# Patient Record
Sex: Male | Born: 1955 | Race: White | Hispanic: No | Marital: Married
Health system: Southern US, Community
[De-identification: ages and names within clinical notes are randomized; demographics above are authoritative.]

## PROBLEM LIST (undated history)

## (undated) DIAGNOSIS — E119 Type 2 diabetes mellitus without complications: Secondary | ICD-10-CM

## (undated) DIAGNOSIS — I10 Essential (primary) hypertension: Secondary | ICD-10-CM

## (undated) DIAGNOSIS — I4891 Unspecified atrial fibrillation: Secondary | ICD-10-CM

## (undated) HISTORY — PX: GASTRIC BYPASS: SHX52

---

## 2019-08-04 ENCOUNTER — Ambulatory Visit: Payer: Self-pay

## 2020-11-08 ENCOUNTER — Inpatient Hospital Stay (HOSPITAL_COMMUNITY): Payer: Medicare (Managed Care)

## 2020-11-08 ENCOUNTER — Encounter (HOSPITAL_COMMUNITY): Payer: Self-pay | Admitting: Pulmonary Disease

## 2020-11-08 ENCOUNTER — Inpatient Hospital Stay (HOSPITAL_COMMUNITY)
Admission: AD | Admit: 2020-11-08 | Discharge: 2020-12-02 | DRG: 853 | Disposition: A | Discharge status: Other Acute Inpatient Hospital | Payer: Medicare (Managed Care) | Source: Other Acute Inpatient Hospital | Attending: Family Medicine | Admitting: Family Medicine

## 2020-11-08 DIAGNOSIS — Z6837 Body mass index (BMI) 37.0-37.9, adult: Secondary | ICD-10-CM

## 2020-11-08 DIAGNOSIS — L8990 Pressure ulcer of unspecified site, unspecified stage: Secondary | ICD-10-CM | POA: Insufficient documentation

## 2020-11-08 DIAGNOSIS — R1011 Right upper quadrant pain: Secondary | ICD-10-CM | POA: Diagnosis not present

## 2020-11-08 DIAGNOSIS — L03115 Cellulitis of right lower limb: Secondary | ICD-10-CM | POA: Diagnosis not present

## 2020-11-08 DIAGNOSIS — L03116 Cellulitis of left lower limb: Secondary | ICD-10-CM | POA: Diagnosis not present

## 2020-11-08 DIAGNOSIS — I120 Hypertensive chronic kidney disease with stage 5 chronic kidney disease or end stage renal disease: Secondary | ICD-10-CM | POA: Diagnosis present

## 2020-11-08 DIAGNOSIS — R7989 Other specified abnormal findings of blood chemistry: Secondary | ICD-10-CM

## 2020-11-08 DIAGNOSIS — K810 Acute cholecystitis: Secondary | ICD-10-CM | POA: Diagnosis not present

## 2020-11-08 DIAGNOSIS — Z9884 Bariatric surgery status: Secondary | ICD-10-CM

## 2020-11-08 DIAGNOSIS — E872 Acidosis: Secondary | ICD-10-CM | POA: Diagnosis present

## 2020-11-08 DIAGNOSIS — R1013 Epigastric pain: Secondary | ICD-10-CM

## 2020-11-08 DIAGNOSIS — G928 Other toxic encephalopathy: Secondary | ICD-10-CM | POA: Diagnosis not present

## 2020-11-08 DIAGNOSIS — Z992 Dependence on renal dialysis: Secondary | ICD-10-CM | POA: Diagnosis not present

## 2020-11-08 DIAGNOSIS — E669 Obesity, unspecified: Secondary | ICD-10-CM | POA: Diagnosis present

## 2020-11-08 DIAGNOSIS — M25461 Effusion, right knee: Secondary | ICD-10-CM | POA: Diagnosis not present

## 2020-11-08 DIAGNOSIS — J449 Chronic obstructive pulmonary disease, unspecified: Secondary | ICD-10-CM | POA: Diagnosis present

## 2020-11-08 DIAGNOSIS — A4151 Sepsis due to Escherichia coli [E. coli]: Principal | ICD-10-CM | POA: Diagnosis present

## 2020-11-08 DIAGNOSIS — R52 Pain, unspecified: Secondary | ICD-10-CM

## 2020-11-08 DIAGNOSIS — F05 Delirium due to known physiological condition: Secondary | ICD-10-CM | POA: Diagnosis not present

## 2020-11-08 DIAGNOSIS — E785 Hyperlipidemia, unspecified: Secondary | ICD-10-CM | POA: Diagnosis present

## 2020-11-08 DIAGNOSIS — K8309 Other cholangitis: Secondary | ICD-10-CM | POA: Diagnosis not present

## 2020-11-08 DIAGNOSIS — M009 Pyogenic arthritis, unspecified: Secondary | ICD-10-CM | POA: Diagnosis not present

## 2020-11-08 DIAGNOSIS — R6521 Severe sepsis with septic shock: Secondary | ICD-10-CM | POA: Diagnosis present

## 2020-11-08 DIAGNOSIS — N186 End stage renal disease: Secondary | ICD-10-CM | POA: Diagnosis present

## 2020-11-08 DIAGNOSIS — M79601 Pain in right arm: Secondary | ICD-10-CM | POA: Diagnosis not present

## 2020-11-08 DIAGNOSIS — K219 Gastro-esophageal reflux disease without esophagitis: Secondary | ICD-10-CM | POA: Diagnosis present

## 2020-11-08 DIAGNOSIS — Z4659 Encounter for fitting and adjustment of other gastrointestinal appliance and device: Secondary | ICD-10-CM

## 2020-11-08 DIAGNOSIS — L89150 Pressure ulcer of sacral region, unstageable: Secondary | ICD-10-CM | POA: Diagnosis not present

## 2020-11-08 DIAGNOSIS — N170 Acute kidney failure with tubular necrosis: Secondary | ICD-10-CM | POA: Diagnosis present

## 2020-11-08 DIAGNOSIS — E1122 Type 2 diabetes mellitus with diabetic chronic kidney disease: Secondary | ICD-10-CM | POA: Diagnosis present

## 2020-11-08 DIAGNOSIS — R7881 Bacteremia: Secondary | ICD-10-CM

## 2020-11-08 DIAGNOSIS — A419 Sepsis, unspecified organism: Secondary | ICD-10-CM | POA: Diagnosis not present

## 2020-11-08 DIAGNOSIS — E1165 Type 2 diabetes mellitus with hyperglycemia: Secondary | ICD-10-CM | POA: Diagnosis present

## 2020-11-08 DIAGNOSIS — Z452 Encounter for adjustment and management of vascular access device: Secondary | ICD-10-CM | POA: Diagnosis not present

## 2020-11-08 DIAGNOSIS — D631 Anemia in chronic kidney disease: Secondary | ICD-10-CM | POA: Diagnosis present

## 2020-11-08 DIAGNOSIS — K8001 Calculus of gallbladder with acute cholecystitis with obstruction: Secondary | ICD-10-CM | POA: Diagnosis not present

## 2020-11-08 DIAGNOSIS — E43 Unspecified severe protein-calorie malnutrition: Secondary | ICD-10-CM | POA: Diagnosis present

## 2020-11-08 DIAGNOSIS — Z7984 Long term (current) use of oral hypoglycemic drugs: Secondary | ICD-10-CM

## 2020-11-08 DIAGNOSIS — D6959 Other secondary thrombocytopenia: Secondary | ICD-10-CM | POA: Diagnosis not present

## 2020-11-08 DIAGNOSIS — R945 Abnormal results of liver function studies: Secondary | ICD-10-CM | POA: Diagnosis not present

## 2020-11-08 DIAGNOSIS — Z91120 Patient's intentional underdosing of medication regimen due to financial hardship: Secondary | ICD-10-CM

## 2020-11-08 DIAGNOSIS — M170 Bilateral primary osteoarthritis of knee: Secondary | ICD-10-CM | POA: Diagnosis present

## 2020-11-08 DIAGNOSIS — Z7901 Long term (current) use of anticoagulants: Secondary | ICD-10-CM

## 2020-11-08 DIAGNOSIS — R0902 Hypoxemia: Secondary | ICD-10-CM | POA: Diagnosis present

## 2020-11-08 DIAGNOSIS — K8000 Calculus of gallbladder with acute cholecystitis without obstruction: Secondary | ICD-10-CM | POA: Diagnosis present

## 2020-11-08 DIAGNOSIS — Z7982 Long term (current) use of aspirin: Secondary | ICD-10-CM

## 2020-11-08 DIAGNOSIS — I4891 Unspecified atrial fibrillation: Secondary | ICD-10-CM | POA: Diagnosis present

## 2020-11-08 DIAGNOSIS — M7989 Other specified soft tissue disorders: Secondary | ICD-10-CM | POA: Diagnosis not present

## 2020-11-08 DIAGNOSIS — Z880 Allergy status to penicillin: Secondary | ICD-10-CM

## 2020-11-08 DIAGNOSIS — E876 Hypokalemia: Secondary | ICD-10-CM | POA: Diagnosis not present

## 2020-11-08 DIAGNOSIS — R748 Abnormal levels of other serum enzymes: Secondary | ICD-10-CM | POA: Diagnosis not present

## 2020-11-08 DIAGNOSIS — M25462 Effusion, left knee: Secondary | ICD-10-CM | POA: Diagnosis not present

## 2020-11-08 DIAGNOSIS — R7401 Elevation of levels of liver transaminase levels: Secondary | ICD-10-CM | POA: Diagnosis present

## 2020-11-08 DIAGNOSIS — Z79899 Other long term (current) drug therapy: Secondary | ICD-10-CM

## 2020-11-08 DIAGNOSIS — E871 Hypo-osmolality and hyponatremia: Secondary | ICD-10-CM | POA: Diagnosis present

## 2020-11-08 DIAGNOSIS — Z87891 Personal history of nicotine dependence: Secondary | ICD-10-CM

## 2020-11-08 DIAGNOSIS — R34 Anuria and oliguria: Secondary | ICD-10-CM | POA: Diagnosis not present

## 2020-11-08 DIAGNOSIS — M79661 Pain in right lower leg: Secondary | ICD-10-CM | POA: Diagnosis not present

## 2020-11-08 DIAGNOSIS — N179 Acute kidney failure, unspecified: Secondary | ICD-10-CM

## 2020-11-08 DIAGNOSIS — T45516A Underdosing of anticoagulants, initial encounter: Secondary | ICD-10-CM | POA: Diagnosis present

## 2020-11-08 HISTORY — DX: Unspecified atrial fibrillation: I48.91

## 2020-11-08 HISTORY — DX: Essential (primary) hypertension: I10

## 2020-11-08 HISTORY — DX: Type 2 diabetes mellitus without complications: E11.9

## 2020-11-08 LAB — GLUCOSE, CAPILLARY
Glucose-Capillary: 149 mg/dL — ABNORMAL HIGH (ref 70–99)
Glucose-Capillary: 133 mg/dL — ABNORMAL HIGH (ref 70–99)
Glucose-Capillary: 139 mg/dL — ABNORMAL HIGH (ref 70–99)
Glucose-Capillary: 156 mg/dL — ABNORMAL HIGH (ref 70–99)

## 2020-11-08 LAB — COMPREHENSIVE METABOLIC PANEL
ALT: 348 U/L — ABNORMAL HIGH (ref 0–44)
AST: 392 U/L — ABNORMAL HIGH (ref 15–41)
Albumin: 3.2 g/dL — ABNORMAL LOW (ref 3.5–5.0)
Alkaline Phosphatase: 137 U/L — ABNORMAL HIGH (ref 38–126)
Anion gap: 14 (ref 5–15)
BUN: 28 mg/dL — ABNORMAL HIGH (ref 8–23)
CO2: 18 mmol/L — ABNORMAL LOW (ref 22–32)
Calcium: 8.4 mg/dL — ABNORMAL LOW (ref 8.9–10.3)
Chloride: 105 mmol/L (ref 98–111)
Creatinine, Ser: 1.99 mg/dL — ABNORMAL HIGH (ref 0.61–1.24)
GFR, Estimated: 36 mL/min — ABNORMAL LOW (ref >60)
Glucose, Bld: 136 mg/dL — ABNORMAL HIGH (ref 70–99)
Potassium: 4.5 mmol/L (ref 3.5–5.1)
Sodium: 137 mmol/L (ref 135–145)
Total Bilirubin: 3.2 mg/dL — ABNORMAL HIGH (ref 0.3–1.2)
Total Protein: 6.5 g/dL (ref 6.5–8.1)

## 2020-11-08 LAB — CBC
HCT: 35.9 % — ABNORMAL LOW (ref 39.0–52.0)
Hemoglobin: 11 g/dL — ABNORMAL LOW (ref 13.0–17.0)
MCH: 26.3 pg (ref 26.0–34.0)
MCHC: 30.6 g/dL (ref 30.0–36.0)
MCV: 85.7 fL (ref 80.0–100.0)
Platelets: 145 10*3/uL — ABNORMAL LOW (ref 150–400)
RBC: 4.19 MIL/uL — ABNORMAL LOW (ref 4.22–5.81)
RDW: 16.4 % — ABNORMAL HIGH (ref 11.5–15.5)
WBC: 17.3 10*3/uL — ABNORMAL HIGH (ref 4.0–10.5)
nRBC: 0 % (ref 0.0–0.2)

## 2020-11-08 LAB — PROTIME-INR
INR: 1.4 — ABNORMAL HIGH (ref 0.8–1.2)
Prothrombin Time: 17.3 seconds — ABNORMAL HIGH (ref 11.4–15.2)

## 2020-11-08 LAB — HEMOGLOBIN A1C
Hgb A1c MFr Bld: 8.3 % — ABNORMAL HIGH (ref 4.8–5.6)
Mean Plasma Glucose: 191.51 mg/dL

## 2020-11-08 LAB — HIV ANTIBODY (ROUTINE TESTING W REFLEX): HIV Screen 4th Generation wRfx: NONREACTIVE

## 2020-11-08 LAB — LACTIC ACID, PLASMA: Lactic Acid, Venous: 4.8 mmol/L (ref 0.5–1.9)

## 2020-11-08 LAB — MRSA PCR SCREENING: MRSA by PCR: NEGATIVE

## 2020-11-08 LAB — LIPASE, BLOOD: Lipase: 79 U/L — ABNORMAL HIGH (ref 11–51)

## 2020-11-08 MED ORDER — LACTATED RINGERS IV SOLN: INTRAVENOUS | Status: DC

## 2020-11-08 MED ORDER — NOREPINEPHRINE 4 MG/250ML-% IV SOLN
2.0000 ug/min | INTRAVENOUS | Status: DC
  Administered 2020-11-08: 10 ug/min via INTRAVENOUS
  Filled 2020-11-08: qty 250

## 2020-11-08 MED ORDER — IOHEXOL 9 MG/ML PO SOLN
ORAL | Status: AC
  Administered 2020-11-08: 500 mL
  Filled 2020-11-08: qty 1000

## 2020-11-08 MED ORDER — SODIUM CHLORIDE 0.9 % IV SOLN: 250.0000 mL | INTRAVENOUS | Status: DC

## 2020-11-08 MED ORDER — ORAL CARE MOUTH RINSE
15.0000 mL | Freq: Two times a day (BID) | OROMUCOSAL | Status: DC
  Administered 2020-11-09 – 2020-11-16 (×12): 15 mL via OROMUCOSAL

## 2020-11-08 MED ORDER — PHENYLEPHRINE CONCENTRATED 100MG/250ML (0.4 MG/ML) INFUSION SIMPLE
0.0000 ug/min | INTRAVENOUS | Status: DC
  Administered 2020-11-08: 170 ug/min via INTRAVENOUS
  Administered 2020-11-09: 320 ug/min via INTRAVENOUS
  Administered 2020-11-09: 133.333 ug/min via INTRAVENOUS
  Administered 2020-11-09: 310 ug/min via INTRAVENOUS
  Administered 2020-11-09: 200 ug/min via INTRAVENOUS
  Administered 2020-11-10: 210 ug/min via INTRAVENOUS
  Filled 2020-11-08 (×10): qty 250

## 2020-11-08 MED ORDER — ACETAMINOPHEN 325 MG PO TABS: 650.0000 mg | ORAL_TABLET | ORAL | Status: DC | PRN

## 2020-11-08 MED ORDER — SODIUM CHLORIDE 0.9 % IV BOLUS
1000.0000 mL | Freq: Once | INTRAVENOUS | Status: AC
  Administered 2020-11-08: 1000 mL via INTRAVENOUS

## 2020-11-08 MED ORDER — AMIODARONE HCL IN DEXTROSE 360-4.14 MG/200ML-% IV SOLN
30.0000 mg/h | INTRAVENOUS | Status: DC
  Administered 2020-11-08 – 2020-11-11 (×6): 30 mg/h via INTRAVENOUS
  Filled 2020-11-08 (×6): qty 200

## 2020-11-08 MED ORDER — LACTATED RINGERS IV BOLUS
500.0000 mL | Freq: Once | INTRAVENOUS | Status: AC
  Administered 2020-11-08: 500 mL via INTRAVENOUS

## 2020-11-08 MED ORDER — SODIUM CHLORIDE 0.9 % IV SOLN
2.0000 g | Freq: Two times a day (BID) | INTRAVENOUS | Status: DC
  Administered 2020-11-08 – 2020-11-10 (×5): 2 g via INTRAVENOUS
  Filled 2020-11-08 (×5): qty 2

## 2020-11-08 MED ORDER — PANTOPRAZOLE SODIUM 40 MG IV SOLR
40.0000 mg | INTRAVENOUS | Status: DC
  Administered 2020-11-08 – 2020-11-14 (×7): 40 mg via INTRAVENOUS
  Filled 2020-11-08 (×8): qty 40

## 2020-11-08 MED ORDER — ONDANSETRON HCL 4 MG/2ML IJ SOLN
4.0000 mg | Freq: Four times a day (QID) | INTRAMUSCULAR | Status: DC | PRN
  Administered 2020-11-09 (×2): 4 mg via INTRAVENOUS
  Filled 2020-11-08 (×2): qty 2

## 2020-11-08 MED ORDER — AMIODARONE HCL IN DEXTROSE 360-4.14 MG/200ML-% IV SOLN
60.0000 mg/h | INTRAVENOUS | Status: DC
  Administered 2020-11-08: 60 mg/h via INTRAVENOUS
  Filled 2020-11-08: qty 200

## 2020-11-08 MED ORDER — DOCUSATE SODIUM 100 MG PO CAPS: 100.0000 mg | ORAL_CAPSULE | Freq: Two times a day (BID) | ORAL | Status: DC | PRN

## 2020-11-08 MED ORDER — NOREPINEPHRINE 4 MG/250ML-% IV SOLN
0.0000 ug/min | INTRAVENOUS | Status: DC
  Filled 2020-11-08: qty 250

## 2020-11-08 MED ORDER — HYDROMORPHONE HCL 1 MG/ML IJ SOLN
0.5000 mg | INTRAMUSCULAR | Status: DC | PRN
  Administered 2020-11-08 – 2020-11-13 (×11): 0.5 mg via INTRAVENOUS
  Filled 2020-11-08 (×11): qty 0.5

## 2020-11-08 MED ORDER — ACETAMINOPHEN 650 MG RE SUPP: 650.0000 mg | RECTAL | Status: DC | PRN

## 2020-11-08 MED ORDER — METRONIDAZOLE 500 MG/100ML IV SOLN
500.0000 mg | Freq: Three times a day (TID) | INTRAVENOUS | Status: DC
  Administered 2020-11-08 – 2020-11-10 (×7): 500 mg via INTRAVENOUS
  Filled 2020-11-08 (×7): qty 100

## 2020-11-08 MED ORDER — PHENYLEPHRINE HCL-NACL 10-0.9 MG/250ML-% IV SOLN
25.0000 ug/min | INTRAVENOUS | Status: DC
  Administered 2020-11-08: 25 ug/min via INTRAVENOUS
  Administered 2020-11-08: 13.333 ug/min via INTRAVENOUS
  Filled 2020-11-08 (×3): qty 250

## 2020-11-08 MED ORDER — POLYETHYLENE GLYCOL 3350 17 G PO PACK: 17.0000 g | PACK | Freq: Every day | ORAL | Status: DC | PRN

## 2020-11-08 MED ORDER — ACETAMINOPHEN 325 MG PO TABS: 650.0000 mg | ORAL_TABLET | Freq: Four times a day (QID) | ORAL | Status: DC | PRN

## 2020-11-08 MED ORDER — INSULIN ASPART 100 UNIT/ML IJ SOLN
0.0000 [IU] | INTRAMUSCULAR | Status: DC
  Administered 2020-11-08 (×2): 2 [IU] via SUBCUTANEOUS
  Administered 2020-11-09: 1 [IU] via SUBCUTANEOUS
  Administered 2020-11-09 (×3): 2 [IU] via SUBCUTANEOUS
  Administered 2020-11-09: 3 [IU] via SUBCUTANEOUS
  Administered 2020-11-09 – 2020-11-11 (×4): 2 [IU] via SUBCUTANEOUS
  Administered 2020-11-11: 1 [IU] via SUBCUTANEOUS
  Administered 2020-11-11 – 2020-11-14 (×7): 2 [IU] via SUBCUTANEOUS
  Administered 2020-11-14 (×2): 3 [IU] via SUBCUTANEOUS
  Administered 2020-11-14 – 2020-11-15 (×4): 2 [IU] via SUBCUTANEOUS
  Administered 2020-11-15: 3 [IU] via SUBCUTANEOUS
  Administered 2020-11-15: 2 [IU] via SUBCUTANEOUS
  Administered 2020-11-15: 3 [IU] via SUBCUTANEOUS
  Administered 2020-11-16: 2 [IU] via SUBCUTANEOUS
  Administered 2020-11-16 (×2): 3 [IU] via SUBCUTANEOUS
  Administered 2020-11-16 (×2): 5 [IU] via SUBCUTANEOUS
  Administered 2020-11-16 – 2020-11-17 (×3): 3 [IU] via SUBCUTANEOUS
  Administered 2020-11-17: 5 [IU] via SUBCUTANEOUS
  Administered 2020-11-17: 3 [IU] via SUBCUTANEOUS
  Administered 2020-11-17: 5 [IU] via SUBCUTANEOUS
  Administered 2020-11-17 – 2020-11-18 (×5): 3 [IU] via SUBCUTANEOUS
  Administered 2020-11-18: 2 [IU] via SUBCUTANEOUS
  Administered 2020-11-18: 3 [IU] via SUBCUTANEOUS

## 2020-11-08 MED ORDER — AMIODARONE LOAD VIA INFUSION
150.0000 mg | Freq: Once | INTRAVENOUS | Status: AC
  Administered 2020-11-08: 150 mg via INTRAVENOUS
  Filled 2020-11-08: qty 83.34

## 2020-11-08 MED ORDER — CHLORHEXIDINE GLUCONATE 0.12 % MT SOLN
15.0000 mL | Freq: Two times a day (BID) | OROMUCOSAL | Status: DC
  Administered 2020-11-08 – 2020-11-16 (×13): 15 mL via OROMUCOSAL
  Filled 2020-11-08 (×4): qty 15

## 2020-11-08 MED ORDER — FENTANYL CITRATE (PF) 100 MCG/2ML IJ SOLN: 50.0000 ug | Freq: Once | INTRAMUSCULAR | Status: DC | PRN

## 2020-11-08 NOTE — Consult Note (Signed)
[facility] Consult: 2:08 PM 11/08/2020  LOS: 0 days    Referring Provider: Dr Silas Flood MD Primary Care Physician: Delynn Flavin DO, Tyrone Sage MD in [place]. Primary Gastroenterologist: Althia Forts    Reason for Consultation:  Choledocholithiasis.     HPI: Tom Scott is a 65 y.o. male.  PMH obesity.  DM 2.  GERD.  Tracheobronchitis.  Hypertension.  A. Fib.  COPD, asthma.  Bariatric gastric bypass (Roux-en Y?)  In [place] ~15 to 20 years ago. Previously on Xarelto, stopped March 2022 due to unable to afford.  But now takes 162 mg aspirin daily. No previous EGD, colonoscopy or known GB disease.    Transferred from hospital in [facility] with sepsis picture.  Presented to the ED with epigastric pain for several hours prior to arrival.  Nausea without emesis.  A. fib with RVR.  Hypoxia requiring up to 10 L nasal oxygen.  Hypotensive.   WBCs 11.6.  Hgb 11.6.  MCV 85.  Platelets 237.  INR 1.2 Sodium 134.  BUN/creatinine 23/1. BNP 4989 >> 252.   T bili 1.2 >> 3.  Alk phos 79 >> 137.  AST/ALT 85/35>> 517/314.    Lipase 21.   CXR with congestive heart failure. CTAP w/o contrast showed changes from previous gastric surgery.  Cholelithiasis, no cholecystitis.  Unremarkable liver.  No ductal dilatation. Patient says he had ultrasound of abdomen and this apparently raise concern for choledocholithiasis.  However the transfer paperwork has no ultrasound results.  For 2 weeks pt having intermittent epigastric discomfort but it only lasts a minute or 2.  Not related to eating or fasting, no particular pattern.  No radiation Since arrival in the ED he has had dry heaves but no vomiting.  Last bowel movement was brown, yesterday.  Does not use NSAIDs, just half of a full dose aspirin  daily.   Currently on metronidazole, Levophed, amiodarone IV  No alcohol.  Fm Hx: Breast cancer in his mother.  No family history of ulcer disease, gallbladder disease, liver disease, colorectal cancer.   Prior to Admission medications   Not on File    Scheduled Meds: . amiodarone  150 mg Intravenous Once  . insulin aspart  0-15 Units Subcutaneous Q4H   Infusions: . sodium chloride    . amiodarone     Followed by  . amiodarone    . lactated ringers    . metronidazole    . norepinephrine (LEVOPHED) Adult infusion     PRN Meds: acetaminophen, docusate sodium, ondansetron (ZOFRAN) IV, polyethylene glycol   Allergies as of 11/08/2020  . (Not on File)    No family history on file.  Social History   Socioeconomic History  . Marital status: Married    Spouse name: Not on file  . Number of children: Not on file  . Years of education: Not on file  . Highest education level: Not on file  Occupational History  . Not on file  Tobacco Use  . Smoking status: Not on file  . Smokeless tobacco: Not on  file  Substance and Sexual Activity  . Alcohol use: Not on file  . Drug use: Not on file  . Sexual activity: Not on file  Other Topics Concern  . Not on file  Social History Narrative  . Not on file   Social Determinants of Health   Financial Resource Strain: Not on file  Food Insecurity: Not on file  Transportation Needs: Not on file  Physical Activity: Not on file  Stress: Not on file  Social Connections: Not on file  Intimate Partner Violence: Not on file    REVIEW OF SYSTEMS: Constitutional: Feels tired ENT:  No nose bleeds Pulm: Short of breath periodically.  Not currently profoundly short of breath. CV:  No palpitations, no LE edema.  No angina.   GU:  No hematuria, no frequency GI: See HPI Heme: Denies unusual bleeding or bruising.  Does develop purpura on his forearms readily. Transfusions: No previous blood transfusions. Neuro:  No headaches, no  peripheral tingling or numbness.  No syncope, no seizures. Derm:  No itching, no rash or sores.  Endocrine:  No sweats or chills.  No polyuria or dysuria Immunization: Not queried   PHYSICAL EXAM: Vital signs in last 24 hours: There were no vitals filed for this visit. Wt Readings from Last 3 Encounters:  No data found for Wt    General: Patient is moderately ill-appearing.  Not obviously jaundiced.  Resting comfortably on bed. Head: No facial asymmetry or swelling.  No signs of head trauma. Eyes: No scleral icterus.  Conjunctiva pale. Ears: Not hard of hearing Nose: No discharge or congestion Mouth: Dry tongue.  Mucosa pink, clear.  Fair dentition. Neck: No JVD, masses, thyromegaly Lungs: No labored breathing or cough. Heart: RRR.  Rate in 150s Abdomen: Soft.  Active bowel sounds.  Obese.  Tenderness without guarding or rebound across the upper abdomen, most pronounced in the epigastric region.  No organomegaly, bruits, hernias appreciated.   Rectal: Deferred. Musc/Skeltl: No joint redness, swelling or gross deformity. Extremities: No CCE. Neurologic: Alert.  Oriented x3.  Appropriate.  Moves all 4 limbs without tremor, strength not tested Skin: No jaundice, no telangiectasia. Nodes: No cervical adenopathy Psych: Calm, cooperative.  Intake/Output from previous day: No intake/output data recorded. Intake/Output this shift: No intake/output data recorded.  LAB RESULTS: No results for input(s): WBC, HGB, HCT, PLT in the last 72 hours. BMET No results found for: NA, K, CL, CO2, GLUCOSE, BUN, CREATININE, CALCIUM LFT No results for input(s): PROT, ALBUMIN, AST, ALT, ALKPHOS, BILITOT, BILIDIR, IBILI in the last 72 hours. PT/INR No results found for: INR, PROTIME Hepatitis Panel No results for input(s): HEPBSAG, HCVAB, HEPAIGM, HEPBIGM in the last 72 hours. C-Diff No components found for: CDIFF Lipase  No results found for: LIPASE  Drugs of Abuse  No results found for:  LABOPIA, COCAINSCRNUR, LABBENZ, AMPHETMU, THCU, LABBARB   RADIOLOGY STUDIES: No results found.    IMPRESSION:   *  Epigastric pain.   Elevated LFTs.  Simple cholelithiasis per non-con CT. R/o symptomatic cholelithiasis.  Rule out PUD.  *   Rapid A. Fib.  Hypoxia.   CHF per CXR in [place].    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  *    DM.  *   COVID-19 testing negative in [place].    PLAN:     *   Abdominal ultrasound ordered along with repeat CMET, CBC, PT/INR, A1c, lipase.  *   Added Protonix 40  IV daily.  Cefepime added to Flagyl.     Azucena Freed  11/08/2020, 2:08 PM Phone [phone]

## 2020-11-08 NOTE — Progress Notes (Signed)
Abdominal X-ray to confirm NGT placement recommended advancing several cm deeper into stomach. Advanced NGT ~ 4 cm as recommended by radiology. Verified at bedside with air.   Antonieta Pert, RN  [phone] 11/08/2020

## 2020-11-08 NOTE — Progress Notes (Signed)
Pharmacy Antibiotic Note  Tom Scott is a 65 y.o. male transferred to [facility] on 11/08/2020 with cholelithiasis.  Pharmacy has been consulted for cefepime dosing.  Also on Flagyl.  Labs from OSH - SCr 1.3, CrCL 57 ml/min, WBC WNL  Plan: Cefepime 2gm IV Q12H Flagyl '500mg'$  IV Q8H per MD Monitor renal function, clinical progress    Temp (24hrs), Avg:98.8 F (37.1 C), Min:98.8 F (37.1 C), Max:98.8 F (37.1 C)  No results for input(s): WBC, CREATININE, LATICACIDVEN, VANCOTROUGH, VANCOPEAK, VANCORANDOM, GENTTROUGH, GENTPEAK, GENTRANDOM, TOBRATROUGH, TOBRAPEAK, TOBRARND, AMIKACINPEAK, AMIKACINTROU, AMIKACIN in the last 168 hours.  CrCl cannot be calculated (No successful lab value found.).    Allergies  Allergen Reactions  . Penicillins     LVQ PTA  Flagyl 5/19 >>  Cefepime 5/19 >>  Graycen Sadlon D. Mina Marble, PharmD, BCPS, [place] 11/08/2020, 2:58 PM

## 2020-11-08 NOTE — Procedures (Signed)
Central Venous Catheter Insertion Procedure Note  Tom Scott  [id number]  [birth year]  Date:11/08/20  Time:5:15 PM   Provider Performing:Dianara Smullen R Katerine Morua   Procedure: Insertion of Non-tunneled Central Venous [phone]) with US guidance [id number])   Indication(s) Medication administration  Consent Risks of the procedure as well as the alternatives and risks of each were explained to the patient and/or caregiver.  Consent for the procedure was obtained and is signed in the bedside chart  Anesthesia Topical only with 1% lidocaine   Timeout Verified patient identification, verified procedure, site/side was marked, verified correct patient position, special equipment/implants available, medications/allergies/relevant history reviewed, required imaging and test results available.  Sterile Technique Maximal sterile technique including full sterile barrier drape, hand hygiene, sterile gown, sterile gloves, mask, hair covering, sterile ultrasound probe cover (if used).  Procedure Description Area of catheter insertion was cleaned with chlorhexidine and draped in sterile fashion.  With real-time ultrasound guidance a central venous catheter was placed into the right internal jugular vein. Nonpulsatile blood flow and easy flushing noted in all ports.  The catheter was sutured in place and sterile dressing applied.  Complications/Tolerance None; patient tolerated the procedure well. Chest X-ray is ordered to verify placement for internal jugular or subclavian cannulation.   Chest x-ray is not ordered for femoral cannulation.  EBL Minimal  Specimen(s) None

## 2020-11-08 NOTE — Consult Note (Signed)
[facility] Surgery Consult Note  Tom Scott 08/26/1955  [id number].    Requesting MD: Chesley Mires Chief Complaint/Reason for Consult: abdominal pain, ?choledocholithiasis  HPI:  Tom Scott is a 65yo male PMH HTN, DM, HLD, atrial fibrillation (off xarelto since 08/2020, takes ASA 162.37m qd) [facility]. Patient states that about 2 weeks ago he developed intermittent abdominal pain. The pain became more severe and constant 3 days ago and he went to the ED yesterday. He also reports nausea as well as chills. No fever, emesis.  Patient comes with a paper chart that seems incomplete. He reportedly had an u/s but I cannot find the results. CTA reports gallstones, no signs of cholecystitis. Initial LFTs: AST 85, ALT 35, Alk phos 79, Tbili 1.2. LFTs repeated this morning and rose to AST 517, ALT 314, Alk phos 137, TBili 3. Lipase 21. He was noted to be in rapid atrial fibrillation with hypotension. [facility] He is being started on pressor support. General surgery asked to see.  Abdominal surgical history: gastric bypass ~15 years ago for weight loss Nonsmoker Denies alcohol or illicit drug use Lives at home with his wife Ambulates without assistive device Employment: [employer]  Allergies: penicillin (causes lip swelling)  Review of Systems  Constitutional: Positive for chills.  Respiratory: Negative.   Cardiovascular: Positive for chest pain.  Gastrointestinal: Positive for abdominal pain and nausea. Negative for vomiting.   All systems reviewed and otherwise negative except for as above  No family history on file.  Past Medical History:  Diagnosis Date  . [place]   . Diabetes mellitus ([place]   . Hypertension     Past Surgical History:  Procedure Laterality Date  . GASTRIC BYPASS      Social History:  has no history on file  for tobacco use, alcohol use, and drug use.  Allergies:  Allergies  Allergen Reactions  . Penicillins     No medications prior to admission.    Prior to Admission medications   Not on File    Blood pressure 102/60, pulse (!) 131, temperature 98.8 F (37.1 C), temperature source Oral, resp. rate (!) 28, SpO2 98 %. Physical Exam: General: pleasant, WD/WN male who is laying in bed in NAD HEENT: head is normocephalic, atraumatic.  Sclera are noninjected.  Pupils equal and round.  Ears and nose without any masses or lesions.  Mouth is pink and moist. Dentition fair Heart: tachycardic up to 170s, irregular rhythm  No obvious murmurs, gallops, or rubs noted.  Palpable pedal pulses bilaterally  Lungs: distant breath sounds bilateral bases, CTAB upper lung fields, no wheezes, rhonchi, or rales noted.  Respiratory effort nonlabored Abd: soft, protuberant, ND, no masses, hernias, or organomegaly. Diffusely tender in the upper abdomen with guarding MS: no BUE/BLE edema, calves soft and nontender Skin: warm and dry with no masses, lesions, or rashes Psych: A&Ox4 with an appropriate affect Neuro: cranial nerves grossly intact, equal strength in BUE/BLE bilaterally, normal speech, thought process intact  Results for orders placed or performed during the hospital encounter of 11/08/20 (from the past 48 hour(s))  Glucose, capillary     Status: Abnormal   Collection Time: 11/08/20  1:26 PM  Result Value Ref Range   Glucose-Capillary 149 (H) 70 - 99 mg/dL    Comment: Glucose reference range applies only to samples taken after fasting for at least 8 hours.  No results found.   Anti-infectives (From admission, onward)   Start     Dose/Rate Route Frequency Ordered Stop   11/08/20 1600  ceFEPIme (MAXIPIME) 2 g in sodium chloride 0.9 % 100 mL IVPB        2 g 200 mL/hr over 30 Minutes Intravenous Every 12 hours 11/08/20 1456     11/08/20 1500  metroNIDAZOLE (FLAGYL) IVPB 500 mg        500 mg 100  mL/hr over 60 Minutes Intravenous Every 8 hours 11/08/20 1404         Assessment/Plan HTN DM HLD Atrial fibrillation with RVR - off xarelto since 08/2020, takes ASA 162.12m qd Hx gastric bypass ~15 years ago  Sepsis Epigastric pain Cholelithiasis Elevated LFTs - Patient with 2 weeks of progressive epigastric abdominal pain. Unable to see CT images from outside facility, and do not even see a report for any abdominal u/s. Will start with plain film to ensure no free air given guarding on exam. Abdominal u/s, CBC, CMP, lipase also ordered. Resuscitation per CCM. Gastroenterology on board if he truly does have choledocholithiasis and possible ascending cholangitis. Broad spectrum antibiotics are being started. We will follow.   ID - cefepime/flagyl 5/19>> VTE - SCDs FEN - IVF, NPO Foley - none Follow up - TBD  [facility] 11/08/2020, 2:45 PM Please see Amion for pager number during day hours 7:00am-4:30pm

## 2020-11-08 NOTE — Progress Notes (Addendum)
eLink Physician-Brief Progress Note Patient Name: Tom Scott DOB: 1956-01-15 MRN: [id number]   Date of Service  11/08/2020  HPI/Events of Note  Lactic Acid = 4.8. Last Hgb = 11.0.  eICU Interventions  Plan: 1. Bolus with 0.9 NaCl 1 liter IV over 1 hour now. 2. Monitor CVP now and Q 4 hours.      Intervention Category Major Interventions: Acid-Base disturbance - evaluation and management  Glendal Cassaday Cornelia Copa 11/08/2020, 9:35 PM

## 2020-11-08 NOTE — H&P (Signed)
NAME:  Tom Scott, MRN:  [id number], DOB:  1956-05-15, LOS: 0 ADMISSION DATE:  11/08/2020, CONSULTATION DATE: 11/08/2020  CHIEF COMPLAINT:  Septic shock   History of Present Illness:   13 yoM with a history of atrial fibrillation on xarelto (not taking since 3/22), hypertension, DM, and HLD who initially presented to [facility] in [place] for chest and epigastric pain. Transferred to [facility] due to septic shock and concerns for choledocholithiasis.   He reports 2 weeks of intermittent abdominal pain that worsened 3 days ago. Endorses nausea, denies emesis, fevers, diarrhea. Sign out prior to transfer expressed concern for choledocholithiasis. On arrival, documentaion included a CT abdomen with cholelithiasis no cholecystitis. He also had lactic acidosis, transaminitis and an elevated t bili. He was also with atrial fibrillation with RVR and hypotensive started on pressors. Of note, he stopped taking Xarelto in March 2022 secondary to finances and has been taking aspirin. He   Past Medical History:  Atrial fibrillation HTN DM HLD Hx of gastric bypass   Significant Hospital Events:  - 5/19 admitted to [facility] ICU  Consults:  GI General surgery  Procedures:  - central venous catheter   Significant Diagnostic Tests:  Outside CT abdomen shows cholelithiasis without evidence of acute cholecystitis.   Reportedly had an abdominal US with concern for choledocholithiasis but no record of this  Micro Data:  BC from [facility] and repeated on admission  Antimicrobials:  Metronidazole 5/18> Cefepime> Levaquin 5/18   Interim History / Subjective:   Patient presents from [place] surgery today. Awake and alert, endorsing abdominal and back pain.    Objective   Blood pressure 102/60, pulse (!) 131, temperature 98.8 F (37.1 C), temperature source Oral, resp. rate (!) 28, SpO2 98 %.       No intake or output data in the 24 hours ending 11/08/20  1447 There were no vitals filed for this visit.  Examination: General: elderly male, acutely ill appearing, appears uncomfortable HENT: antiicteric sclerae, dry membranes, on room air  Lungs: CTA bilaterally, normal work of breathing Cardiovascular: s1, s2, tachycardic, no murmurs rubs or gallops Abdomen: soft, bowel sounds present, tenderness to palpation in upper quadrants Extremities: no edema Neuro: alert and oriented x3, following commands Skin: warm and dry  Resolved Hospital Problem list   none  Assessment & Plan:   Septic shock Cholelithiasis, concern for Choledocholithiasis  Epigastric pain Transaminitis - Requiring pressor support, levophed and phenylephrine - Started on flagyl and given one dose of Levaquin prior to admission at [facility] - Will give IVF boluses to facilitate weaning from pressor support - No evidence of acute cholecystis, reported concern for choledocholithiasis  - Transaminitis, tbili 1.2 - Will consult GI and general surgery, appreciate input - Follow up abdominal US, CMP, lipase  Afib w/ RVR - Likely secondary to sepsis  - Amiodarone bolus follow with infusion  - Not taking xarelto since 3/22 secondary to finances, taking aspirin  - Continue telemetry  - Start heparin infusion once procedural plans discussed with GI/Surgery  HTN - Hold home antihypertensives  - Pressor support as above  DM - SSI  - CBG monitoring  HLD - Continue statin once home meds are confirmed      Best practice (evaluated daily)  Diet: NPO Pain/Anxiety/Delirium protocol (if indicated): n/a VAP protocol (if indicated): n/a DVT prophylaxis: SCDs, plan for heparin once procedure plan determined GI prophylaxis: PPI Glucose control: SSI Mobility: bedrest Disposition: ICU  Goals of Care:  Last date  of multidisciplinary goals of care discussion:5/19 Family and staff present: Yes Summary of discussion: continue current plan of care Follow up goals of care  discussion due: n/a Code Status: Full code  Labs   CBC: No results for input(s): WBC, NEUTROABS, HGB, HCT, MCV, PLT in the last 168 hours.  Basic Metabolic Panel: No results for input(s): NA, K, CL, CO2, GLUCOSE, BUN, CREATININE, CALCIUM, MG, PHOS in the last 168 hours. GFR: CrCl cannot be calculated (No successful lab value found.). No results for input(s): PROCALCITON, WBC, LATICACIDVEN in the last 168 hours.  Liver Function Tests: No results for input(s): AST, ALT, ALKPHOS, BILITOT, PROT, ALBUMIN in the last 168 hours. No results for input(s): LIPASE, AMYLASE in the last 168 hours. No results for input(s): AMMONIA in the last 168 hours.  ABG No results found for: PHART, PCO2ART, PO2ART, HCO3, TCO2, ACIDBASEDEF, O2SAT   Coagulation Profile: No results for input(s): INR, PROTIME in the last 168 hours.  Cardiac Enzymes: No results for input(s): CKTOTAL, CKMB, CKMBINDEX, TROPONINI in the last 168 hours.  HbA1C: No results found for: HGBA1C  CBG: Recent Labs  Lab 11/08/20 1326  GLUCAP 149*     Past Medical History:  He,  has a past medical history of Atrial fibrillation ([place]), Diabetes mellitus ([place]), and Hypertension.   Surgical History:   Past Surgical History:  Procedure Laterality Date  . GASTRIC BYPASS       Social History:      Family History:  His family history is not on file.   Allergies Allergies  Allergen Reactions  . Penicillins      Home Medications  Prior to Admission medications   Not on [place], DO PGY-2 IMTS

## 2020-11-09 ENCOUNTER — Inpatient Hospital Stay (HOSPITAL_COMMUNITY): Payer: Medicare (Managed Care)

## 2020-11-09 ENCOUNTER — Encounter (HOSPITAL_COMMUNITY): Payer: Self-pay | Admitting: Pulmonary Disease

## 2020-11-09 DIAGNOSIS — R1013 Epigastric pain: Secondary | ICD-10-CM | POA: Diagnosis not present

## 2020-11-09 DIAGNOSIS — R748 Abnormal levels of other serum enzymes: Secondary | ICD-10-CM | POA: Diagnosis not present

## 2020-11-09 DIAGNOSIS — R6521 Severe sepsis with septic shock: Secondary | ICD-10-CM | POA: Diagnosis not present

## 2020-11-09 DIAGNOSIS — A419 Sepsis, unspecified organism: Secondary | ICD-10-CM | POA: Diagnosis not present

## 2020-11-09 HISTORY — PX: IR PERC CHOLECYSTOSTOMY: IMG2326

## 2020-11-09 LAB — HEPATIC FUNCTION PANEL
ALT: 263 U/L — ABNORMAL HIGH (ref 0–44)
AST: 200 U/L — ABNORMAL HIGH (ref 15–41)
Albumin: 2.6 g/dL — ABNORMAL LOW (ref 3.5–5.0)
Alkaline Phosphatase: 103 U/L (ref 38–126)
Bilirubin, Direct: 2.3 mg/dL — ABNORMAL HIGH (ref 0.0–0.2)
Indirect Bilirubin: 1.2 mg/dL — ABNORMAL HIGH (ref 0.3–0.9)
Total Bilirubin: 3.5 mg/dL — ABNORMAL HIGH (ref 0.3–1.2)
Total Protein: 5.6 g/dL — ABNORMAL LOW (ref 6.5–8.1)

## 2020-11-09 LAB — CBC
HCT: 32.8 % — ABNORMAL LOW (ref 39.0–52.0)
Hemoglobin: 10.1 g/dL — ABNORMAL LOW (ref 13.0–17.0)
MCH: 26.8 pg (ref 26.0–34.0)
MCHC: 30.8 g/dL (ref 30.0–36.0)
MCV: 87 fL (ref 80.0–100.0)
Platelets: 100 10*3/uL — ABNORMAL LOW (ref 150–400)
RBC: 3.77 MIL/uL — ABNORMAL LOW (ref 4.22–5.81)
RDW: 16.9 % — ABNORMAL HIGH (ref 11.5–15.5)
WBC: 22.5 10*3/uL — ABNORMAL HIGH (ref 4.0–10.5)
nRBC: 0 % (ref 0.0–0.2)

## 2020-11-09 LAB — GLUCOSE, CAPILLARY
Glucose-Capillary: 106 mg/dL — ABNORMAL HIGH (ref 70–99)
Glucose-Capillary: 121 mg/dL — ABNORMAL HIGH (ref 70–99)
Glucose-Capillary: 121 mg/dL — ABNORMAL HIGH (ref 70–99)
Glucose-Capillary: 122 mg/dL — ABNORMAL HIGH (ref 70–99)
Glucose-Capillary: 124 mg/dL — ABNORMAL HIGH (ref 70–99)
Glucose-Capillary: 124 mg/dL — ABNORMAL HIGH (ref 70–99)
Glucose-Capillary: 130 mg/dL — ABNORMAL HIGH (ref 70–99)
Glucose-Capillary: 152 mg/dL — ABNORMAL HIGH (ref 70–99)

## 2020-11-09 LAB — LACTIC ACID, PLASMA: Lactic Acid, Venous: 4.5 mmol/L (ref 0.5–1.9)

## 2020-11-09 LAB — BASIC METABOLIC PANEL
Anion gap: 12 (ref 5–15)
BUN: 35 mg/dL — ABNORMAL HIGH (ref 8–23)
CO2: 15 mmol/L — ABNORMAL LOW (ref 22–32)
Calcium: 7.4 mg/dL — ABNORMAL LOW (ref 8.9–10.3)
Chloride: 105 mmol/L (ref 98–111)
Creatinine, Ser: 2.38 mg/dL — ABNORMAL HIGH (ref 0.61–1.24)
GFR, Estimated: 29 mL/min — ABNORMAL LOW (ref >60)
Glucose, Bld: 147 mg/dL — ABNORMAL HIGH (ref 70–99)
Potassium: 4.8 mmol/L (ref 3.5–5.1)
Sodium: 132 mmol/L — ABNORMAL LOW (ref 135–145)

## 2020-11-09 LAB — LIPASE, BLOOD: Lipase: 27 U/L (ref 11–51)

## 2020-11-09 MED ORDER — SODIUM CHLORIDE 0.9% FLUSH
5.0000 mL | Freq: Three times a day (TID) | INTRAVENOUS | Status: DC
  Administered 2020-11-09 – 2020-11-29 (×51): 5 mL

## 2020-11-09 MED ORDER — FENTANYL CITRATE (PF) 100 MCG/2ML IJ SOLN
INTRAMUSCULAR | Status: AC
  Filled 2020-11-09: qty 2

## 2020-11-09 MED ORDER — LIDOCAINE HCL (PF) 1 % IJ SOLN
INTRAMUSCULAR | Status: AC | PRN
  Administered 2020-11-09: 20 mL

## 2020-11-09 MED ORDER — LIDOCAINE HCL (PF) 1 % IJ SOLN
INTRAMUSCULAR | Status: AC
  Filled 2020-11-09: qty 30

## 2020-11-09 MED ORDER — SODIUM CHLORIDE 0.9% FLUSH
10.0000 mL | Freq: Two times a day (BID) | INTRAVENOUS | Status: DC
Start: 2020-11-09 — End: 2020-12-02
  Administered 2020-11-09 – 2020-12-01 (×35): 10 mL

## 2020-11-09 MED ORDER — SODIUM CHLORIDE 0.9% FLUSH
10.0000 mL | INTRAVENOUS | Status: DC | PRN
  Administered 2020-11-21: 10 mL

## 2020-11-09 MED ORDER — IOHEXOL 300 MG/ML  SOLN
50.0000 mL | Freq: Once | INTRAMUSCULAR | Status: AC | PRN
  Administered 2020-11-09: 5 mL

## 2020-11-09 MED ORDER — CHLORHEXIDINE GLUCONATE CLOTH 2 % EX PADS
6.0000 | MEDICATED_PAD | Freq: Every day | CUTANEOUS | Status: DC
  Administered 2020-11-09 – 2020-11-12 (×4): 6 via TOPICAL

## 2020-11-09 MED ORDER — SODIUM CHLORIDE 0.9 % IV BOLUS
1000.0000 mL | Freq: Once | INTRAVENOUS | Status: AC
  Administered 2020-11-09: 1000 mL via INTRAVENOUS

## 2020-11-09 MED ORDER — FENTANYL CITRATE (PF) 100 MCG/2ML IJ SOLN
INTRAMUSCULAR | Status: AC | PRN
  Administered 2020-11-09: 50 ug via INTRAVENOUS

## 2020-11-09 MED ORDER — MIDAZOLAM HCL 2 MG/2ML IJ SOLN
INTRAMUSCULAR | Status: AC | PRN
  Administered 2020-11-09: 1 mg via INTRAVENOUS

## 2020-11-09 MED ORDER — HEPARIN (PORCINE) 25000 UT/250ML-% IV SOLN
3050.0000 [IU]/h | INTRAVENOUS | Status: AC
  Administered 2020-11-09: 1300 [IU]/h via INTRAVENOUS
  Administered 2020-11-11: 1900 [IU]/h via INTRAVENOUS
  Administered 2020-11-11: 2350 [IU]/h via INTRAVENOUS
  Administered 2020-11-12 (×2): 2550 [IU]/h via INTRAVENOUS
  Administered 2020-11-13: 2700 [IU]/h via INTRAVENOUS
  Administered 2020-11-13: 2750 [IU]/h via INTRAVENOUS
  Administered 2020-11-14: 2900 [IU]/h via INTRAVENOUS
  Administered 2020-11-14: 2950 [IU]/h via INTRAVENOUS
  Administered 2020-11-14: 2900 [IU]/h via INTRAVENOUS
  Administered 2020-11-15: 3050 [IU]/h via INTRAVENOUS
  Filled 2020-11-09 (×12): qty 250

## 2020-11-09 MED ORDER — MIDAZOLAM HCL 2 MG/2ML IJ SOLN
INTRAMUSCULAR | Status: AC
  Filled 2020-11-09: qty 4

## 2020-11-09 NOTE — Progress Notes (Signed)
Note ultrasound findings of acute cholecystitis.  No CBD stones or ductal dilatation. No role for MRCP or ERCP. Surgery on board.  Plan perc chole now, lap chole in several weeks GI signing off.    Azucena Freed PA-C

## 2020-11-09 NOTE — Progress Notes (Signed)
ANTICOAGULATION CONSULT NOTE - Initial Consult  Pharmacy Consult for heparin Indication: atrial fibrillation  Allergies  Allergen Reactions  . Penicillamine     Other reaction(s): lips swell  . Penicillins     Other reaction(s): Swelling  Legacy System: CCA Onset Date: <blank> Substance Legacy/Cerner: penicillins / penicillins (Legacy value) Category: Drug Severity Legacy/Cerner: <blank> / Unknown Reaction(s): swelling Comments: <blank>  Legacy System: CCA Onset Date: <blank> Substance Legacy/Cerner: ampicillin / ampicillin (Legacy value) Category: Drug Severity Legacy/Cerner: <blank> / Unknown Reaction(s): swelling Comments: <blank>  Legacy System: CCA Onset Date: <blank> Substance Legacy/Cerner: ampicillin / ampicillin (Legacy value) Category: Drug Severity Legacy/Cerner: <blank> / Unknown Reaction(s): swelling Comments: <blank>  Legacy System: CCA Onset Date: <blank> Substance Legacy/Cerner: penicillins / penicillins (Legacy value) Category: Drug Severity Legacy/Cerner: <blank> / Unknown Reaction(s): swelling Comments: <blank>     Patient Measurements: Height: '5\' 10"'$  (177.8 cm) Weight: 98 kg (216 lb 0.8 oz) IBW/kg (Calculated) : 73 Heparin Dosing Weight: 93.3 kg  Vital Signs: Temp: 99.5 F (37.5 C) (05/20 1533) Temp Source: Axillary (05/20 1533) BP: 104/75 (05/20 1545) Pulse Rate: 116 (05/20 1545)  Labs: Recent Labs    11/08/20 1426 11/09/20 0358  HGB 11.0* 10.1*  HCT 35.9* 32.8*  PLT 145* 100*  LABPROT 17.3*  --   INR 1.4*  --   CREATININE 1.99* 2.38*    Estimated Creatinine Clearance: 35.4 mL/min (A) (by C-G formula based on SCr of 2.38 mg/dL (H)).   Medical History: Past Medical History:  Diagnosis Date  . Atrial fibrillation ([place])   . Diabetes mellitus ([place])   . Hypertension     Medications:  Scheduled:  . chlorhexidine  15 mL Mouth Rinse BID  . Chlorhexidine Gluconate Cloth  6 each Topical Daily  . insulin aspart  0-15  Units Subcutaneous Q4H  . mouth rinse  15 mL Mouth Rinse q12n4p  . pantoprazole (PROTONIX) IV  40 mg Intravenous Q24H  . sodium chloride flush  10-40 mL Intracatheter Q12H  . sodium chloride flush  5 mL Intracatheter Q8H    Assessment: 48 yof presenting with cholelithiasis - now found to be in Afib with RVR.  Underwent perc cholecystomy today - was on Xarelto PTA (but off since March d/t cost).   Hgb 10.1, plt 100. Given procedure has standard bleed risk, will start heparin 6 hours after. No s/sx of bleeding.   Goal of Therapy:  Heparin level 0.3-0.7 units/ml Monitor platelets by anticoagulation protocol: Yes   Plan:  Start heparin infusion at 1300 units/hr on 5/20'@1830'$  Order heparin level in 6 hours after start Monitor daily HL, CBC, and for s/sx of bleeding  Antonietta Jewel, PharmD, [place] Pharmacist  Phone: [phone] 11/09/2020 4:31 PM  Please check AMION for all [place] phone numbers After 10:00 PM, call [place] [phone]

## 2020-11-09 NOTE — Progress Notes (Addendum)
NAME:  Tom Scott, MRN:  [id number], DOB:  1955-09-24, LOS: 1 ADMISSION DATE:  11/08/2020, CONSULTATION DATE: 11/08/2020  CHIEF COMPLAINT:  Septic shock   History of Present Illness:   59 yoM with a history of atrial fibrillation on xarelto (not taking since 3/22), hypertension, DM, and HLD who initially presented to [facility] in [place] for chest and epigastric pain. Transferred to [facility] due to septic shock requiring pressors and concerns for choledocholithiasis.   He reports 2 weeks of intermittent abdominal pain that worsened 3 days ago. Endorses nausea, denies emesis, fevers, diarrhea. Sign out prior to transfer expressed concern for choledocholithiasis. On arrival, documentaion included a CT abdomen with cholelithiasis no cholecystitis. He also had lactic acidosis, transaminitis and an elevated t bili. He was also with atrial fibrillation with RVR and hypotensive started on pressors. Of note, he stopped taking Xarelto in March 2022 secondary to finances and has been taking aspirin. He   Past Medical History:  Atrial fibrillation HTN DM HLD Hx of gastric bypass   Significant Hospital Events:  - 5/19 admitted to [facility] ICU - 5/20 repeat US and CT abdomen showed acute calculous cholecystitis. Plan for percutaneous drain  today  Consults:  GI General surgery  Procedures:  - 5/20 central venous catheter   Significant Diagnostic Tests:  US abdomen and CT abdomen show acute calculous cholecystitis   Micro Data:  BC from [facility] and repeated on admission  Antimicrobials:  Metronidazole 5/18> Cefepime 5/19> Levaquin 5/18   Interim History / Subjective:   Awake and alert, following commands. Continues to have abdominal pain. States he feels tired. Expresses discomfort with the NG tube in place. Denies any history of renal dysfunction.  Objective   Blood pressure 95/72, pulse (!) 125, temperature 98.4 F (36.9 C), temperature source Oral, resp.  rate (!) 34, height '5\' 10"'$  (1.778 m), weight 98 kg, SpO2 97 %. CVP:  [8 mmHg-12 mmHg] 8 mmHg      Intake/Output Summary (Last 24 hours) at 11/09/2020 [id number] Last data filed at 11/09/2020 0500 Gross per 24 hour  Intake 4482.72 ml  Output 440 ml  Net 4042.72 ml   Filed Weights   11/08/20 1600  Weight: 98 kg    Examination: General: elderly male, acutely ill appearing, appears uncomfortable HENT: antiicteric sclerae, dry membranes, on 8L supplemental oxygen Lungs: CTA bilaterally, normal work of breathing Cardiovascular: s1, s2, tachycardic, no murmurs rubs or gallops Abdomen: soft, bowel sounds present, tenderness to palpation in upper quadrants Extremities: no edema Neuro: alert and oriented x3, following commands Skin: warm and dry  Resolved Hospital Problem list   none  Assessment & Plan:   Septic shock Acute calculous cholecystitis  Lactic acidosis  Transaminitis - Levophed stopped, remains on phenylephrine - Repeat abdominal US and CT abdomen show acute calculous cholecystitis - KUB concerning for SBO, NG tube placed to suction - MAP goal >65 - Continue cefepime and metronidazole  - Will give IVF boluses to facilitate weaning from pressor support - GI and general surgery on board, appreciate recommendations - Plan for percutaneous cholecystostomy with IR today; with his worsening renal function may need to consider CO2 vs contrast - Trend LFTs   Afib w/ RVR - Likely secondary to sepsis  - Continue amiodarone - Not taking xarelto since 3/22 secondary to finances, taking aspirin  - Continue telemetry  - Start heparin s/p surgery  AKI - Likely in the setting of septic shock  - Cr 2.4, 1.99 yesterday -  Unclear baseline renal function although reports no hx of renal dysfunction - Trend BMP - Avoid nephrotoxic agents    HTN - Hold home antihypertensives  - Pressor support as above  DM - SSI  - CBG monitoring  HLD - Continue statin once home meds are  confirmed     Best practice (evaluated daily)  Diet: NPO Pain/Anxiety/Delirium protocol (if indicated): IV dilaudid VAP protocol (if indicated): n/a DVT prophylaxis: SCDs, plan for heparin once procedure plan determined GI prophylaxis: PPI Glucose control: SSI Mobility: bedrest Disposition: ICU  Goals of Care:  Last date of multidisciplinary goals of care discussion:5/20 Family and staff present: Yes Summary of discussion: continue current plan of care Follow up goals of care discussion due: n/a Code Status: Full code  Labs   CBC: Recent Labs  Lab 11/08/20 1426 11/09/20 0358  WBC 17.3* 22.5*  HGB 11.0* 10.1*  HCT 35.9* 32.8*  MCV 85.7 87.0  PLT 145* 100*    Basic Metabolic Panel: Recent Labs  Lab 11/08/20 1426 11/09/20 0358  NA 137 132*  K 4.5 4.8  CL 105 105  CO2 18* 15*  GLUCOSE 136* 147*  BUN 28* 35*  CREATININE 1.99* 2.38*  CALCIUM 8.4* 7.4*   GFR: Estimated Creatinine Clearance: 35.4 mL/min (A) (by C-G formula based on SCr of 2.38 mg/dL (H)). Recent Labs  Lab 11/08/20 1426 11/08/20 2035 11/09/20 0105 11/09/20 0358  WBC 17.3*  --   --  22.5*  LATICACIDVEN  --  4.8* 4.5*  --     Liver Function Tests: Recent Labs  Lab 11/08/20 1426 11/09/20 0358  AST 392* 200*  ALT 348* 263*  ALKPHOS 137* 103  BILITOT 3.2* 3.5*  PROT 6.5 5.6*  ALBUMIN 3.2* 2.6*   Recent Labs  Lab 11/08/20 1428 11/09/20 0358  LIPASE 79* 27   No results for input(s): AMMONIA in the last 168 hours.  ABG No results found for: PHART, PCO2ART, PO2ART, HCO3, TCO2, ACIDBASEDEF, O2SAT   Coagulation Profile: Recent Labs  Lab 11/08/20 1426  INR 1.4*    Cardiac Enzymes: No results for input(s): CKTOTAL, CKMB, CKMBINDEX, TROPONINI in the last 168 hours.  HbA1C: Hgb A1c MFr Bld  Date/Time Value Ref Range Status  11/08/2020 02:26 PM 8.3 (H) 4.8 - 5.6 % Final    Comment:    (NOTE) Pre diabetes:          5.7%-6.4%  Diabetes:              >6.4%  Glycemic control  for   <7.0% adults with diabetes     CBG: Recent Labs  Lab 11/08/20 1748 11/08/20 1944 11/08/20 2306 11/09/20 0056 11/09/20 0353  GLUCAP 133* 139* 156* 152* 121*     Past Medical History:  He,  has a past medical history of Atrial fibrillation ([place]), Diabetes mellitus ([place]), and Hypertension.   Surgical History:   Past Surgical History:  Procedure Laterality Date  . GASTRIC BYPASS       Social History:      Family History:  His family history is not on file.   Allergies Allergies  Allergen Reactions  . Penicillins      Home Medications  Prior to Admission medications   Not on [place], DO PGY-2 IMTS

## 2020-11-09 NOTE — Progress Notes (Signed)
eLink Physician-Brief Progress Note Patient Name: Tom Scott DOB: 1956/02/25 MRN: [id number]   Date of Service  11/09/2020  HPI/Events of Note  Lactic Acid level = 4.8 --> 4.5.   eICU Interventions  Plan: 1. Bolus with 0.9 NaCl 1 liter IV over 1 hour now. 2. Nurse to check CVP now.      Intervention Category Major Interventions: Acid-Base disturbance - evaluation and management  [name] 11/09/2020, 2:52 AM

## 2020-11-09 NOTE — Consult Note (Signed)
Chief Complaint: Patient was seen in consultation today for cholecystitis  Referring Physician(s): Dr. Brantley Stage  Supervising Physician: Daryll Brod  Patient Status: [facility] - In-pt  History of Present Illness: Tom Scott is a 65 y.o. male with past medical history of HTN, DM, HLD, a fib (not currently on anticoagulation) who presented from OSH due to concern for acute cholecystitis.  Patient with severe abdominal pain, nausea, and chills.    CT Abdomen Pelvis 11/09/20 showed: IMPRESSION: 1. Findings consistent with acute calculus cholecystitis. No evidence of perforation. No biliary ductal dilation. 2. Bibasilar airspace consolidations may represent atelectasis or pneumonia. 3. Postsurgical changes of gastric bypass. Prominent loops of contrast filled small bowel in the left upper quadrant without abrupt transition to decompressed bowel. Findings may represent Ileus.  IR consulted for percutaneous cholecystostomy tube placement.  Patient is s/p gastric bypass surgery several years ago, not likely ERCP candidate.  May be able to undergo surgical cholecystectomy once more medically stable.  Surgery has consulted and recommends proceeding with cholecystostomy tube.  Patient assessed in ICU.  He is stable on pressor support and O2.  He has an NGT in place which can be suctioned if needed.  Wife at bedside.  They are in agreement to proceed with drain placement today.   Past Medical History:  Diagnosis Date  . Atrial fibrillation ([place])   . Diabetes mellitus ([place])   . Hypertension     Past Surgical History:  Procedure Laterality Date  . GASTRIC BYPASS      Allergies: Penicillins  Medications: Prior to Admission medications   Not on File     History reviewed. No pertinent family history.  Social History   Socioeconomic History  . Marital status: Married    Spouse name: Not on file  . Number of children: Not on file  . Years of education: Not on file  . Highest  education level: Not on file  Occupational History  . Not on file  Tobacco Use  . Smoking status: Not on file  . Smokeless tobacco: Not on file  Substance and Sexual Activity  . Alcohol use: Not on file  . Drug use: Not on file  . Sexual activity: Not on file  Other Topics Concern  . Not on file  Social History Narrative  . Not on file   Social Determinants of Health   Financial Resource Strain: Not on file  Food Insecurity: Not on file  Transportation Needs: Not on file  Physical Activity: Not on file  Stress: Not on file  Social Connections: Not on file     Review of Systems: A 12 point ROS discussed and pertinent positives are indicated in the HPI above.  All other systems are negative.  Review of Systems  Constitutional: Negative for fatigue and fever.  Respiratory: Negative for cough and shortness of breath.   Cardiovascular: Negative for chest pain.  Gastrointestinal: Positive for abdominal pain, nausea and vomiting.  Musculoskeletal: Negative for back pain.  Psychiatric/Behavioral: Negative for behavioral problems and confusion.    Vital Signs: BP 101/60   Pulse (!) 109   Temp 98.3 F (36.8 C) (Oral)   Resp (!) 23   Ht 5' 10"  (1.778 m)   Wt 216 lb 0.8 oz (98 kg)   SpO2 99%   BMI 31.00 kg/m   Physical Exam Vitals and nursing note reviewed.  Constitutional:      General: He is not in acute distress.    Appearance: Normal appearance. He is  ill-appearing.  HENT:     Mouth/Throat:     Mouth: Mucous membranes are moist.     Pharynx: Oropharynx is clear.  Cardiovascular:     Rate and Rhythm: Normal rate and regular rhythm.  Pulmonary:     Effort: Pulmonary effort is normal. No respiratory distress.     Breath sounds: Normal breath sounds.     Comments: Nasal cannula Skin:    General: Skin is warm and dry.  Neurological:     General: No focal deficit present.     Mental Status: He is alert and oriented to person, place, and time.  Psychiatric:         Mood and Affect: Mood normal.        Behavior: Behavior normal.        Thought Content: Thought content normal.        Judgment: Judgment normal.      MD Evaluation Airway: WNL Heart: WNL Abdomen: WNL Chest/ Lungs: WNL ASA  Classification: 3 Mallampati/Airway Score: Two   Imaging: CT ABDOMEN PELVIS WO CONTRAST  Result Date: 11/09/2020 CLINICAL DATA:  Nausea and vomiting EXAM: CT ABDOMEN AND PELVIS WITHOUT CONTRAST TECHNIQUE: Multidetector CT imaging of the abdomen and pelvis was performed following the standard protocol without IV contrast. COMPARISON:  Abdominal ultrasound Nov 08, 2020. FINDINGS: Lower chest: Evaluation of the lung bases is limited by respiratory motion. Bibasilar airspace consolidations. Partially visualized central venous catheter with tip overlying the superior cavoatrial junction. Coronary artery calcifications. Hepatobiliary: Unremarkable noncontrast appearance of the hepatic parenchyma. No portal venous gas. Gallbladder is distended with layering cholelithiasis wall thickening and pericholecystic fluid consistent with acute calculus cholecystitis. No biliary ductal dilation. Pancreas: Fatty replacement of the pancreatic parenchyma. No pancreatic ductal dilation. Spleen: Within normal limits. Adrenals/Urinary Tract: Bilateral adrenal glands are unremarkable. No hydronephrosis. No contour deforming renal masses. Urinary bladder is decompressed around a Foley catheter. Stomach/Bowel: Postsurgical changes of gastric bypass. Nasogastric tube with tip in the Roux limb. Prominent loops of contrast filled small bowel in the left upper quadrant without abrupt transition to decompressed bowel. Appendix not visualized. No colonic wall or adjacent inflammation. Vascular/Lymphatic: IVC filter. Aortic atherosclerosis without aneurysmal dilation. No pathologically enlarged abdominal or pelvic lymph nodes. Reproductive: Prostate is unremarkable. Other: Pericholecystic predominant right  upper quadrant stranding with trace fluid in the right pericolic gutter without walled off collection. No pneumoperitoneum. Musculoskeletal: Multilevel degenerative changes spine with bony ankylosis of the thoracolumbar spine. No acute osseous abnormality. IMPRESSION: 1. Findings consistent with acute calculus cholecystitis. No evidence of perforation. No biliary ductal dilation. 2. Bibasilar airspace consolidations may represent atelectasis or pneumonia. 3. Postsurgical changes of gastric bypass. Prominent loops of contrast filled small bowel in the left upper quadrant without abrupt transition to decompressed bowel. Findings may represent ileus. 4. Aortic atherosclerosis. Aortic Atherosclerosis (ICD10-I70.0). Electronically Signed   By: Dahlia Bailiff MD   On: 11/09/2020 00:36   DG CHEST PORT 1 VIEW  Result Date: 11/08/2020 CLINICAL DATA:  Central line placement EXAM: PORTABLE CHEST 1 VIEW COMPARISON:  None. FINDINGS: Right IJ approach central venous catheter tip terminates near the superior cavoatrial junction. Notable cardiomegaly though may be somewhat accentuated by portable technique and low volumes. Calcified aorta. Low lung volumes and atelectatic changes including more bandlike areas of subsegmental atelectasis towards the bases. Additional hazy and patchy opacities could reflect further atelectatic volume loss though early airspace disease is difficult to exclude. Suspect at least small bilateral effusions. No pneumothorax. No acute osseous or soft  tissue abnormality. Telemetry leads overlie the chest. IMPRESSION: Right IJ approach central venous catheter tip terminates at the superior cavoatrial junction. Low volumes and atelectasis including more bandlike subsegmental atelectasis in the bases. Patchy opacities could reflect further volume loss or early airspace disease. Suspect small bilateral effusions. Enlarged cardiac silhouette though may be accentuated by portable technique. Electronically  Signed   By: Lovena Le M.D.   On: 11/08/2020 16:36   DG Abd Portable 1V  Result Date: 11/08/2020 CLINICAL DATA:  Check gastric catheter placement EXAM: PORTABLE ABDOMEN - 1 VIEW COMPARISON:  Film from earlier in the same day. FINDINGS: Gastric catheter is noted within the stomach. Proximal side port however is noted in the distal esophagus. This should be advanced several cm deeper into the stomach. Multiple dilated loops of small bowel are again identified consistent with at least partial small bowel obstruction. IMPRESSION: Persistent small bowel dilatation. Gastric catheter as described. This should be advanced deeper into the stomach. Electronically Signed   By: Inez Catalina M.D.   On: 11/08/2020 21:53   DG Abd Portable 1V  Result Date: 11/08/2020 CLINICAL DATA:  Abdominal pain and distention. EXAM: PORTABLE ABDOMEN - 1 VIEW COMPARISON:  No prior. FINDINGS: Surgical clips right upper quadrant and pelvis. Multiple dilated loops of small bowel noted. Relative paucity of colonic gas. Findings suggest small bowel obstruction. Follow-up abdominal series suggested for further evaluation. It is difficult to evaluate for free air as hemidiaphragms incompletely imaged. IVC filter noted. Degenerative change lumbar spine. IMPRESSION: Multiple dilated loops of small bowel noted. Relative paucity of colonic gas. Findings suggest small bowel obstruction. Follow-up abdominal series suggested for further evaluation. Electronically Signed   By: Marcello Moores  Register   On: 11/08/2020 16:35   US Abdomen Limited RUQ (LIVER/GB)  Result Date: 11/08/2020 CLINICAL DATA:  Right upper quadrant abdominal pain EXAM: ULTRASOUND ABDOMEN LIMITED RIGHT UPPER QUADRANT COMPARISON:  None. FINDINGS: Gallbladder: The gallbladder is mildly distended and contains numerous layering gravel-like gallstones. There is mild pericholecystic fluid identified along the hepatics surface as well as mild gallbladder wall thickening noted. The  sonographic Percell Miller sign is reportedly positive. Altogether, the findings are in keeping with changes of acute calculus cholecystitis. Common bile duct: Diameter: 4 mm Liver: No focal lesion identified. Within normal limits in parenchymal echogenicity. Portal vein is patent on color Doppler imaging with normal direction of blood flow towards the liver. Other: No ascites IMPRESSION: Acute calculus cholecystitis. No intra or extrahepatic biliary ductal dilation. Electronically Signed   By: Fidela Salisbury MD   On: 11/08/2020 22:20    Labs:  CBC: Recent Labs    11/08/20 1426 11/09/20 0358  WBC 17.3* 22.5*  HGB 11.0* 10.1*  HCT 35.9* 32.8*  PLT 145* 100*    COAGS: Recent Labs    11/08/20 1426  INR 1.4*    BMP: Recent Labs    11/08/20 1426 11/09/20 0358  NA 137 132*  K 4.5 4.8  CL 105 105  CO2 18* 15*  GLUCOSE 136* 147*  BUN 28* 35*  CALCIUM 8.4* 7.4*  CREATININE 1.99* 2.38*  GFRNONAA 36* 29*    LIVER FUNCTION TESTS: Recent Labs    11/08/20 1426 11/09/20 0358  BILITOT 3.2* 3.5*  AST 392* 200*  ALT 348* 263*  ALKPHOS 137* 103  PROT 6.5 5.6*  ALBUMIN 3.2* 2.6*    TUMOR MARKERS: No results for input(s): AFPTM, CEA, CA199, CHROMGRNA in the last 8760 hours.  Assessment and Plan: Acute cholecystitis Patient transferred from OSH  with concern for acute cholecystitis and septic shock.  He is on pressure support, IV antibiotics and amiodarone infusion for a fib.  CT reviewed by Dr. Annamaria Boots who approves for procedure today.  PA met with patient and wife at bedside. They are agreeable to procedure today.  He has been NPO with NGT in place.  Not on anticoagulation.   Risks and benefits discussed with the patient including, but not limited to bleeding, infection, gallbladder perforation, bile leak, sepsis or even death.  All of the patient's questions were answered, patient is agreeable to proceed. Consent signed and in chart.    Thank you for this interesting consult.   I greatly enjoyed meeting Faris Coolman and look forward to participating in their care.  A copy of this report was sent to the requesting provider on this date.  Electronically Signed: Docia Barrier, PA 11/09/2020, 11:05 AM   I spent a total of 40 Minutes    in face to face in clinical consultation, greater than 50% of which was counseling/coordinating care for acute cholecystitis.

## 2020-11-09 NOTE — Progress Notes (Signed)
Subjective: CC: Epigastric abdominal pain Patient continues to have moderate to severe epigastric abdominal pain that radiates to his RUQ > LUQ. Reports associated nausea. NGT in place. Denies flatus. Foley placed yesterday. Having some sob. Levo stopped overnight. Still on phenylephrine.   Objective: Vital signs in last 24 hours: Temp:  [98.2 F (36.8 C)-99 F (37.2 C)] 98.4 F (36.9 C) (05/20 0400) Pulse Rate:  [106-150] 125 (05/20 0530) Resp:  [23-35] 34 (05/20 0530) BP: (72-124)/(50-80) 95/72 (05/20 0530) SpO2:  [89 %-100 %] 97 % (05/20 0530) Weight:  [98 kg] 98 kg (05/19 1600) Last BM Date: 11/07/20  Intake/Output from previous day: 05/19 0701 - 05/20 0700 In: 4482.7 [I.V.:2893.9; IV Piggyback:1588.8] Out: 440 [Urine:440] Intake/Output this shift: No intake/output data recorded.  PE: Gen:  Alert, NAD, pleasant HEENT: EOM's intact, pupils equal and round Card:  Tachycardic with irregular rhythm  Pulm:  Distant breath sounds at the bases b/l. Clear to upper lung fields. On o2. Tachypnea noted. No accessory muscle use.  Abd: Mild distended but soft with tenderness of the upper abdomen diffusely greatest in the epigastrium with guarding. Hypoactive bowel sounds.  Psych: A&Ox3  Skin: no rashes noted, warm and dry   Lab Results:  Recent Labs    11/08/20 1426 11/09/20 0358  WBC 17.3* 22.5*  HGB 11.0* 10.1*  HCT 35.9* 32.8*  PLT 145* 100*   BMET Recent Labs    11/08/20 1426 11/09/20 0358  NA 137 132*  K 4.5 4.8  CL 105 105  CO2 18* 15*  GLUCOSE 136* 147*  BUN 28* 35*  CREATININE 1.99* 2.38*  CALCIUM 8.4* 7.4*   PT/INR Recent Labs    11/08/20 1426  LABPROT 17.3*  INR 1.4*   CMP     Component Value Date/Time   NA 132 (L) 11/09/2020 0358   K 4.8 11/09/2020 0358   CL 105 11/09/2020 0358   CO2 15 (L) 11/09/2020 0358   GLUCOSE 147 (H) 11/09/2020 0358   BUN 35 (H) 11/09/2020 0358   CREATININE 2.38 (H) 11/09/2020 0358   CALCIUM 7.4 (L)  11/09/2020 0358   PROT 5.6 (L) 11/09/2020 0358   ALBUMIN 2.6 (L) 11/09/2020 0358   AST 200 (H) 11/09/2020 0358   ALT 263 (H) 11/09/2020 0358   ALKPHOS 103 11/09/2020 0358   BILITOT 3.5 (H) 11/09/2020 0358   GFRNONAA 29 (L) 11/09/2020 0358   Lipase     Component Value Date/Time   LIPASE 27 11/09/2020 0358       Studies/Results: CT ABDOMEN PELVIS WO CONTRAST  Result Date: 11/09/2020 CLINICAL DATA:  Nausea and vomiting EXAM: CT ABDOMEN AND PELVIS WITHOUT CONTRAST TECHNIQUE: Multidetector CT imaging of the abdomen and pelvis was performed following the standard protocol without IV contrast. COMPARISON:  Abdominal ultrasound Nov 08, 2020. FINDINGS: Lower chest: Evaluation of the lung bases is limited by respiratory motion. Bibasilar airspace consolidations. Partially visualized central venous catheter with tip overlying the superior cavoatrial junction. Coronary artery calcifications. Hepatobiliary: Unremarkable noncontrast appearance of the hepatic parenchyma. No portal venous gas. Gallbladder is distended with layering cholelithiasis wall thickening and pericholecystic fluid consistent with acute calculus cholecystitis. No biliary ductal dilation. Pancreas: Fatty replacement of the pancreatic parenchyma. No pancreatic ductal dilation. Spleen: Within normal limits. Adrenals/Urinary Tract: Bilateral adrenal glands are unremarkable. No hydronephrosis. No contour deforming renal masses. Urinary bladder is decompressed around a Foley catheter. Stomach/Bowel: Postsurgical changes of gastric bypass. Nasogastric tube with tip in the Roux limb. Prominent loops  of contrast filled small bowel in the left upper quadrant without abrupt transition to decompressed bowel. Appendix not visualized. No colonic wall or adjacent inflammation. Vascular/Lymphatic: IVC filter. Aortic atherosclerosis without aneurysmal dilation. No pathologically enlarged abdominal or pelvic lymph nodes. Reproductive: Prostate is  unremarkable. Other: Pericholecystic predominant right upper quadrant stranding with trace fluid in the right pericolic gutter without walled off collection. No pneumoperitoneum. Musculoskeletal: Multilevel degenerative changes spine with bony ankylosis of the thoracolumbar spine. No acute osseous abnormality. IMPRESSION: 1. Findings consistent with acute calculus cholecystitis. No evidence of perforation. No biliary ductal dilation. 2. Bibasilar airspace consolidations may represent atelectasis or pneumonia. 3. Postsurgical changes of gastric bypass. Prominent loops of contrast filled small bowel in the left upper quadrant without abrupt transition to decompressed bowel. Findings may represent ileus. 4. Aortic atherosclerosis. Aortic Atherosclerosis (ICD10-I70.0). Electronically Signed   By: Dahlia Bailiff MD   On: 11/09/2020 00:36   DG CHEST PORT 1 VIEW  Result Date: 11/08/2020 CLINICAL DATA:  Central line placement EXAM: PORTABLE CHEST 1 VIEW COMPARISON:  None. FINDINGS: Right IJ approach central venous catheter tip terminates near the superior cavoatrial junction. Notable cardiomegaly though may be somewhat accentuated by portable technique and low volumes. Calcified aorta. Low lung volumes and atelectatic changes including more bandlike areas of subsegmental atelectasis towards the bases. Additional hazy and patchy opacities could reflect further atelectatic volume loss though early airspace disease is difficult to exclude. Suspect at least small bilateral effusions. No pneumothorax. No acute osseous or soft tissue abnormality. Telemetry leads overlie the chest. IMPRESSION: Right IJ approach central venous catheter tip terminates at the superior cavoatrial junction. Low volumes and atelectasis including more bandlike subsegmental atelectasis in the bases. Patchy opacities could reflect further volume loss or early airspace disease. Suspect small bilateral effusions. Enlarged cardiac silhouette though may be  accentuated by portable technique. Electronically Signed   By: Lovena Le M.D.   On: 11/08/2020 16:36   DG Abd Portable 1V  Result Date: 11/08/2020 CLINICAL DATA:  Check gastric catheter placement EXAM: PORTABLE ABDOMEN - 1 VIEW COMPARISON:  Film from earlier in the same day. FINDINGS: Gastric catheter is noted within the stomach. Proximal side port however is noted in the distal esophagus. This should be advanced several cm deeper into the stomach. Multiple dilated loops of small bowel are again identified consistent with at least partial small bowel obstruction. IMPRESSION: Persistent small bowel dilatation. Gastric catheter as described. This should be advanced deeper into the stomach. Electronically Signed   By: Inez Catalina M.D.   On: 11/08/2020 21:53   DG Abd Portable 1V  Result Date: 11/08/2020 CLINICAL DATA:  Abdominal pain and distention. EXAM: PORTABLE ABDOMEN - 1 VIEW COMPARISON:  No prior. FINDINGS: Surgical clips right upper quadrant and pelvis. Multiple dilated loops of small bowel noted. Relative paucity of colonic gas. Findings suggest small bowel obstruction. Follow-up abdominal series suggested for further evaluation. It is difficult to evaluate for free air as hemidiaphragms incompletely imaged. IVC filter noted. Degenerative change lumbar spine. IMPRESSION: Multiple dilated loops of small bowel noted. Relative paucity of colonic gas. Findings suggest small bowel obstruction. Follow-up abdominal series suggested for further evaluation. Electronically Signed   By: Marcello Moores  Register   On: 11/08/2020 16:35   US Abdomen Limited RUQ (LIVER/GB)  Result Date: 11/08/2020 CLINICAL DATA:  Right upper quadrant abdominal pain EXAM: ULTRASOUND ABDOMEN LIMITED RIGHT UPPER QUADRANT COMPARISON:  None. FINDINGS: Gallbladder: The gallbladder is mildly distended and contains numerous layering gravel-like gallstones. There is mild  pericholecystic fluid identified along the hepatics surface as well as  mild gallbladder wall thickening noted. The sonographic Percell Miller sign is reportedly positive. Altogether, the findings are in keeping with changes of acute calculus cholecystitis. Common bile duct: Diameter: 4 mm Liver: No focal lesion identified. Within normal limits in parenchymal echogenicity. Portal vein is patent on color Doppler imaging with normal direction of blood flow towards the liver. Other: No ascites IMPRESSION: Acute calculus cholecystitis. No intra or extrahepatic biliary ductal dilation. Electronically Signed   By: Fidela Salisbury MD   On: 11/08/2020 22:20    Anti-infectives: Anti-infectives (From admission, onward)   Start     Dose/Rate Route Frequency Ordered Stop   11/08/20 1600  ceFEPIme (MAXIPIME) 2 g in sodium chloride 0.9 % 100 mL IVPB        2 g 200 mL/hr over 30 Minutes Intravenous Every 12 hours 11/08/20 1456     11/08/20 1500  metroNIDAZOLE (FLAGYL) IVPB 500 mg        500 mg 100 mL/hr over 60 Minutes Intravenous Every 8 hours 11/08/20 1404         Assessment/Plan HTN DM HLD Atrial fibrillation - off xarelto since 08/2020, takes ASA 162.'5mg'$  qd. Currently on amio gtt Hx gastric bypass ~15 years ago AKI - Cr 2.38. Discussed with family that there is risk of worsening kidney function w/ contrast studies/procedure. I have discussed with CCM and they are going to evaluate this to determine if nephrology consult is needed per pop-up for GFR < 30 when ordering perc chole.   Sepsis 2/2 Acute Calculus Cholecystitis  Elevated LFTs - CT A/P and RUQ w/ evidence of cholecystitis. Patient remains on pressors. Will ask IR team to evaluation for Percutaneous Cholecystostomy drainage. Spoke with Dr. Annamaria Boots of IR.  - LFT's elevated w/ AST 200, ALT 263, and T Bili 3.5. Suspect 2/2 Cholecystitis but continue to trend. No evidence of CBD dilation or choledocholithiasis on CT or [place]. If continues to rise after Perc Chole, could consider MRCP.  - Continue IV abx  - Discussed with family  that perc chole will likely remain in place for at least 6 weeks before possible elective cholecystectomy as outpatient - Appears to have ileus on xray and CT. Currently with NGT. Continue until ROBF - We will follow with you  ID - cefepime/flagyl 5/19>> VTE - SCDs, per primary team FEN - IVF, NPO, NGT Foley - in place, per primary  Follow up - TBD   LOS: 1 day    Jillyn Ledger , [facility] 11/09/2020, 7:35 AM Please see Amion for pager number during day hours 7:00am-4:30pm

## 2020-11-09 NOTE — Procedures (Signed)
Interventional Radiology Procedure Note  Procedure: [place] and fluoro perc cholecystostomy    Complications: None  Estimated Blood Loss:  0  Findings: Purulent bile asp cx sent    Tamera Punt, MD

## 2020-11-10 DIAGNOSIS — R6521 Severe sepsis with septic shock: Secondary | ICD-10-CM | POA: Diagnosis not present

## 2020-11-10 DIAGNOSIS — A419 Sepsis, unspecified organism: Secondary | ICD-10-CM | POA: Diagnosis not present

## 2020-11-10 LAB — COMPREHENSIVE METABOLIC PANEL
ALT: 199 U/L — ABNORMAL HIGH (ref 0–44)
AST: 125 U/L — ABNORMAL HIGH (ref 15–41)
Albumin: 2.6 g/dL — ABNORMAL LOW (ref 3.5–5.0)
Alkaline Phosphatase: 125 U/L (ref 38–126)
Anion gap: 13 (ref 5–15)
BUN: 48 mg/dL — ABNORMAL HIGH (ref 8–23)
CO2: 16 mmol/L — ABNORMAL LOW (ref 22–32)
Calcium: 7.5 mg/dL — ABNORMAL LOW (ref 8.9–10.3)
Chloride: 105 mmol/L (ref 98–111)
Creatinine, Ser: 3.38 mg/dL — ABNORMAL HIGH (ref 0.61–1.24)
GFR, Estimated: 19 mL/min — ABNORMAL LOW (ref >60)
Glucose, Bld: 109 mg/dL — ABNORMAL HIGH (ref 70–99)
Potassium: 4.4 mmol/L (ref 3.5–5.1)
Sodium: 134 mmol/L — ABNORMAL LOW (ref 135–145)
Total Bilirubin: 3.5 mg/dL — ABNORMAL HIGH (ref 0.3–1.2)
Total Protein: 5.7 g/dL — ABNORMAL LOW (ref 6.5–8.1)

## 2020-11-10 LAB — GLUCOSE, CAPILLARY
Glucose-Capillary: 121 mg/dL — ABNORMAL HIGH (ref 70–99)
Glucose-Capillary: 88 mg/dL (ref 70–99)
Glucose-Capillary: 109 mg/dL — ABNORMAL HIGH (ref 70–99)
Glucose-Capillary: 109 mg/dL — ABNORMAL HIGH (ref 70–99)
Glucose-Capillary: 104 mg/dL — ABNORMAL HIGH (ref 70–99)
Glucose-Capillary: 113 mg/dL — ABNORMAL HIGH (ref 70–99)

## 2020-11-10 LAB — CBC
HCT: 31.7 % — ABNORMAL LOW (ref 39.0–52.0)
Hemoglobin: 10.2 g/dL — ABNORMAL LOW (ref 13.0–17.0)
MCH: 27.3 pg (ref 26.0–34.0)
MCHC: 32.2 g/dL (ref 30.0–36.0)
MCV: 85 fL (ref 80.0–100.0)
Platelets: 92 10*3/uL — ABNORMAL LOW (ref 150–400)
RBC: 3.73 MIL/uL — ABNORMAL LOW (ref 4.22–5.81)
RDW: 17.2 % — ABNORMAL HIGH (ref 11.5–15.5)
WBC: 21 10*3/uL — ABNORMAL HIGH (ref 4.0–10.5)
nRBC: 0 % (ref 0.0–0.2)

## 2020-11-10 LAB — HEPARIN LEVEL (UNFRACTIONATED)
Heparin Unfractionated: 0.14 IU/mL — ABNORMAL LOW (ref 0.30–0.70)
Heparin Unfractionated: 0.11 IU/mL — ABNORMAL LOW (ref 0.30–0.70)
Heparin Unfractionated: 0.2 IU/mL — ABNORMAL LOW (ref 0.30–0.70)

## 2020-11-10 LAB — LACTIC ACID, PLASMA: Lactic Acid, Venous: 0.8 mmol/L (ref 0.5–1.9)

## 2020-11-10 MED ORDER — HEPARIN BOLUS VIA INFUSION
2000.0000 [IU] | Freq: Once | INTRAVENOUS | Status: AC
  Administered 2020-11-10: 2000 [IU] via INTRAVENOUS
  Filled 2020-11-10: qty 2000

## 2020-11-10 MED ORDER — LACTATED RINGERS IV BOLUS
500.0000 mL | Freq: Once | INTRAVENOUS | Status: AC
  Administered 2020-11-10: 500 mL via INTRAVENOUS

## 2020-11-10 MED ORDER — SODIUM CHLORIDE 0.9 % IV SOLN
1.0000 g | Freq: Two times a day (BID) | INTRAVENOUS | Status: DC
  Administered 2020-11-10 – 2020-11-11 (×3): 1 g via INTRAVENOUS
  Filled 2020-11-10 (×5): qty 1

## 2020-11-10 NOTE — Progress Notes (Signed)
ANTICOAGULATION CONSULT NOTE - Initial Consult  Pharmacy Consult for heparin Indication: atrial fibrillation  Allergies  Allergen Reactions  . Penicillamine     Other reaction(s): lips swell  . Penicillins     Other reaction(s): Swelling  Legacy System: CCA Onset Date: <blank> Substance Legacy/Cerner: penicillins / penicillins (Legacy value) Category: Drug Severity Legacy/Cerner: <blank> / Unknown Reaction(s): swelling Comments: <blank>  Legacy System: CCA Onset Date: <blank> Substance Legacy/Cerner: ampicillin / ampicillin (Legacy value) Category: Drug Severity Legacy/Cerner: <blank> / Unknown Reaction(s): swelling Comments: <blank>  Legacy System: CCA Onset Date: <blank> Substance Legacy/Cerner: ampicillin / ampicillin (Legacy value) Category: Drug Severity Legacy/Cerner: <blank> / Unknown Reaction(s): swelling Comments: <blank>  Legacy System: CCA Onset Date: <blank> Substance Legacy/Cerner: penicillins / penicillins (Legacy value) Category: Drug Severity Legacy/Cerner: <blank> / Unknown Reaction(s): swelling Comments: <blank>     Patient Measurements: Height: '5\' 10"'$  (177.8 cm) Weight: 98 kg (216 lb 0.8 oz) IBW/kg (Calculated) : 73 Heparin Dosing Weight: 93.3 kg  Vital Signs: Temp: 98.1 F (36.7 C) (05/21 1130) Temp Source: Oral (05/21 1130) BP: 111/74 (05/21 1130) Pulse Rate: 112 (05/21 1130)  Labs: Recent Labs    11/08/20 1426 11/09/20 0358 11/10/20 0213 11/10/20 1054  HGB 11.0* 10.1* 10.2*  --   HCT 35.9* 32.8* 31.7*  --   PLT 145* 100* 92*  --   LABPROT 17.3*  --   --   --   INR 1.4*  --   --   --   HEPARINUNFRC  --   --  0.14* 0.11*  CREATININE 1.99* 2.38* 3.38*  --     Estimated Creatinine Clearance: 24.9 mL/min (A) (by C-G formula based on SCr of 3.38 mg/dL (H)).  Assessment: 39 yof presenting with cholelithiasis - now found to be in Afib with RVR.  Underwent perc cholecystomy today - was on Xarelto PTA (but off since March d/t  cost).   Hep lvl low this am  Cbc stable  Goal of Therapy:  Heparin level 0.3-0.7 units/ml Monitor platelets by anticoagulation protocol: Yes   Plan:  Hep bolus 2000 units x 1 Heparin gtt 1650 units/hr Recheck 2100 hl  Daily hep lvl cbc  Barth Kirks, PharmD, BCCCP Clinical Pharmacist [phone]  Please check AMION for all [place] numbers  11/10/2020 12:41 PM

## 2020-11-10 NOTE — Progress Notes (Signed)
eLink Physician-Brief Progress Note Patient Name: Tom Scott DOB: [birth year] MRN: [id number]   Date of Service  11/10/2020  HPI/Events of Note  Dr Marya Amsler from OSH called to report that Culture from GB - IR percutaneous cholecysytomy shwoing ESBL-e coli. All to penicilline.  Camera: Resting. VS stable. MAP good. Not on pressors. On heparin and ami gtt for a fib, rate controled.  Dats: Reviewed   eICU Interventions  ESBL e coli sepsis. Cholecystitis. Ostomy-IR - ordered Pharmacy consult for meropenum. Cr > 3.  Discussed with bed side RN also. Follow cultures from here and de escalate as tolerated.      Intervention Category Intermediate Interventions: Other:;Communication with other healthcare providers and/or family  Elmer Sow 11/10/2020, 9:37 PM

## 2020-11-10 NOTE — Progress Notes (Signed)
ANTICOAGULATION CONSULT NOTE - follow up  Pharmacy Consult for heparin Indication: atrial fibrillation  Allergies  Allergen Reactions  . Penicillamine     Other reaction(s): lips swell  . Penicillins     Other reaction(s): Swelling  Legacy System: CCA Onset Date: <blank> Substance Legacy/Cerner: penicillins / penicillins (Legacy value) Category: Drug Severity Legacy/Cerner: <blank> / Unknown Reaction(s): swelling Comments: <blank>  Legacy System: CCA Onset Date: <blank> Substance Legacy/Cerner: ampicillin / ampicillin (Legacy value) Category: Drug Severity Legacy/Cerner: <blank> / Unknown Reaction(s): swelling Comments: <blank>  Legacy System: CCA Onset Date: <blank> Substance Legacy/Cerner: ampicillin / ampicillin (Legacy value) Category: Drug Severity Legacy/Cerner: <blank> / Unknown Reaction(s): swelling Comments: <blank>  Legacy System: CCA Onset Date: <blank> Substance Legacy/Cerner: penicillins / penicillins (Legacy value) Category: Drug Severity Legacy/Cerner: <blank> / Unknown Reaction(s): swelling Comments: <blank>     Patient Measurements: Height: '5\' 10"'$  (177.8 cm) Weight: 98 kg (216 lb 0.8 oz) IBW/kg (Calculated) : 73 Heparin Dosing Weight: 93.3 kg  Vital Signs: Temp: 97.7 F (36.5 C) (05/21 1951) Temp Source: Oral (05/21 1951) BP: 106/67 (05/21 2000) Pulse Rate: 110 (05/21 2000)  Labs: Recent Labs    11/08/20 1426 11/09/20 0358 11/10/20 0213 11/10/20 1054 11/10/20 2140  HGB 11.0* 10.1* 10.2*  --   --   HCT 35.9* 32.8* 31.7*  --   --   PLT 145* 100* 92*  --   --   LABPROT 17.3*  --   --   --   --   INR 1.4*  --   --   --   --   HEPARINUNFRC  --   --  0.14* 0.11* 0.20*  CREATININE 1.99* 2.38* 3.38*  --   --     Estimated Creatinine Clearance: 24.9 mL/min (A) (by C-G formula based on SCr of 3.38 mg/dL (H)).   Medical History: Past Medical History:  Diagnosis Date  . Atrial fibrillation ([place])   . Diabetes mellitus ([place])   .  Hypertension     Medications:  Scheduled:  . chlorhexidine  15 mL Mouth Rinse BID  . Chlorhexidine Gluconate Cloth  6 each Topical Daily  . insulin aspart  0-15 Units Subcutaneous Q4H  . mouth rinse  15 mL Mouth Rinse q12n4p  . pantoprazole (PROTONIX) IV  40 mg Intravenous Q24H  . sodium chloride flush  10-40 mL Intracatheter Q12H  . sodium chloride flush  5 mL Intracatheter Q8H    Assessment: 27 yof presenting with cholelithiasis - now found to be in Afib with RVR.  Underwent perc cholecystomy 5/20 - was on Xarelto PTA (but off since March d/t cost).  -heparin level up to 0.2 on 1650 units/hr    Goal of Therapy:  Heparin level 0.3-0.7 units/ml Monitor platelets by anticoagulation protocol: Yes   Plan:  -Increase heparin to 1900 units/hr -Heparin level in 8 hours and daily wth CBC daily  Hildred Laser, PharmD Clinical Pharmacist **Pharmacist phone directory can now be found on [email or web address] ([email or web address]).  Listed under [place].

## 2020-11-10 NOTE — Progress Notes (Signed)
Pharmacy Antibiotic Note  Tom Scott is a 65 y.o. male transferred to [facility] on 11/08/2020 with cholelithiasis.  Pharmacy has been consulted for meropenem (gall bladder cultures from OSH show ESBL) -WNC= 21, afebrile, CrCL ~ 25  Plan: -Meropenem 1gm IV q12h -Will follow renal function, cultures and clinical progress   Height: '5\' 10"'$  (177.8 cm) Weight: 98 kg (216 lb 0.8 oz) IBW/kg (Calculated) : 73  Temp (24hrs), Avg:98.3 F (36.8 C), Min:97.7 F (36.5 C), Max:98.6 F (37 C)  Recent Labs  Lab 11/08/20 1426 11/08/20 2035 11/09/20 0105 11/09/20 0358 11/10/20 0213  WBC 17.3*  --   --  22.5* 21.0*  CREATININE 1.99*  --   --  2.38* 3.38*  LATICACIDVEN  --  4.8* 4.5*  --   --     Estimated Creatinine Clearance: 24.9 mL/min (A) (by C-G formula based on SCr of 3.38 mg/dL (H)).    Allergies  Allergen Reactions  . Penicillamine     Other reaction(s): lips swell  . Penicillins     Other reaction(s): Swelling  Legacy System: CCA Onset Date: <blank> Substance Legacy/Cerner: penicillins / penicillins (Legacy value) Category: Drug Severity Legacy/Cerner: <blank> / Unknown Reaction(s): swelling Comments: <blank>  Legacy System: CCA Onset Date: <blank> Substance Legacy/Cerner: ampicillin / ampicillin (Legacy value) Category: Drug Severity Legacy/Cerner: <blank> / Unknown Reaction(s): swelling Comments: <blank>  Legacy System: CCA Onset Date: <blank> Substance Legacy/Cerner: ampicillin / ampicillin (Legacy value) Category: Drug Severity Legacy/Cerner: <blank> / Unknown Reaction(s): swelling Comments: <blank>  Legacy System: CCA Onset Date: <blank> Substance Legacy/Cerner: penicillins / penicillins (Legacy value) Category: Drug Severity Legacy/Cerner: <blank> / Unknown Reaction(s): swelling Comments: <blank>     LVQ PTA  Flagyl 5/19 >> 5/21 Cefepime 5/19 >>5/21 Meropenem 5/21>>  Hildred Laser, PharmD Clinical Pharmacist **Pharmacist phone directory can now  be found on [email or web address] ([email or web address]).  Listed under [place].

## 2020-11-10 NOTE — Progress Notes (Signed)
Subjective: Perc chole placed yesterday. WBC 21 today (22 yesterday). Remains on neo. Passing flatus. Abdominal pain slightly improved.   Objective: Vital signs in last 24 hours: Temp:  [98.3 F (36.8 C)-99.5 F (37.5 C)] 98.5 F (36.9 C) (05/21 0800) Pulse Rate:  [100-126] 113 (05/21 0930) Resp:  [17-32] 28 (05/21 0930) BP: (80-126)/(57-88) 93/69 (05/21 0930) SpO2:  [86 %-100 %] 91 % (05/21 0930) Last BM Date: 11/07/20  Intake/Output from previous day: 05/20 0701 - 05/21 0700 In: 4458 [I.V.:4132.8; IV Piggyback:320.3] Out: 605 [Urine:405; Drains:200] Intake/Output this shift: Total I/O In: 423.5 [I.V.:323.5; IV Piggyback:100] Out: -   PE: General: resting comfortably, NAD Neuro: alert and oriented, no focal deficits HEENT: NG in place with minimal nonbilious drainage Resp: normal work of breathing CV: irregular rhythm Abdomen: soft, nondistended, mildly tender in upper abdomen. RUQ perc chole with seropurulent fluid.   Lab Results:  Recent Labs    11/09/20 0358 11/10/20 0213  WBC 22.5* 21.0*  HGB 10.1* 10.2*  HCT 32.8* 31.7*  PLT 100* 92*   BMET Recent Labs    11/09/20 0358 11/10/20 0213  NA 132* 134*  K 4.8 4.4  CL 105 105  CO2 15* 16*  GLUCOSE 147* 109*  BUN 35* 48*  CREATININE 2.38* 3.38*  CALCIUM 7.4* 7.5*   PT/INR Recent Labs    11/08/20 1426  LABPROT 17.3*  INR 1.4*   CMP     Component Value Date/Time   NA 134 (L) 11/10/2020 0213   K 4.4 11/10/2020 0213   CL 105 11/10/2020 0213   CO2 16 (L) 11/10/2020 0213   GLUCOSE 109 (H) 11/10/2020 0213   BUN 48 (H) 11/10/2020 0213   CREATININE 3.38 (H) 11/10/2020 0213   CALCIUM 7.5 (L) 11/10/2020 0213   PROT 5.7 (L) 11/10/2020 0213   ALBUMIN 2.6 (L) 11/10/2020 0213   AST 125 (H) 11/10/2020 0213   ALT 199 (H) 11/10/2020 0213   ALKPHOS 125 11/10/2020 0213   BILITOT 3.5 (H) 11/10/2020 0213   GFRNONAA 19 (L) 11/10/2020 0213   Lipase     Component Value Date/Time   LIPASE 27  11/09/2020 0358       Studies/Results: CT ABDOMEN PELVIS WO CONTRAST  Result Date: 11/09/2020 CLINICAL DATA:  Nausea and vomiting EXAM: CT ABDOMEN AND PELVIS WITHOUT CONTRAST TECHNIQUE: Multidetector CT imaging of the abdomen and pelvis was performed following the standard protocol without IV contrast. COMPARISON:  Abdominal ultrasound Nov 08, 2020. FINDINGS: Lower chest: Evaluation of the lung bases is limited by respiratory motion. Bibasilar airspace consolidations. Partially visualized central venous catheter with tip overlying the superior cavoatrial junction. Coronary artery calcifications. Hepatobiliary: Unremarkable noncontrast appearance of the hepatic parenchyma. No portal venous gas. Gallbladder is distended with layering cholelithiasis wall thickening and pericholecystic fluid consistent with acute calculus cholecystitis. No biliary ductal dilation. Pancreas: Fatty replacement of the pancreatic parenchyma. No pancreatic ductal dilation. Spleen: Within normal limits. Adrenals/Urinary Tract: Bilateral adrenal glands are unremarkable. No hydronephrosis. No contour deforming renal masses. Urinary bladder is decompressed around a Foley catheter. Stomach/Bowel: Postsurgical changes of gastric bypass. Nasogastric tube with tip in the Roux limb. Prominent loops of contrast filled small bowel in the left upper quadrant without abrupt transition to decompressed bowel. Appendix not visualized. No colonic wall or adjacent inflammation. Vascular/Lymphatic: IVC filter. Aortic atherosclerosis without aneurysmal dilation. No pathologically enlarged abdominal or pelvic lymph nodes. Reproductive: Prostate is unremarkable. Other: Pericholecystic predominant right upper quadrant stranding with trace fluid in the  right pericolic gutter without walled off collection. No pneumoperitoneum. Musculoskeletal: Multilevel degenerative changes spine with bony ankylosis of the thoracolumbar spine. No acute osseous abnormality.  IMPRESSION: 1. Findings consistent with acute calculus cholecystitis. No evidence of perforation. No biliary ductal dilation. 2. Bibasilar airspace consolidations may represent atelectasis or pneumonia. 3. Postsurgical changes of gastric bypass. Prominent loops of contrast filled small bowel in the left upper quadrant without abrupt transition to decompressed bowel. Findings may represent ileus. 4. Aortic atherosclerosis. Aortic Atherosclerosis (ICD10-I70.0). Electronically Signed   By: Dahlia Bailiff MD   On: 11/09/2020 00:36   IR Perc Cholecystostomy  Result Date: 11/09/2020 INDICATION: Acute calculus cholecystitis EXAM: ULTRASOUND FLUOROSCOPIC PERCUTANEOUS TRANSHEPATIC CHOLECYSTOSTOMY MEDICATIONS: Patient is already receiving IV antibiotics as an inpatient; The antibiotic was administered within an appropriate time frame prior to the initiation of the procedure. ANESTHESIA/SEDATION: Moderate (conscious) sedation was employed during this procedure. A total of Versed 1.0 mg and Fentanyl 50 mcg was administered intravenously. Moderate Sedation Time: 10 minutes. The patient's level of consciousness and vital signs were monitored continuously by radiology nursing throughout the procedure under my direct supervision. FLUOROSCOPY TIME:  Fluoroscopy Time: 0 minutes 30 seconds (6 mGy). COMPLICATIONS: None immediate. PROCEDURE: Informed written consent was obtained from the patient after a thorough discussion of the procedural risks, benefits and alternatives. All questions were addressed. Maximal Sterile Barrier Technique was utilized including caps, mask, sterile gowns, sterile gloves, sterile drape, hand hygiene and skin antiseptic. A timeout was performed prior to the initiation of the procedure. Previous imaging reviewed. Preliminary ultrasound performed in the right upper quadrant. The thick-walled edematous gallbladder was localized and marked for a transhepatic approach below the right subcostal margin. Under  sterile conditions and local anesthesia, a 21 gauge needle was advanced percutaneously via transhepatic approach into the gallbladder. Needle position confirmed with ultrasound. Images obtained for documentation. Bile sample obtained and sent for culture. Guidewire inserted followed by Accustick dilator set. Amplatz guidewire inserted followed by tract dilatation insert a 10 French drain. Drain catheter position confirmed with ultrasound and fluoroscopy. Images obtained for documentation. Catheter secured with a Prolene suture and connected to external gravity drainage bag. Sterile dressing applied. No immediate complication. Patient tolerated the procedure well. IMPRESSION: Successful ultrasound and fluoroscopic 10 French percutaneous transhepatic cholecystostomy. Electronically Signed   By: Jerilynn Mages.  Shick M.D.   On: 11/09/2020 13:17   DG CHEST PORT 1 VIEW  Result Date: 11/08/2020 CLINICAL DATA:  Central line placement EXAM: PORTABLE CHEST 1 VIEW COMPARISON:  None. FINDINGS: Right IJ approach central venous catheter tip terminates near the superior cavoatrial junction. Notable cardiomegaly though may be somewhat accentuated by portable technique and low volumes. Calcified aorta. Low lung volumes and atelectatic changes including more bandlike areas of subsegmental atelectasis towards the bases. Additional hazy and patchy opacities could reflect further atelectatic volume loss though early airspace disease is difficult to exclude. Suspect at least small bilateral effusions. No pneumothorax. No acute osseous or soft tissue abnormality. Telemetry leads overlie the chest. IMPRESSION: Right IJ approach central venous catheter tip terminates at the superior cavoatrial junction. Low volumes and atelectasis including more bandlike subsegmental atelectasis in the bases. Patchy opacities could reflect further volume loss or early airspace disease. Suspect small bilateral effusions. Enlarged cardiac silhouette though may be  accentuated by portable technique. Electronically Signed   By: Lovena Le M.D.   On: 11/08/2020 16:36   DG Abd Portable 1V  Result Date: 11/08/2020 CLINICAL DATA:  Check gastric catheter placement EXAM: PORTABLE ABDOMEN -  1 VIEW COMPARISON:  Film from earlier in the same day. FINDINGS: Gastric catheter is noted within the stomach. Proximal side port however is noted in the distal esophagus. This should be advanced several cm deeper into the stomach. Multiple dilated loops of small bowel are again identified consistent with at least partial small bowel obstruction. IMPRESSION: Persistent small bowel dilatation. Gastric catheter as described. This should be advanced deeper into the stomach. Electronically Signed   By: Inez Catalina M.D.   On: 11/08/2020 21:53   DG Abd Portable 1V  Result Date: 11/08/2020 CLINICAL DATA:  Abdominal pain and distention. EXAM: PORTABLE ABDOMEN - 1 VIEW COMPARISON:  No prior. FINDINGS: Surgical clips right upper quadrant and pelvis. Multiple dilated loops of small bowel noted. Relative paucity of colonic gas. Findings suggest small bowel obstruction. Follow-up abdominal series suggested for further evaluation. It is difficult to evaluate for free air as hemidiaphragms incompletely imaged. IVC filter noted. Degenerative change lumbar spine. IMPRESSION: Multiple dilated loops of small bowel noted. Relative paucity of colonic gas. Findings suggest small bowel obstruction. Follow-up abdominal series suggested for further evaluation. Electronically Signed   By: Marcello Moores  Register   On: 11/08/2020 16:35   US Abdomen Limited RUQ (LIVER/GB)  Result Date: 11/08/2020 CLINICAL DATA:  Right upper quadrant abdominal pain EXAM: ULTRASOUND ABDOMEN LIMITED RIGHT UPPER QUADRANT COMPARISON:  None. FINDINGS: Gallbladder: The gallbladder is mildly distended and contains numerous layering gravel-like gallstones. There is mild pericholecystic fluid identified along the hepatics surface as well as  mild gallbladder wall thickening noted. The sonographic Percell Miller sign is reportedly positive. Altogether, the findings are in keeping with changes of acute calculus cholecystitis. Common bile duct: Diameter: 4 mm Liver: No focal lesion identified. Within normal limits in parenchymal echogenicity. Portal vein is patent on color Doppler imaging with normal direction of blood flow towards the liver. Other: No ascites IMPRESSION: Acute calculus cholecystitis. No intra or extrahepatic biliary ductal dilation. Electronically Signed   By: Fidela Salisbury MD   On: 11/08/2020 22:20    Anti-infectives: Anti-infectives (From admission, onward)   Start     Dose/Rate Route Frequency Ordered Stop   11/08/20 1600  ceFEPIme (MAXIPIME) 2 g in sodium chloride 0.9 % 100 mL IVPB        2 g 200 mL/hr over 30 Minutes Intravenous Every 12 hours 11/08/20 1456     11/08/20 1500  metroNIDAZOLE (FLAGYL) IVPB 500 mg        500 mg 100 mL/hr over 60 Minutes Intravenous Every 8 hours 11/08/20 1404         Assessment/Plan 65 yo male with multiple medical comorbidities with acute calculous cholecystitis. S/p percutaneous cholecystostomy. - Abdomen soft, minimal NG output and passing flatus. Remove NG today, ok for sips of clear liquids. - Drain cultures pending, continue broad-spectrum antibiotics - Bilirubin stable at 3.5 today. Likely secondary to cholecystitis. Alk phos is normal and bile ducts were non-dilated on imaging, which does not suggest ongoing biliary obstruction. Continue to trend. If this rises, patient will need an MRCP. - Surgery will continue to follow   LOS: 2 days    Michaelle Birks, MD [facility] Surgery General, Hepatobiliary and Pancreatic Surgery 11/10/20 10:15 AM

## 2020-11-10 NOTE — Progress Notes (Signed)
Referring Physician(s): Alferd Apa, [place]  Supervising Physician: Jacqulynn Cadet  Patient Status:  [facility] - In-pt  Chief Complaint: Follow up percutaneous cholecystostomy placed 5/20 in IR  Subjective:  Patient sleeping upon arrival to room, wife in chair sleeping as well. Patient arouses briefly to loud verbal and tactile cues, states he feels ok and then returns to sleep.  Allergies: Penicillamine and Penicillins  Medications: Prior to Admission medications   Medication Sig Start Date End Date Taking? Authorizing Provider  amLODipine (NORVASC) 10 MG tablet Take 10 mg by mouth every morning. 10/18/20  Yes [provider]  aspirin EC 81 MG tablet Take 162 mg by mouth daily. Swallow whole.   Yes [provider]  furosemide (LASIX) 20 MG tablet Take 40 mg by mouth daily. 09/18/20  Yes [provider]  hydrOXYzine (ATARAX/VISTARIL) 10 MG tablet Take 10-20 mg by mouth at bedtime. 10/29/20  Yes [provider]  irbesartan (AVAPRO) 150 MG tablet Take 150 mg by mouth daily. 08/30/20  Yes [provider]  metFORMIN (GLUCOPHAGE) 500 MG tablet Take 1,000 mg by mouth 2 (two) times daily. 09/14/20  Yes [provider]  metoprolol succinate (TOPROL-XL) 50 MG 24 hr tablet Take 50 mg by mouth every morning. 08/11/20  Yes [provider]  NONFORMULARY OR COMPOUNDED ITEM Apply 1 application topically 2 (two) times daily. TAC 0.1% cream + Hydrophor c&m 0.5% No more than a week at a time 08/13/20  Yes [provider]  rosuvastatin (CRESTOR) 10 MG tablet Take 10 mg by mouth at bedtime. 09/06/20  Yes [provider]     Vital Signs: BP 111/74   Pulse (!) 112   Temp 98.5 F (36.9 C) (Oral)   Resp (!) 27   Ht '5\' 10"'$  (1.778 m)   Wt 216 lb 0.8 oz (98 kg)   SpO2 93%   BMI 31.00 kg/m   Physical Exam Vitals reviewed.  Constitutional:      General: He is not in acute distress. HENT:     Head: Normocephalic.   Cardiovascular:     Rate and Rhythm: Tachycardia present.  Pulmonary:     Effort: Pulmonary effort is normal.  Abdominal:     General: There is no distension.     Palpations: Abdomen is soft.     Tenderness: There is no abdominal tenderness.     Comments: (+) RUQ drain to gravity with bilious/purulent appearing output. Insertion site clean, dry, dressed appropriately.  Skin:    General: Skin is warm and dry.     Coloration: Skin is not jaundiced.  Neurological:     Mental Status: Mental status is at baseline.     Imaging: CT ABDOMEN PELVIS WO CONTRAST  Result Date: 11/09/2020 CLINICAL DATA:  Nausea and vomiting EXAM: CT ABDOMEN AND PELVIS WITHOUT CONTRAST TECHNIQUE: Multidetector CT imaging of the abdomen and pelvis was performed following the standard protocol without IV contrast. COMPARISON:  Abdominal ultrasound Nov 08, 2020. FINDINGS: Lower chest: Evaluation of the lung bases is limited by respiratory motion. Bibasilar airspace consolidations. Partially visualized central venous catheter with tip overlying the superior cavoatrial junction. Coronary artery calcifications. Hepatobiliary: Unremarkable noncontrast appearance of the hepatic parenchyma. No portal venous gas. Gallbladder is distended with layering cholelithiasis wall thickening and pericholecystic fluid consistent with acute calculus cholecystitis. No biliary ductal dilation. Pancreas: Fatty replacement of the pancreatic parenchyma. No pancreatic ductal dilation. Spleen: Within normal limits. Adrenals/Urinary Tract: Bilateral adrenal glands are unremarkable. No hydronephrosis. No contour deforming  renal masses. Urinary bladder is decompressed around a Foley catheter. Stomach/Bowel: Postsurgical changes of gastric bypass. Nasogastric tube with tip in the Roux limb. Prominent loops of contrast filled small bowel in the left upper quadrant without abrupt transition to decompressed bowel. Appendix not visualized. No colonic wall or  adjacent inflammation. Vascular/Lymphatic: IVC filter. Aortic atherosclerosis without aneurysmal dilation. No pathologically enlarged abdominal or pelvic lymph nodes. Reproductive: Prostate is unremarkable. Other: Pericholecystic predominant right upper quadrant stranding with trace fluid in the right pericolic gutter without walled off collection. No pneumoperitoneum. Musculoskeletal: Multilevel degenerative changes spine with bony ankylosis of the thoracolumbar spine. No acute osseous abnormality. IMPRESSION: 1. Findings consistent with acute calculus cholecystitis. No evidence of perforation. No biliary ductal dilation. 2. Bibasilar airspace consolidations may represent atelectasis or pneumonia. 3. Postsurgical changes of gastric bypass. Prominent loops of contrast filled small bowel in the left upper quadrant without abrupt transition to decompressed bowel. Findings may represent ileus. 4. Aortic atherosclerosis. Aortic Atherosclerosis (ICD10-I70.0). Electronically Signed   By: Dahlia Bailiff MD   On: 11/09/2020 00:36   IR Perc Cholecystostomy  Result Date: 11/09/2020 INDICATION: Acute calculus cholecystitis EXAM: ULTRASOUND FLUOROSCOPIC PERCUTANEOUS TRANSHEPATIC CHOLECYSTOSTOMY MEDICATIONS: Patient is already receiving IV antibiotics as an inpatient; The antibiotic was administered within an appropriate time frame prior to the initiation of the procedure. ANESTHESIA/SEDATION: Moderate (conscious) sedation was employed during this procedure. A total of Versed 1.0 mg and Fentanyl 50 mcg was administered intravenously. Moderate Sedation Time: 10 minutes. The patient's level of consciousness and vital signs were monitored continuously by radiology nursing throughout the procedure under my direct supervision. FLUOROSCOPY TIME:  Fluoroscopy Time: 0 minutes 30 seconds (6 mGy). COMPLICATIONS: None immediate. PROCEDURE: Informed written consent was obtained from the patient after a thorough discussion of the  procedural risks, benefits and alternatives. All questions were addressed. Maximal Sterile Barrier Technique was utilized including caps, mask, sterile gowns, sterile gloves, sterile drape, hand hygiene and skin antiseptic. A timeout was performed prior to the initiation of the procedure. Previous imaging reviewed. Preliminary ultrasound performed in the right upper quadrant. The thick-walled edematous gallbladder was localized and marked for a transhepatic approach below the right subcostal margin. Under sterile conditions and local anesthesia, a 21 gauge needle was advanced percutaneously via transhepatic approach into the gallbladder. Needle position confirmed with ultrasound. Images obtained for documentation. Bile sample obtained and sent for culture. Guidewire inserted followed by Accustick dilator set. Amplatz guidewire inserted followed by tract dilatation insert a 10 French drain. Drain catheter position confirmed with ultrasound and fluoroscopy. Images obtained for documentation. Catheter secured with a Prolene suture and connected to external gravity drainage bag. Sterile dressing applied. No immediate complication. Patient tolerated the procedure well. IMPRESSION: Successful ultrasound and fluoroscopic 10 French percutaneous transhepatic cholecystostomy. Electronically Signed   By: Jerilynn Mages.  Shick M.D.   On: 11/09/2020 13:17   DG CHEST PORT 1 VIEW  Result Date: 11/08/2020 CLINICAL DATA:  Central line placement EXAM: PORTABLE CHEST 1 VIEW COMPARISON:  None. FINDINGS: Right IJ approach central venous catheter tip terminates near the superior cavoatrial junction. Notable cardiomegaly though may be somewhat accentuated by portable technique and low volumes. Calcified aorta. Low lung volumes and atelectatic changes including more bandlike areas of subsegmental atelectasis towards the bases. Additional hazy and patchy opacities could reflect further atelectatic volume loss though early airspace disease is  difficult to exclude. Suspect at least small bilateral effusions. No pneumothorax. No acute osseous or soft tissue abnormality. Telemetry leads overlie the chest. IMPRESSION: Right IJ  approach central venous catheter tip terminates at the superior cavoatrial junction. Low volumes and atelectasis including more bandlike subsegmental atelectasis in the bases. Patchy opacities could reflect further volume loss or early airspace disease. Suspect small bilateral effusions. Enlarged cardiac silhouette though may be accentuated by portable technique. Electronically Signed   By: Lovena Le M.D.   On: 11/08/2020 16:36   DG Abd Portable 1V  Result Date: 11/08/2020 CLINICAL DATA:  Check gastric catheter placement EXAM: PORTABLE ABDOMEN - 1 VIEW COMPARISON:  Film from earlier in the same day. FINDINGS: Gastric catheter is noted within the stomach. Proximal side port however is noted in the distal esophagus. This should be advanced several cm deeper into the stomach. Multiple dilated loops of small bowel are again identified consistent with at least partial small bowel obstruction. IMPRESSION: Persistent small bowel dilatation. Gastric catheter as described. This should be advanced deeper into the stomach. Electronically Signed   By: Inez Catalina M.D.   On: 11/08/2020 21:53   DG Abd Portable 1V  Result Date: 11/08/2020 CLINICAL DATA:  Abdominal pain and distention. EXAM: PORTABLE ABDOMEN - 1 VIEW COMPARISON:  No prior. FINDINGS: Surgical clips right upper quadrant and pelvis. Multiple dilated loops of small bowel noted. Relative paucity of colonic gas. Findings suggest small bowel obstruction. Follow-up abdominal series suggested for further evaluation. It is difficult to evaluate for free air as hemidiaphragms incompletely imaged. IVC filter noted. Degenerative change lumbar spine. IMPRESSION: Multiple dilated loops of small bowel noted. Relative paucity of colonic gas. Findings suggest small bowel obstruction.  Follow-up abdominal series suggested for further evaluation. Electronically Signed   By: Marcello Moores  Register   On: 11/08/2020 16:35   US Abdomen Limited RUQ (LIVER/GB)  Result Date: 11/08/2020 CLINICAL DATA:  Right upper quadrant abdominal pain EXAM: ULTRASOUND ABDOMEN LIMITED RIGHT UPPER QUADRANT COMPARISON:  None. FINDINGS: Gallbladder: The gallbladder is mildly distended and contains numerous layering gravel-like gallstones. There is mild pericholecystic fluid identified along the hepatics surface as well as mild gallbladder wall thickening noted. The sonographic Percell Miller sign is reportedly positive. Altogether, the findings are in keeping with changes of acute calculus cholecystitis. Common bile duct: Diameter: 4 mm Liver: No focal lesion identified. Within normal limits in parenchymal echogenicity. Portal vein is patent on color Doppler imaging with normal direction of blood flow towards the liver. Other: No ascites IMPRESSION: Acute calculus cholecystitis. No intra or extrahepatic biliary ductal dilation. Electronically Signed   By: Fidela Salisbury MD   On: 11/08/2020 22:20    Labs:  CBC: Recent Labs    11/08/20 1426 11/09/20 0358 11/10/20 0213  WBC 17.3* 22.5* 21.0*  HGB 11.0* 10.1* 10.2*  HCT 35.9* 32.8* 31.7*  PLT 145* 100* 92*    COAGS: Recent Labs    11/08/20 1426  INR 1.4*    BMP: Recent Labs    11/08/20 1426 11/09/20 0358 11/10/20 0213  NA 137 132* 134*  K 4.5 4.8 4.4  CL 105 105 105  CO2 18* 15* 16*  GLUCOSE 136* 147* 109*  BUN 28* 35* 48*  CALCIUM 8.4* 7.4* 7.5*  CREATININE 1.99* 2.38* 3.38*  GFRNONAA 36* 29* 19*    LIVER FUNCTION TESTS: Recent Labs    11/08/20 1426 11/09/20 0358 11/10/20 0213  BILITOT 3.2* 3.5* 3.5*  AST 392* 200* 125*  ALT 348* 263* 199*  ALKPHOS 137* 103 125  PROT 6.5 5.6* 5.7*  ALBUMIN 3.2* 2.6* 2.6*    Assessment and Plan:  65 y/o M s/p percutaneous cholecystostomy placement  yesterday in IR seen for follow up. Her reports  feeling ok to me but is too sleepy to discuss further, per chart review reported abdominal pain had improved some.   RUQ drain with purulent bilious appearing output, flushes easily, insertion site is unremarkable. Per I/O 200 cc output so far. Patient afebrile, WBC improved slightly from yesterday, preliminary cx (+) gram variable rod and gram positive cocci.  Continue current drain management, IR will continue to follow along - plans per CCM/general surgery.   Please call with questions or concerns.  Electronically Signed: Joaquim Nam, PA-C 11/10/2020, 11:43 AM   I spent a total of 15 Minutes at the the patient's bedside AND on the patient's hospital floor or unit, greater than 50% of which was counseling/coordinating care for percutaneous cholecystostomy follow up.

## 2020-11-10 NOTE — Progress Notes (Addendum)
NAME:  Tom Scott, MRN:  [id number], DOB:  1955/07/31, LOS: 2 ADMISSION DATE:  11/08/2020, CONSULTATION DATE: 11/08/2020  CHIEF COMPLAINT:  Septic shock   History of Present Illness:   44 yoM with a history of atrial fibrillation on xarelto (not taking since 3/22), hypertension, DM, and HLD who initially presented to [facility] in [place] for chest and epigastric pain. Transferred to [facility] due to septic shock requiring pressors and concerns for choledocholithiasis.   He reports 2 weeks of intermittent abdominal pain that worsened 3 days ago. Endorses nausea, denies emesis, fevers, diarrhea. Sign out prior to transfer expressed concern for choledocholithiasis. On arrival, documentaion included a CT abdomen with cholelithiasis no cholecystitis. He also had lactic acidosis, transaminitis and an elevated t bili. He was also with atrial fibrillation with RVR and hypotensive started on pressors. Of note, he stopped taking Xarelto in March 2022 secondary to finances and has been taking aspirin. He   Past Medical History:  Atrial fibrillation HTN DM HLD Hx of gastric bypass   Significant Hospital Events:  - 5/19 admitted to [facility] ICU - 5/20 repeat US and CT abdomen showed acute calculous cholecystitis. IR placed percutaneous drain    Consults:  GI, signed off  General surgery IR  Procedures:  - 5/19 central venous catheter  - 5/29 percutaneous cholecystostomy tube placed    Significant Diagnostic Tests:  US abdomen and CT abdomen show acute calculous cholecystitis   Micro Data:  BC from [facility] and repeated on admission  Antimicrobials:  Metronidazole 5/18> Cefepime 5/19> Levaquin 5/18   Interim History / Subjective:   No acute overnight events. Patient reports feeling much better this morning. States he is passing gas and his abdominal pain is improving. Denies any complaints this morning.   Objective   Blood pressure 109/69, pulse (!) 102,  temperature 98.6 F (37 C), temperature source Oral, resp. rate (!) 24, height '5\' 10"'$  (1.778 m), weight 98 kg, SpO2 91 %. CVP:  [7 mmHg-8 mmHg] 8 mmHg      Intake/Output Summary (Last 24 hours) at 11/10/2020 [id number] Last data filed at 11/10/2020 0600 Gross per 24 hour  Intake 4458.04 ml  Output 605 ml  Net 3853.04 ml   Filed Weights   11/08/20 1600  Weight: 98 kg    Examination: General: elderly male, acutely ill appearing, no acute distress HENT: antiicteric sclerae, dry membranes, on room air  Lungs: CTA bilaterally, normal work of breathing Cardiovascular: s1, s2, tachycardic, no murmurs rubs or gallops Abdomen: soft, bowel sounds present, tenderness to palpation in upper quadrants Extremities: no edema Neuro: alert and oriented x3, following commands Skin: warm and dry  Resolved Hospital Problem list   none  Assessment & Plan:   Septic shock Acute calculous cholecystitis  Lactic acidosis  Transaminitis - S/p percutaneous cholecystostomy yesterday - Remains on phenylephrine this morning maintaining a MAP>65, will wean as tolerated - Continue cefepime and metronidazole  - General surgery on board, appreciate recommendations - Trend LFTs, overall improving. If LFTs worsen may need to consider MRCP   Afib w/ RVR - Likely secondary to sepsis  - Overall much improved  - Continue amiodarone, can transition to PO today if tolerating oral intake - Not taking xarelto since 3/22 secondary to finances, taking aspirin  - Continue heparin - Continue telemetry   AKI - Likely in the setting of septic shock and use of contrast  - Cr 3.38, up from 2.38 - Unclear baseline renal function although reports  no hx of renal dysfunction - Trend BMP - Avoid nephrotoxic agents    HTN - Hold home antihypertensives  - Pressor support as above  DM - SSI  - CBG monitoring  HLD - Continue statin once home meds are confirmed     Best practice (evaluated daily)  Diet: NPO, can  advance to CLD today  Pain/Anxiety/Delirium protocol (if indicated): IV dilaudid VAP protocol (if indicated): n/a DVT prophylaxis: Heparin GI prophylaxis: PPI Glucose control: SSI Mobility: bedrest Disposition: ICU  Goals of Care:  Last date of multidisciplinary goals of care discussion:5/21 Family and staff present: Yes Summary of discussion: continue current plan of care Follow up goals of care discussion due: n/a Code Status: Full code  Labs   CBC: Recent Labs  Lab 11/08/20 1426 11/09/20 0358 11/10/20 0213  WBC 17.3* 22.5* 21.0*  HGB 11.0* 10.1* 10.2*  HCT 35.9* 32.8* 31.7*  MCV 85.7 87.0 85.0  PLT 145* 100* 92*    Basic Metabolic Panel: Recent Labs  Lab 11/08/20 1426 11/09/20 0358 11/10/20 0213  NA 137 132* 134*  K 4.5 4.8 4.4  CL 105 105 105  CO2 18* 15* 16*  GLUCOSE 136* 147* 109*  BUN 28* 35* 48*  CREATININE 1.99* 2.38* 3.38*  CALCIUM 8.4* 7.4* 7.5*   GFR: Estimated Creatinine Clearance: 24.9 mL/min (A) (by C-G formula based on SCr of 3.38 mg/dL (H)). Recent Labs  Lab 11/08/20 1426 11/08/20 2035 11/09/20 0105 11/09/20 0358 11/10/20 0213  WBC 17.3*  --   --  22.5* 21.0*  LATICACIDVEN  --  4.8* 4.5*  --   --     Liver Function Tests: Recent Labs  Lab 11/08/20 1426 11/09/20 0358 11/10/20 0213  AST 392* 200* 125*  ALT 348* 263* 199*  ALKPHOS 137* 103 125  BILITOT 3.2* 3.5* 3.5*  PROT 6.5 5.6* 5.7*  ALBUMIN 3.2* 2.6* 2.6*   Recent Labs  Lab 11/08/20 1428 11/09/20 0358  LIPASE 79* 27   No results for input(s): AMMONIA in the last 168 hours.  ABG No results found for: PHART, PCO2ART, PO2ART, HCO3, TCO2, ACIDBASEDEF, O2SAT   Coagulation Profile: Recent Labs  Lab 11/08/20 1426  INR 1.4*    Cardiac Enzymes: No results for input(s): CKTOTAL, CKMB, CKMBINDEX, TROPONINI in the last 168 hours.  HbA1C: Hgb A1c MFr Bld  Date/Time Value Ref Range Status  11/08/2020 02:26 PM 8.3 (H) 4.8 - 5.6 % Final    Comment:    (NOTE) Pre  diabetes:          5.7%-6.4%  Diabetes:              >6.4%  Glycemic control for   <7.0% adults with diabetes     CBG: Recent Labs  Lab 11/09/20 1306 11/09/20 1530 11/09/20 1946 11/09/20 2323 11/10/20 0403  GLUCAP 121* 122* 106* 124* 121*     Past Medical History:  He,  has a past medical history of Atrial fibrillation ([place]), Diabetes mellitus ([place]), and Hypertension.   Surgical History:   Past Surgical History:  Procedure Laterality Date  . GASTRIC BYPASS    . IR PERC CHOLECYSTOSTOMY  11/09/2020     Social History:      Family History:  His family history is not on file.   Allergies Allergies  Allergen Reactions  . Penicillamine     Other reaction(s): lips swell  . Penicillins     Other reaction(s): Swelling  Legacy System: CCA Onset Date: <blank> Substance Legacy/Cerner: penicillins / penicillins (  Legacy value) Category: Drug Severity Legacy/Cerner: <blank> / Unknown Reaction(s): swelling Comments: <blank>  Legacy System: CCA Onset Date: <blank> Substance Legacy/Cerner: ampicillin / ampicillin (Legacy value) Category: Drug Severity Legacy/Cerner: <blank> / Unknown Reaction(s): swelling Comments: <blank>  Legacy System: CCA Onset Date: <blank> Substance Legacy/Cerner: ampicillin / ampicillin (Legacy value) Category: Drug Severity Legacy/Cerner: <blank> / Unknown Reaction(s): swelling Comments: <blank>  Legacy System: CCA Onset Date: <blank> Substance Legacy/Cerner: penicillins / penicillins (Legacy value) Category: Drug Severity Legacy/Cerner: <blank> / Unknown Reaction(s): swelling Comments: <blank>      Home Medications  Prior to Admission medications   Not on [place], DO PGY-2 IMTS

## 2020-11-10 NOTE — Progress Notes (Signed)
ANTICOAGULATION CONSULT NOTE - follow up  Pharmacy Consult for heparin Indication: atrial fibrillation  Allergies  Allergen Reactions  . Penicillamine     Other reaction(s): lips swell  . Penicillins     Other reaction(s): Swelling  Legacy System: CCA Onset Date: <blank> Substance Legacy/Cerner: penicillins / penicillins (Legacy value) Category: Drug Severity Legacy/Cerner: <blank> / Unknown Reaction(s): swelling Comments: <blank>  Legacy System: CCA Onset Date: <blank> Substance Legacy/Cerner: ampicillin / ampicillin (Legacy value) Category: Drug Severity Legacy/Cerner: <blank> / Unknown Reaction(s): swelling Comments: <blank>  Legacy System: CCA Onset Date: <blank> Substance Legacy/Cerner: ampicillin / ampicillin (Legacy value) Category: Drug Severity Legacy/Cerner: <blank> / Unknown Reaction(s): swelling Comments: <blank>  Legacy System: CCA Onset Date: <blank> Substance Legacy/Cerner: penicillins / penicillins (Legacy value) Category: Drug Severity Legacy/Cerner: <blank> / Unknown Reaction(s): swelling Comments: <blank>     Patient Measurements: Height: '5\' 10"'$  (177.8 cm) Weight: 98 kg (216 lb 0.8 oz) IBW/kg (Calculated) : 73 Heparin Dosing Weight: 93.3 kg  Vital Signs: Temp: 98.4 F (36.9 C) (05/20 2323) Temp Source: Oral (05/20 2323) BP: 110/67 (05/21 0300) Pulse Rate: 114 (05/21 0300)  Labs: Recent Labs    11/08/20 1426 11/09/20 0358 11/10/20 0213  HGB 11.0* 10.1* 10.2*  HCT 35.9* 32.8* 31.7*  PLT 145* 100* 92*  LABPROT 17.3*  --   --   INR 1.4*  --   --   HEPARINUNFRC  --   --  0.14*  CREATININE 1.99* 2.38* 3.38*    Estimated Creatinine Clearance: 24.9 mL/min (A) (by C-G formula based on SCr of 3.38 mg/dL (H)).   Medical History: Past Medical History:  Diagnosis Date  . Atrial fibrillation ([place])   . Diabetes mellitus ([place])   . Hypertension     Medications:  Scheduled:  . chlorhexidine  15 mL Mouth Rinse BID  .  Chlorhexidine Gluconate Cloth  6 each Topical Daily  . insulin aspart  0-15 Units Subcutaneous Q4H  . mouth rinse  15 mL Mouth Rinse q12n4p  . pantoprazole (PROTONIX) IV  40 mg Intravenous Q24H  . sodium chloride flush  10-40 mL Intracatheter Q12H  . sodium chloride flush  5 mL Intracatheter Q8H    Assessment: 61 yof presenting with cholelithiasis - now found to be in Afib with RVR.  Underwent perc cholecystomy today - was on Xarelto PTA (but off since March d/t cost).   Initial 6 hr HL is 0.14 on Heparin rate 1300 units/hr.  Hgb 10.2 stable, plt 100 > 92k  No issues with heparin infusion and no s/sx of bleeding per RN's report.   Goal of Therapy:  Heparin level 0.3-0.7 units/ml Monitor platelets by anticoagulation protocol: Yes   Plan:  Increase heparin infusion rate to 1500 units/hr  heparin level in 6 hours after start Monitor daily HL, CBC, and for s/sx of bleeding   Nicole Cella, RPh Clinical Pharmacist Please check AMION for all [place] phone numbers After 10:00 PM, call [place]  11/10/2020 3:45 AM

## 2020-11-11 DIAGNOSIS — R6521 Severe sepsis with septic shock: Secondary | ICD-10-CM | POA: Diagnosis not present

## 2020-11-11 DIAGNOSIS — A419 Sepsis, unspecified organism: Secondary | ICD-10-CM | POA: Diagnosis not present

## 2020-11-11 LAB — COMPREHENSIVE METABOLIC PANEL
ALT: 117 U/L — ABNORMAL HIGH (ref 0–44)
AST: 64 U/L — ABNORMAL HIGH (ref 15–41)
Albumin: 2.1 g/dL — ABNORMAL LOW (ref 3.5–5.0)
Alkaline Phosphatase: 173 U/L — ABNORMAL HIGH (ref 38–126)
Anion gap: 12 (ref 5–15)
BUN: 61 mg/dL — ABNORMAL HIGH (ref 8–23)
CO2: 13 mmol/L — ABNORMAL LOW (ref 22–32)
Calcium: 7.6 mg/dL — ABNORMAL LOW (ref 8.9–10.3)
Chloride: 110 mmol/L (ref 98–111)
Creatinine, Ser: 4.64 mg/dL — ABNORMAL HIGH (ref 0.61–1.24)
GFR, Estimated: 13 mL/min — ABNORMAL LOW (ref >60)
Glucose, Bld: 123 mg/dL — ABNORMAL HIGH (ref 70–99)
Potassium: 4.2 mmol/L (ref 3.5–5.1)
Sodium: 135 mmol/L (ref 135–145)
Total Bilirubin: 3.8 mg/dL — ABNORMAL HIGH (ref 0.3–1.2)
Total Protein: 5.1 g/dL — ABNORMAL LOW (ref 6.5–8.1)

## 2020-11-11 LAB — CBC
HCT: 29.4 % — ABNORMAL LOW (ref 39.0–52.0)
Hemoglobin: 9.5 g/dL — ABNORMAL LOW (ref 13.0–17.0)
MCH: 26.8 pg (ref 26.0–34.0)
MCHC: 32.3 g/dL (ref 30.0–36.0)
MCV: 82.8 fL (ref 80.0–100.0)
Platelets: 71 10*3/uL — ABNORMAL LOW (ref 150–400)
RBC: 3.55 MIL/uL — ABNORMAL LOW (ref 4.22–5.81)
RDW: 17.4 % — ABNORMAL HIGH (ref 11.5–15.5)
WBC: 18.6 10*3/uL — ABNORMAL HIGH (ref 4.0–10.5)
nRBC: 0 % (ref 0.0–0.2)

## 2020-11-11 LAB — POCT I-STAT 7, (LYTES, BLD GAS, ICA,H+H)
Acid-base deficit: 12 mmol/L — ABNORMAL HIGH (ref 0.0–2.0)
Bicarbonate: 12.2 mmol/L — ABNORMAL LOW (ref 20.0–28.0)
Calcium, Ion: 1.14 mmol/L — ABNORMAL LOW (ref 1.15–1.40)
HCT: 30 % — ABNORMAL LOW (ref 39.0–52.0)
Hemoglobin: 10.2 g/dL — ABNORMAL LOW (ref 13.0–17.0)
O2 Saturation: 93 %
Potassium: 3.9 mmol/L (ref 3.5–5.1)
Sodium: 137 mmol/L (ref 135–145)
TCO2: 13 mmol/L — ABNORMAL LOW (ref 22–32)
pCO2 arterial: 23.3 mmHg — ABNORMAL LOW (ref 32.0–48.0)
pH, Arterial: 7.328 — ABNORMAL LOW (ref 7.350–7.450)
pO2, Arterial: 69 mmHg — ABNORMAL LOW (ref 83.0–108.0)

## 2020-11-11 LAB — GLUCOSE, CAPILLARY
Glucose-Capillary: 103 mg/dL — ABNORMAL HIGH (ref 70–99)
Glucose-Capillary: 121 mg/dL — ABNORMAL HIGH (ref 70–99)
Glucose-Capillary: 123 mg/dL — ABNORMAL HIGH (ref 70–99)
Glucose-Capillary: 124 mg/dL — ABNORMAL HIGH (ref 70–99)
Glucose-Capillary: 126 mg/dL — ABNORMAL HIGH (ref 70–99)
Glucose-Capillary: 131 mg/dL — ABNORMAL HIGH (ref 70–99)

## 2020-11-11 LAB — BASIC METABOLIC PANEL
Anion gap: 13 (ref 5–15)
BUN: 63 mg/dL — ABNORMAL HIGH (ref 8–23)
CO2: 13 mmol/L — ABNORMAL LOW (ref 22–32)
Calcium: 7.7 mg/dL — ABNORMAL LOW (ref 8.9–10.3)
Chloride: 107 mmol/L (ref 98–111)
Creatinine, Ser: 4.89 mg/dL — ABNORMAL HIGH (ref 0.61–1.24)
GFR, Estimated: 12 mL/min — ABNORMAL LOW (ref >60)
Glucose, Bld: 119 mg/dL — ABNORMAL HIGH (ref 70–99)
Potassium: 4 mmol/L (ref 3.5–5.1)
Sodium: 133 mmol/L — ABNORMAL LOW (ref 135–145)

## 2020-11-11 LAB — HEPARIN LEVEL (UNFRACTIONATED)
Heparin Unfractionated: 0.21 IU/mL — ABNORMAL LOW (ref 0.30–0.70)
Heparin Unfractionated: 0.22 IU/mL — ABNORMAL LOW (ref 0.30–0.70)

## 2020-11-11 MED ORDER — SODIUM BICARBONATE 8.4 % IV SOLN
INTRAVENOUS | Status: DC
  Filled 2020-11-11: qty 1000

## 2020-11-11 MED ORDER — FUROSEMIDE 10 MG/ML IJ SOLN
60.0000 mg | Freq: Once | INTRAMUSCULAR | Status: AC
  Administered 2020-11-11: 60 mg via INTRAVENOUS
  Filled 2020-11-11: qty 6

## 2020-11-11 MED ORDER — AMIODARONE HCL 200 MG PO TABS
200.0000 mg | ORAL_TABLET | Freq: Every day | ORAL | Status: DC
  Administered 2020-11-11: 200 mg via ORAL
  Filled 2020-11-11 (×2): qty 1

## 2020-11-11 NOTE — Progress Notes (Addendum)
NAME:  Tom Scott, MRN:  [id number], DOB:  May 19, 1956, LOS: 3 ADMISSION DATE:  11/08/2020, CONSULTATION DATE: 11/08/2020  CHIEF COMPLAINT:  Septic shock   History of Present Illness:   28 yoM with a history of atrial fibrillation on xarelto (not taking since 3/22), hypertension, DM, and HLD who initially presented to [facility] in [place] for chest and epigastric pain. Transferred to [facility] due to septic shock requiring pressors and concerns for choledocholithiasis.   He reports 2 weeks of intermittent abdominal pain that worsened 3 days ago. Endorses nausea, denies emesis, fevers, diarrhea. Sign out prior to transfer expressed concern for choledocholithiasis. On arrival, documentaion included a CT abdomen with cholelithiasis no cholecystitis. He also had lactic acidosis, transaminitis and an elevated t bili. He was also with atrial fibrillation with RVR and hypotensive started on pressors. Of note, he stopped taking Xarelto in March 2022 secondary to finances and has been taking aspirin. He   Past Medical History:  Atrial fibrillation HTN DM HLD Hx of gastric bypass   Significant Hospital Events:  - 5/19 admitted to [facility] ICU - 5/20 repeat US and CT abdomen showed acute calculous cholecystitis. IR placed percutaneous drain    Consults:  GI, signed off  General surgery IR  Procedures:  - 5/19 central venous catheter  - 5/29 percutaneous cholecystostomy tube placed    Significant Diagnostic Tests:  US abdomen and CT abdomen show acute calculous cholecystitis   Micro Data:   Gallbladder fluid 5/20 >> Gram variable rods, GPC's >>  Blood 5/20 >>   Antimicrobials:  Metronidazole 5/18> 5/21 Cefepime 5/19> 5/21 Levaquin 5/18 Meropenem 5/21 >>   Interim History / Subjective:  Antibiotics changed overnight after OSH blood culture revealed ESBL E. coli Remains on amiodarone Pressors weaned to off I/O+ 11.8 L total Renal labs still pending this morning  Objective    Blood pressure 95/63, pulse 91, temperature (!) 97.5 F (36.4 C), temperature source Axillary, resp. rate (!) 22, height '5\' 10"'$  (1.778 m), weight 111.5 kg, SpO2 91 %. CVP:  [7 mmHg] 7 mmHg      Intake/Output Summary (Last 24 hours) at 11/11/2020 0757 Last data filed at 11/11/2020 0600 Gross per 24 hour  Intake 4163.22 ml  Output 175 ml  Net 3988.22 ml   Filed Weights   11/08/20 1600 11/11/20 0500  Weight: 98 kg 111.5 kg    Examination: General: Elderly male, no acute distress HENT: Oropharynx clear, no secretions, no stridor Lungs: Clear bilaterally, decreased to both bases Cardiovascular: Irregularly irregular, no murmur Abdomen: Nondistended, obese, mild upper line tenderness, no rebound Extremities: No edema Neuro: He is awake, alert, less well oriented and a bit confused.  Has had trouble remembering what day it is.  A clinical change compared with 5/21 Skin: No rash  Resolved Hospital Problem list   none  Assessment & Plan:   Septic shock Acute calculous cholecystitis with E. coli bacteremia, ESBL positive Lactic acidosis  Transaminitis -Underwent percutaneous cholecystostomy 5/20 -Pressors weaned off -Cefepime/metronidazole changed to meropenem 5/21 given outside hospital blood culture results -Appreciate surgery and interventional radiology assistance -Following LFTs, improving   Afib w/ RVR -Secondary to sepsis -Plan to change amiodarone to po 5/22 -Continue heparin infusion for now -Continue telemetry  AKI -Likely in the setting of septic shock and use of contrast  -Has not plateaued or rebounded.  He is making some urine and hopefully the plateau will be reached soon.  No clear indication for hemodialysis although he does have  some decline in mental status that could be associated with uremia.  No respiratory discomfort or tachypnea associated with acidosis.  Plan to follow closely.  Will call nephrology if he looks like he is declining in any way and will  need temporizing hemodialysis.   HTN -Home antihypertensive regimen is on hold  DM -Slight scale insulin as ordered -CBG monitoring  HLD -Restart statin when we confirm he is taking good p.o.    Best practice (evaluated daily)  Diet: Clear diet Pain/Anxiety/Delirium protocol (if indicated): IV dilaudid VAP protocol (if indicated): n/a DVT prophylaxis: Heparin GI prophylaxis: PPI Glucose control: SSI Mobility: bedrest Disposition: ICU  Goals of Care:  Last date of multidisciplinary goals of care discussion: 5/22 Family and staff present: Yes Summary of discussion: continue current plan of care Follow up goals of care discussion due: n/a Code Status: Full code  Labs   CBC: Recent Labs  Lab 11/08/20 1426 11/09/20 0358 11/10/20 0213 11/11/20 0418  WBC 17.3* 22.5* 21.0* 18.6*  HGB 11.0* 10.1* 10.2* 9.5*  HCT 35.9* 32.8* 31.7* 29.4*  MCV 85.7 87.0 85.0 82.8  PLT 145* 100* 92* 71*    Basic Metabolic Panel: Recent Labs  Lab 11/08/20 1426 11/09/20 0358 11/10/20 0213  NA 137 132* 134*  K 4.5 4.8 4.4  CL 105 105 105  CO2 18* 15* 16*  GLUCOSE 136* 147* 109*  BUN 28* 35* 48*  CREATININE 1.99* 2.38* 3.38*  CALCIUM 8.4* 7.4* 7.5*   GFR: Estimated Creatinine Clearance: 26.5 mL/min (A) (by C-G formula based on SCr of 3.38 mg/dL (H)). Recent Labs  Lab 11/08/20 1426 11/08/20 2035 11/09/20 0105 11/09/20 0358 11/10/20 0213 11/10/20 2140 11/11/20 0418  WBC 17.3*  --   --  22.5* 21.0*  --  18.6*  LATICACIDVEN  --  4.8* 4.5*  --   --  0.8  --     Liver Function Tests: Recent Labs  Lab 11/08/20 1426 11/09/20 0358 11/10/20 0213  AST 392* 200* 125*  ALT 348* 263* 199*  ALKPHOS 137* 103 125  BILITOT 3.2* 3.5* 3.5*  PROT 6.5 5.6* 5.7*  ALBUMIN 3.2* 2.6* 2.6*   Recent Labs  Lab 11/08/20 1428 11/09/20 0358  LIPASE 79* 27   No results for input(s): AMMONIA in the last 168 hours.  ABG No results found for: PHART, PCO2ART, PO2ART, HCO3, TCO2,  ACIDBASEDEF, O2SAT   Coagulation Profile: Recent Labs  Lab 11/08/20 1426  INR 1.4*    Cardiac Enzymes: No results for input(s): CKTOTAL, CKMB, CKMBINDEX, TROPONINI in the last 168 hours.  HbA1C: Hgb A1c MFr Bld  Date/Time Value Ref Range Status  11/08/2020 02:26 PM 8.3 (H) 4.8 - 5.6 % Final    Comment:    (NOTE) Pre diabetes:          5.7%-6.4%  Diabetes:              >6.4%  Glycemic control for   <7.0% adults with diabetes     CBG: Recent Labs  Lab 11/10/20 1208 11/10/20 1525 11/10/20 1951 11/10/20 2325 11/11/20 0422  GLUCAP 109* 109* 104* 113* 124*   Independent CC time 32 minutes  Baltazar Apo, MD, PhD 11/11/2020, 8:01 AM [facility] [phone] or if no answer before 7:00PM call [phone] For any issues after 7:00PM please call eLink [phone]

## 2020-11-11 NOTE — Progress Notes (Signed)
[place] for heparin Indication: atrial fibrillation  Allergies  Allergen Reactions  . Penicillamine     Other reaction(s): lips swell  . Penicillins     Other reaction(s): Swelling  Legacy System: CCA Onset Date: <blank> Substance Legacy/Cerner: penicillins / penicillins (Legacy value) Category: Drug Severity Legacy/Cerner: <blank> / Unknown Reaction(s): swelling Comments: <blank>  Legacy System: CCA Onset Date: <blank> Substance Legacy/Cerner: ampicillin / ampicillin (Legacy value) Category: Drug Severity Legacy/Cerner: <blank> / Unknown Reaction(s): swelling Comments: <blank>  Legacy System: CCA Onset Date: <blank> Substance Legacy/Cerner: ampicillin / ampicillin (Legacy value) Category: Drug Severity Legacy/Cerner: <blank> / Unknown Reaction(s): swelling Comments: <blank>  Legacy System: CCA Onset Date: <blank> Substance Legacy/Cerner: penicillins / penicillins (Legacy value) Category: Drug Severity Legacy/Cerner: <blank> / Unknown Reaction(s): swelling Comments: <blank>     Patient Measurements: Height: '5\' 10"'$  (177.8 cm) Weight: 111.5 kg (245 lb 13 oz) IBW/kg (Calculated) : 73 Heparin Dosing Weight: 93.3 kg  Vital Signs: Temp: 98.3 F (36.8 C) (05/22 1100) Temp Source: Oral (05/22 1100) BP: 108/66 (05/22 1600) Pulse Rate: 97 (05/22 1600)  Labs: Recent Labs    11/09/20 0358 11/10/20 0213 11/10/20 1054 11/10/20 2140 11/11/20 0418 11/11/20 0610 11/11/20 0835 11/11/20 1600  HGB 10.1* 10.2*  --   --  9.5*  --   --   --   HCT 32.8* 31.7*  --   --  29.4*  --   --   --   PLT 100* 92*  --   --  71*  --   --   --   HEPARINUNFRC  --  0.14*   < > 0.20*  --  0.21*  --  0.22*  CREATININE 2.38* 3.38*  --   --   --   --  4.64*  --    < > = values in this interval not displayed.    Estimated Creatinine Clearance: 19.3 mL/min (A) (by C-G formula based on SCr of 4.64 mg/dL (H)).   Assessment: 3 yof presenting  with cholelithiasis - now found to be in Afib with RVR.  Underwent perc cholecystomy 5/20 - was on Xarelto PTA (but off since March d/t cost).   Heparin level remains subtherapeutic (0.22) on gtt at 2100 units/hr.   Goal of Therapy:  Heparin level 0.3-0.7 units/ml Monitor platelets by anticoagulation protocol: Yes   Plan:  Increase heparin to 2350 units/hr Heparin level in 8 hours Follow platelets closely  Hildred Laser, PharmD Clinical Pharmacist **Pharmacist phone directory can now be found on [email or web address] ([email or web address]).  Listed under [place].

## 2020-11-11 NOTE — Progress Notes (Signed)
[place] for heparin Indication: atrial fibrillation  Allergies  Allergen Reactions  . Penicillamine     Other reaction(s): lips swell  . Penicillins     Other reaction(s): Swelling  Legacy System: CCA Onset Date: <blank> Substance Legacy/Cerner: penicillins / penicillins (Legacy value) Category: Drug Severity Legacy/Cerner: <blank> / Unknown Reaction(s): swelling Comments: <blank>  Legacy System: CCA Onset Date: <blank> Substance Legacy/Cerner: ampicillin / ampicillin (Legacy value) Category: Drug Severity Legacy/Cerner: <blank> / Unknown Reaction(s): swelling Comments: <blank>  Legacy System: CCA Onset Date: <blank> Substance Legacy/Cerner: ampicillin / ampicillin (Legacy value) Category: Drug Severity Legacy/Cerner: <blank> / Unknown Reaction(s): swelling Comments: <blank>  Legacy System: CCA Onset Date: <blank> Substance Legacy/Cerner: penicillins / penicillins (Legacy value) Category: Drug Severity Legacy/Cerner: <blank> / Unknown Reaction(s): swelling Comments: <blank>     Patient Measurements: Height: '5\' 10"'$  (177.8 cm) Weight: 111.5 kg (245 lb 13 oz) IBW/kg (Calculated) : 73 Heparin Dosing Weight: 93.3 kg  Vital Signs: Temp: 97.5 F (36.4 C) (05/22 0422) Temp Source: Axillary (05/22 0422) BP: 98/67 (05/22 0600) Pulse Rate: 94 (05/22 0600)  Labs: Recent Labs    11/08/20 1426 11/09/20 0358 11/09/20 0358 11/10/20 0213 11/10/20 1054 11/10/20 2140 11/11/20 0418 11/11/20 0610  HGB 11.0* 10.1*  --  10.2*  --   --  9.5*  --   HCT 35.9* 32.8*  --  31.7*  --   --  29.4*  --   PLT 145* 100*  --  92*  --   --  71*  --   LABPROT 17.3*  --   --   --   --   --   --   --   INR 1.4*  --   --   --   --   --   --   --   HEPARINUNFRC  --   --    < > 0.14* 0.11* 0.20*  --  0.21*  CREATININE 1.99* 2.38*  --  3.38*  --   --   --   --    < > = values in this interval not displayed.    Estimated Creatinine Clearance:  26.5 mL/min (A) (by C-G formula based on SCr of 3.38 mg/dL (H)).   Assessment: 58 yof presenting with cholelithiasis - now found to be in Afib with RVR.  Underwent perc cholecystomy 5/20 - was on Xarelto PTA (but off since March d/t cost).   Heparin level remains subtherapeutic (0.21) on gtt at 1900 units/hr. No issues with line or bleeding reported per RN. Hgb down to 9.5, plt down to 71.  Goal of Therapy:  Heparin level 0.3-0.7 units/ml Monitor platelets by anticoagulation protocol: Yes   Plan:  Increase heparin to 2100 units/hr Heparin level in 8 hours Follow platelets closely  Sherlon Handing, PharmD, BCPS Please see amion for complete clinical pharmacist phone list 11/11/2020 6:52 AM

## 2020-11-11 NOTE — Progress Notes (Signed)
Subjective: Perc chole placed 11/09/20. WBC slowly coming down. Off neo. Passing flatus. Abdominal pain continues to slightly improve. Cr going up.   Objective: Vital signs in last 24 hours: Temp:  [97.5 F (36.4 C)-98.4 F (36.9 C)] 98.3 F (36.8 C) (05/22 1100) Pulse Rate:  [90-193] 97 (05/22 1215) Resp:  [21-31] 25 (05/22 1215) BP: (83-127)/(56-100) 111/70 (05/22 1200) SpO2:  [90 %-100 %] 96 % (05/22 1215) Weight:  [111.5 kg] 111.5 kg (05/22 0500) Last BM Date: 11/10/20  Intake/Output from previous day: 05/21 0701 - 05/22 0700 In: 4163.2 [I.V.:3352.8; IV Piggyback:810.4] Out: 175 [Urine:100; Drains:75] Intake/Output this shift: Total I/O In: 648.3 [I.V.:561.2; IV Piggyback:87] Out: -   PE: General: resting comfortably, NAD Neuro: sleepy, arousable, answers questions quietly Resp: normal work of breathing CV: irregular rhythm Abdomen: soft, nondistended, mildly tender in upper abdomen. RUQ perc chole with faint bile tinged serosang fluid.   Lab Results:  Recent Labs    11/10/20 0213 11/11/20 0418  WBC 21.0* 18.6*  HGB 10.2* 9.5*  HCT 31.7* 29.4*  PLT 92* 71*   BMET Recent Labs    11/10/20 0213 11/11/20 0835  NA 134* 135  K 4.4 4.2  CL 105 110  CO2 16* 13*  GLUCOSE 109* 123*  BUN 48* 61*  CREATININE 3.38* 4.64*  CALCIUM 7.5* 7.6*   PT/INR Recent Labs    11/08/20 1426  LABPROT 17.3*  INR 1.4*   CMP     Component Value Date/Time   NA 135 11/11/2020 0835   K 4.2 11/11/2020 0835   CL 110 11/11/2020 0835   CO2 13 (L) 11/11/2020 0835   GLUCOSE 123 (H) 11/11/2020 0835   BUN 61 (H) 11/11/2020 0835   CREATININE 4.64 (H) 11/11/2020 0835   CALCIUM 7.6 (L) 11/11/2020 0835   PROT 5.1 (L) 11/11/2020 0835   ALBUMIN 2.1 (L) 11/11/2020 0835   AST 64 (H) 11/11/2020 0835   ALT 117 (H) 11/11/2020 0835   ALKPHOS 173 (H) 11/11/2020 0835   BILITOT 3.8 (H) 11/11/2020 0835   GFRNONAA 13 (L) 11/11/2020 0835   Lipase     Component Value Date/Time    LIPASE 27 11/09/2020 0358       Studies/Results: No results found.  Anti-infectives: Anti-infectives (From admission, onward)   Start     Dose/Rate Route Frequency Ordered Stop   11/10/20 2300  meropenem (MERREM) 1 g in sodium chloride 0.9 % 100 mL IVPB        1 g 200 mL/hr over 30 Minutes Intravenous Every 12 hours 11/10/20 2207     11/08/20 1600  ceFEPIme (MAXIPIME) 2 g in sodium chloride 0.9 % 100 mL IVPB  Status:  Discontinued        2 g 200 mL/hr over 30 Minutes Intravenous Every 12 hours 11/08/20 1456 11/10/20 2207   11/08/20 1500  metroNIDAZOLE (FLAGYL) IVPB 500 mg  Status:  Discontinued        500 mg 100 mL/hr over 60 Minutes Intravenous Every 8 hours 11/08/20 1404 11/10/20 2213       Assessment/Plan 65 yo male with multiple medical comorbidities with acute calculous cholecystitis. S/p percutaneous cholecystostomy. - Abdomen soft, minimal NG output and passing flatus. Remain on clear liquids for now given mental status - Drain cultures pending, continue broad-spectrum antibiotics, had blood cultures positive for ESBL GNR from OSH.  - Bilirubin stable. Likely secondary to cholecystitis and not from ongoing biliary obstruction. Continue to trend. If this rises, patient  will need an MRCP. - Surgery will continue to follow   LOS: 3 days   Milus Height, MD [facility] Surgical Oncology, General Surgery, Trauma and [place] [facility], [place] for weekday/non holidays Check [email or web address] for coverage night/weekend/holidays  Do not use SecureChat as it is not reliable for timely patient care.      11/11/20 1:20 PM

## 2020-11-12 ENCOUNTER — Inpatient Hospital Stay (HOSPITAL_COMMUNITY): Payer: Medicare (Managed Care)

## 2020-11-12 DIAGNOSIS — Z452 Encounter for adjustment and management of vascular access device: Secondary | ICD-10-CM

## 2020-11-12 DIAGNOSIS — R7989 Other specified abnormal findings of blood chemistry: Secondary | ICD-10-CM | POA: Diagnosis not present

## 2020-11-12 DIAGNOSIS — N179 Acute kidney failure, unspecified: Secondary | ICD-10-CM | POA: Diagnosis not present

## 2020-11-12 DIAGNOSIS — K810 Acute cholecystitis: Secondary | ICD-10-CM | POA: Diagnosis not present

## 2020-11-12 DIAGNOSIS — M79661 Pain in right lower leg: Secondary | ICD-10-CM

## 2020-11-12 DIAGNOSIS — M7989 Other specified soft tissue disorders: Secondary | ICD-10-CM

## 2020-11-12 DIAGNOSIS — R6521 Severe sepsis with septic shock: Secondary | ICD-10-CM | POA: Diagnosis not present

## 2020-11-12 DIAGNOSIS — A419 Sepsis, unspecified organism: Secondary | ICD-10-CM | POA: Diagnosis not present

## 2020-11-12 LAB — HEPATITIS B CORE ANTIBODY, TOTAL: Hep B Core Total Ab: NONREACTIVE

## 2020-11-12 LAB — BASIC METABOLIC PANEL
Anion gap: 13 (ref 5–15)
BUN: 69 mg/dL — ABNORMAL HIGH (ref 8–23)
CO2: 13 mmol/L — ABNORMAL LOW (ref 22–32)
Calcium: 7.8 mg/dL — ABNORMAL LOW (ref 8.9–10.3)
Chloride: 107 mmol/L (ref 98–111)
Creatinine, Ser: 5.5 mg/dL — ABNORMAL HIGH (ref 0.61–1.24)
GFR, Estimated: 11 mL/min — ABNORMAL LOW (ref >60)
Glucose, Bld: 112 mg/dL — ABNORMAL HIGH (ref 70–99)
Potassium: 3.9 mmol/L (ref 3.5–5.1)
Sodium: 133 mmol/L — ABNORMAL LOW (ref 135–145)

## 2020-11-12 LAB — CBC
HCT: 27.7 % — ABNORMAL LOW (ref 39.0–52.0)
Hemoglobin: 9.1 g/dL — ABNORMAL LOW (ref 13.0–17.0)
MCH: 26.7 pg (ref 26.0–34.0)
MCHC: 32.9 g/dL (ref 30.0–36.0)
MCV: 81.2 fL (ref 80.0–100.0)
Platelets: 71 10*3/uL — ABNORMAL LOW (ref 150–400)
RBC: 3.41 MIL/uL — ABNORMAL LOW (ref 4.22–5.81)
RDW: 17.7 % — ABNORMAL HIGH (ref 11.5–15.5)
WBC: 16.3 10*3/uL — ABNORMAL HIGH (ref 4.0–10.5)
nRBC: 0 % (ref 0.0–0.2)

## 2020-11-12 LAB — AEROBIC/ANAEROBIC CULTURE W GRAM STAIN (SURGICAL/DEEP WOUND)

## 2020-11-12 LAB — HEPATIC FUNCTION PANEL
ALT: 89 U/L — ABNORMAL HIGH (ref 0–44)
AST: 45 U/L — ABNORMAL HIGH (ref 15–41)
Albumin: 2 g/dL — ABNORMAL LOW (ref 3.5–5.0)
Alkaline Phosphatase: 170 U/L — ABNORMAL HIGH (ref 38–126)
Bilirubin, Direct: 1.1 mg/dL — ABNORMAL HIGH (ref 0.0–0.2)
Indirect Bilirubin: 1.8 mg/dL — ABNORMAL HIGH (ref 0.3–0.9)
Total Bilirubin: 2.9 mg/dL — ABNORMAL HIGH (ref 0.3–1.2)
Total Protein: 5.3 g/dL — ABNORMAL LOW (ref 6.5–8.1)

## 2020-11-12 LAB — HEPARIN LEVEL (UNFRACTIONATED)
Heparin Unfractionated: 0.27 IU/mL — ABNORMAL LOW (ref 0.30–0.70)
Heparin Unfractionated: 0.25 IU/mL — ABNORMAL LOW (ref 0.30–0.70)

## 2020-11-12 LAB — GLUCOSE, CAPILLARY
Glucose-Capillary: 107 mg/dL — ABNORMAL HIGH (ref 70–99)
Glucose-Capillary: 117 mg/dL — ABNORMAL HIGH (ref 70–99)
Glucose-Capillary: 113 mg/dL — ABNORMAL HIGH (ref 70–99)
Glucose-Capillary: 134 mg/dL — ABNORMAL HIGH (ref 70–99)
Glucose-Capillary: 141 mg/dL — ABNORMAL HIGH (ref 70–99)
Glucose-Capillary: 121 mg/dL — ABNORMAL HIGH (ref 70–99)

## 2020-11-12 LAB — HEPATITIS B SURFACE ANTIBODY,QUALITATIVE: Hep B S Ab: NONREACTIVE

## 2020-11-12 LAB — HEPATITIS B SURFACE ANTIGEN: Hepatitis B Surface Ag: NONREACTIVE

## 2020-11-12 MED ORDER — CHLORHEXIDINE GLUCONATE CLOTH 2 % EX PADS
6.0000 | MEDICATED_PAD | Freq: Every day | CUTANEOUS | Status: DC
  Administered 2020-11-12: 6 via TOPICAL

## 2020-11-12 MED ORDER — AMIODARONE HCL IN DEXTROSE 360-4.14 MG/200ML-% IV SOLN
30.0000 mg/h | INTRAVENOUS | Status: DC
  Administered 2020-11-12 – 2020-11-15 (×6): 30 mg/h via INTRAVENOUS
  Filled 2020-11-12 (×6): qty 200

## 2020-11-12 MED ORDER — SODIUM CHLORIDE 0.9 % IV SOLN
1.0000 g | INTRAVENOUS | Status: DC
  Administered 2020-11-12: 1 g via INTRAVENOUS
  Filled 2020-11-12 (×2): qty 1

## 2020-11-12 MED ORDER — HEPARIN SODIUM (PORCINE) 1000 UNIT/ML DIALYSIS: 1000.0000 [IU] | INTRAMUSCULAR | Status: DC | PRN

## 2020-11-12 MED ORDER — SODIUM CHLORIDE 0.9 % IV SOLN: 100.0000 mL | INTRAVENOUS | Status: DC | PRN

## 2020-11-12 MED ORDER — HEPARIN SODIUM (PORCINE) 1000 UNIT/ML DIALYSIS
1000.0000 [IU] | INTRAMUSCULAR | Status: DC | PRN
  Administered 2020-11-12: 2400 [IU]
  Administered 2020-11-15: 2000 [IU]
  Filled 2020-11-12 (×2): qty 3
  Filled 2020-11-12 (×5): qty 6

## 2020-11-12 MED ORDER — ALTEPLASE 2 MG IJ SOLR: 2.0000 mg | Freq: Once | INTRAMUSCULAR | Status: DC | PRN

## 2020-11-12 MED ORDER — LIDOCAINE HCL (PF) 1 % IJ SOLN: 5.0000 mL | INTRAMUSCULAR | Status: DC | PRN

## 2020-11-12 MED ORDER — LIDOCAINE-PRILOCAINE 2.5-2.5 % EX CREA
1.0000 "application " | TOPICAL_CREAM | CUTANEOUS | Status: DC | PRN
  Filled 2020-11-12: qty 5

## 2020-11-12 MED ORDER — PENTAFLUOROPROP-TETRAFLUOROETH EX AERO: 1.0000 "application " | INHALATION_SPRAY | CUTANEOUS | Status: DC | PRN

## 2020-11-12 NOTE — Progress Notes (Signed)
Right lower extremity venous duplex has been completed. Preliminary results can be found in CV Proc through chart review.   11/12/20 4:11 PM Tom Scott RVT

## 2020-11-12 NOTE — Progress Notes (Signed)
Subjective: Patient having horrible pain in his RLE that just started yesterday.  Still having some abdominal pain.  Starting HD today, doesn't talk much.  ROS: See above, otherwise other systems negative  Objective: Vital signs in last 24 hours: Temp:  [97.5 F (36.4 C)-98.3 F (36.8 C)] 97.5 F (36.4 C) (05/23 0700) Pulse Rate:  [95-119] 98 (05/23 0800) Resp:  [20-40] 20 (05/23 0800) BP: (93-117)/(61-84) 106/61 (05/23 0800) SpO2:  [87 %-99 %] 99 % (05/23 0800) Last BM Date: 11/10/20  Intake/Output from previous day: 05/22 0701 - 05/23 0700 In: 1582.2 [I.V.:1382.3; IV Piggyback:199.9] Out: 350 [Urine:150; Drains:200] Intake/Output this shift: Total I/O In: 39.2 [I.V.:39.2] Out: -   PE: Gen: moderate distress secondary to RLE pain Abd: soft, tender in RUQ, perc chole drain in place with bilious output, obese, ND, +BS Ext: can't even touch RLE secondary to pain, no erythema but some unilateral RLE edema is present  Lab Results:  Recent Labs    11/11/20 0418 11/11/20 1755 11/12/20 0334  WBC 18.6*  --  16.3*  HGB 9.5* 10.2* 9.1*  HCT 29.4* 30.0* 27.7*  PLT 71*  --  71*   BMET Recent Labs    11/11/20 1600 11/11/20 1755 11/12/20 0334  NA 133* 137 133*  K 4.0 3.9 3.9  CL 107  --  107  CO2 13*  --  13*  GLUCOSE 119*  --  112*  BUN 63*  --  69*  CREATININE 4.89*  --  5.50*  CALCIUM 7.7*  --  7.8*   PT/INR No results for input(s): LABPROT, INR in the last 72 hours. CMP     Component Value Date/Time   NA 133 (L) 11/12/2020 0334   K 3.9 11/12/2020 0334   CL 107 11/12/2020 0334   CO2 13 (L) 11/12/2020 0334   GLUCOSE 112 (H) 11/12/2020 0334   BUN 69 (H) 11/12/2020 0334   CREATININE 5.50 (H) 11/12/2020 0334   CALCIUM 7.8 (L) 11/12/2020 0334   PROT 5.1 (L) 11/11/2020 0835   ALBUMIN 2.1 (L) 11/11/2020 0835   AST 64 (H) 11/11/2020 0835   ALT 117 (H) 11/11/2020 0835   ALKPHOS 173 (H) 11/11/2020 0835   BILITOT 3.8 (H) 11/11/2020 0835   GFRNONAA 11  (L) 11/12/2020 0334   Lipase     Component Value Date/Time   LIPASE 27 11/09/2020 0358       Studies/Results: No results found.  Anti-infectives: Anti-infectives (From admission, onward)   Start     Dose/Rate Route Frequency Ordered Stop   11/12/20 2200  meropenem (MERREM) 1 g in sodium chloride 0.9 % 100 mL IVPB        1 g 200 mL/hr over 30 Minutes Intravenous Every 24 hours 11/12/20 0742     11/10/20 2300  meropenem (MERREM) 1 g in sodium chloride 0.9 % 100 mL IVPB  Status:  Discontinued        1 g 200 mL/hr over 30 Minutes Intravenous Every 12 hours 11/10/20 2207 11/12/20 0742   11/08/20 1600  ceFEPIme (MAXIPIME) 2 g in sodium chloride 0.9 % 100 mL IVPB  Status:  Discontinued        2 g 200 mL/hr over 30 Minutes Intravenous Every 12 hours 11/08/20 1456 11/10/20 2207   11/08/20 1500  metroNIDAZOLE (FLAGYL) IVPB 500 mg  Status:  Discontinued        500 mg 100 mL/hr over 60 Minutes Intravenous Every 8 hours 11/08/20 1404 11/10/20 2213  Assessment/Plan E coli bacteremia ARF - starting HD today DM A fib HTN RLE pain - primary present with me, duplex today to rule out DVT, but could be gout of his knee as well.  Defer to primary service.  Acute cholecystitis -s/p perc chole drain by IR -LFTs still uptrending yesterday and not rechecked yet today.  If LFTs remotely stable or up, GI needs to evaluate the patient for retained CBD stone. -perc drain will need to remain in place 6-8 weeks at least.  FEN - CLD VTE - heparin ID - merrem   LOS: 4 days    Henreitta Cea , [facility] 11/12/2020, 9:55 AM Please see Amion for pager number during day hours 7:00am-4:30pm or 7:00am -11:30am on weekends

## 2020-11-12 NOTE — Progress Notes (Signed)
[place] [facility] Progress Note  Chief Complaint: Elevated LFTs  History:  We were asked by the critical care and surgical service to reevaluate this patient for elevated liver labs. He was seen by Dr. Tarri Glenn several days ago when admitted with acute calculus cholecystitis with sepsis and acute renal failure with gram-negative bacteremia.  At that time, impression was patient most likely did not have choledocholithiasis, no MRCP or ERCP was planned at that point.  This patient then underwent percutaneous cholecystostomy on May 20.  He has been on broad-spectrum antibiotics with a slowly improving WBC.  Unfortunately his renal function worsened to the point that he is to start dialysis today.  This patient is lethargic and cannot give any helpful history or review of systems.  His wife and daughter are at the bedside and say that he was complaining of severe right knee pain earlier today.  Other than that he has been somnolent and eating little or nothing from the clear liquid diet over the last couple of days.  ROS: Unable to obtain due to patient's mental status  Chart notes reviewed.  Objective:   Current Facility-Administered Medications:  .  0.9 %  sodium chloride infusion, 250 mL, Intravenous, Continuous, Bowser, Shirlee Limerick E, NP .  acetaminophen (TYLENOL) tablet 650 mg, 650 mg, Oral, Q6H PRN **OR** acetaminophen (TYLENOL) suppository 650 mg, 650 mg, Rectal, Q4H PRN, Halford Chessman, Vineet, MD .  amiodarone (NEXTERONE PREMIX) 360-4.14 MG/200ML-% (1.8 mg/mL) IV infusion, 30 mg/hr, Intravenous, Continuous, Rehman, Areeg N, DO, Last Rate: 16.67 mL/hr at 11/12/20 1500, 30 mg/hr at 11/12/20 1500 .  chlorhexidine (PERIDEX) 0.12 % solution 15 mL, 15 mL, Mouth Rinse, BID, Halford Chessman, Vineet, MD, 15 mL at 11/11/20 2218 .  Chlorhexidine Gluconate Cloth 2 % PADS 6 each, 6 each, Topical, Daily, Chesley Mires, MD, 6 each at 11/11/20 1005 .  [START ON 11/13/2020] Chlorhexidine Gluconate Cloth 2 % PADS 6 each, 6 each, Topical,  Q0600, Claudia Desanctis, MD .  docusate sodium (COLACE) capsule 100 mg, 100 mg, Oral, BID PRN, Bowser, Grace E, NP .  heparin ADULT infusion 100 units/mL (25000 units/213m), 2,550 Units/hr, Intravenous, Continuous, AFranky Macho RPH, Last Rate: 25.5 mL/hr at 11/12/20 1500, 2,550 Units/hr at 11/12/20 1500 .  HYDROmorphone (DILAUDID) injection 0.5 mg, 0.5 mg, Intravenous, Q3H PRN, Rehman, Areeg N, DO, 0.5 mg at 11/12/20 1131 .  insulin aspart (novoLOG) injection 0-15 Units, 0-15 Units, Subcutaneous, Q4H, Bowser, GLaurel Dimmer NP, 2 Units at 11/12/20 0011 .  MEDLINE mouth rinse, 15 mL, Mouth Rinse, q12n4p, Sood, Vineet, MD, 15 mL at 11/12/20 1100 .  meropenem (MERREM) 1 g in sodium chloride 0.9 % 100 mL IVPB, 1 g, Intravenous, Q24H, MRolla Flatten RPH .  ondansetron ([facility] injection 4 mg, 4 mg, Intravenous, Q6H PRN, Bowser, GLaurel Dimmer NP, 4 mg at 11/09/20 0840 .  pantoprazole (PROTONIX) injection 40 mg, 40 mg, Intravenous, Q24H, GVena Rua PA-C, 40 mg at 11/11/20 2102 .  polyethylene glycol (MIRALAX / GLYCOLAX) packet 17 g, 17 g, Oral, Daily PRN, Bowser, Grace E, NP .  sodium bicarbonate 150 mEq in dextrose 5 % 1,150 mL infusion, , Intravenous, Continuous, Byrum, RRose Fillers MD, Last Rate: 30 mL/hr at 11/12/20 1500, Infusion Verify at 11/12/20 1500 .  sodium chloride flush (NS) 0.9 % injection 10-40 mL, 10-40 mL, Intracatheter, Q12H, Agarwala, Ravi, MD, 10 mL at 11/12/20 0815 .  sodium chloride flush (NS) 0.9 % injection 10-40 mL, 10-40 mL, Intracatheter, PRN, AKipp Brood MD .  sodium  chloride flush (NS) 0.9 % injection 5 mL, 5 mL, Intracatheter, Q8H, Greggory Keen, MD, 5 mL at 11/12/20 1353  . sodium chloride    . amiodarone 30 mg/hr (11/12/20 1500)  . heparin 2,550 Units/hr (11/12/20 1500)  . meropenem (MERREM) IV    . sodium bicarbonate 150 mEq in D5W infusion 30 mL/hr at 11/12/20 1500     Vital signs in last 24 hrs: Vitals:   11/12/20 1300 11/12/20 1400  BP: 106/61 107/65   Pulse: (!) 108 (!) 108  Resp: (!) 23 19  Temp:    SpO2: 98% 99%    Intake/Output Summary (Last 24 hours) at 11/12/2020 1523 Last data filed at 11/12/2020 1500 Gross per 24 hour  Intake 1101.52 ml  Output 382 ml  Net 719.52 ml     Physical Exam Acutely ill-appearing man, stuporous Right neck tunneled dialysis catheter  HEENT: sclera mildly icteric, oral mucosa without lesions  Neck: supple, no thyromegaly, JVD or lymphadenopathy  Cardiac: Heart sounds distant, irregular, + peripheral edema  Pulm: clear to auscultation bilaterally, normal RR and effort noted  Abdomen: soft, upper tenderness, with active bowel sounds. No guarding or palpable hepatosplenomegaly  Skin; warm and dry, no jaundice, pale Bilious drainage from cholecystostomy tube (approximately 200 cc today) Recent Labs:  CBC Latest Ref Rng & Units 11/12/2020 11/11/2020 11/11/2020  WBC 4.0 - 10.5 K/uL 16.3(H) - 18.6(H)  Hemoglobin 13.0 - 17.0 g/dL 9.1(L) 10.2(L) 9.5(L)  Hematocrit 39.0 - 52.0 % 27.7(L) 30.0(L) 29.4(L)  Platelets 150 - 400 K/uL 71(L) - 71(L)    Recent Labs  Lab 11/08/20 1426  INR 1.4*   CMP Latest Ref Rng & Units 11/12/2020 11/11/2020 11/11/2020  Glucose 70 - 99 mg/dL 112(H) - 119(H)  BUN 8 - 23 mg/dL 69(H) - 63(H)  Creatinine 0.61 - 1.24 mg/dL 5.50(H) - 4.89(H)  Sodium 135 - 145 mmol/L 133(L) 137 133(L)  Potassium 3.5 - 5.1 mmol/L 3.9 3.9 4.0  Chloride 98 - 111 mmol/L 107 - 107  CO2 22 - 32 mmol/L 13(L) - 13(L)  Calcium 8.9 - 10.3 mg/dL 7.8(L) - 7.7(L)  Total Protein 6.5 - 8.1 g/dL 5.3(L) - -  Total Bilirubin 0.3 - 1.2 mg/dL 2.9(H) - -  Alkaline Phos 38 - 126 U/L 170(H) - -  AST 15 - 41 U/L 45(H) - -  ALT 0 - 44 U/L 89(H) - -     Radiologic studies: INDICATION: Acute calculus cholecystitis   EXAM: ULTRASOUND FLUOROSCOPIC PERCUTANEOUS TRANSHEPATIC CHOLECYSTOSTOMY   MEDICATIONS: Patient is already receiving IV antibiotics as an inpatient; The antibiotic was administered within an  appropriate time frame prior to the initiation of the procedure.   ANESTHESIA/SEDATION: Moderate (conscious) sedation was employed during this procedure. A total of Versed 1.0 mg and Fentanyl 50 mcg was administered intravenously.   Moderate Sedation Time: 10 minutes. The patient's level of consciousness and vital signs were monitored continuously by radiology nursing throughout the procedure under my direct supervision.   FLUOROSCOPY TIME:  Fluoroscopy Time: 0 minutes 30 seconds (6 mGy).   COMPLICATIONS: None immediate.   PROCEDURE: Informed written consent was obtained from the patient after a thorough discussion of the procedural risks, benefits and alternatives. All questions were addressed. Maximal Sterile Barrier Technique was utilized including caps, mask, sterile gowns, sterile gloves, sterile drape, hand hygiene and skin antiseptic. A timeout was performed prior to the initiation of the procedure.   Previous imaging reviewed. Preliminary ultrasound performed in the right upper quadrant. The thick-walled edematous gallbladder  was localized and marked for a transhepatic approach below the right subcostal margin.   Under sterile conditions and local anesthesia, a 21 gauge needle was advanced percutaneously via transhepatic approach into the gallbladder. Needle position confirmed with ultrasound. Images obtained for documentation. Bile sample obtained and sent for culture. Guidewire inserted followed by Accustick dilator set. Amplatz guidewire inserted followed by tract dilatation insert a 10 French drain. Drain catheter position confirmed with ultrasound and fluoroscopy. Images obtained for documentation. Catheter secured with a Prolene suture and connected to external gravity drainage bag. Sterile dressing applied. No immediate complication. Patient tolerated the procedure well.   IMPRESSION: Successful ultrasound and fluoroscopic 10 French  percutaneous transhepatic cholecystostomy.     Electronically Signed   By: Jerilynn Mages.  Shick M.D.   On: 11/09/2020 13:17      Assessment & Plan  Assessment: Acute calculus cholecystitis with sepsis and bacteremia Elevated LFTs. Acute renal failure, to begin hemodialysis Atrial fibrillation  I believe his persistently elevated LFTs are from the cholecystitis, and that he probably does not have choledocholithiasis. Given his current condition, MRCP done at this point would be a poor quality study.  I recommend monitoring his LFTs daily and we will follow, most likely return to see him day after tomorrow.  Call sooner if needed.  Family updated.   26 minutes were spent on this encounter (including chart review, history/exam, counseling/coordination of care, and documentation) > 50% of that time was spent on counseling and coordination of care.  Extensive chart review required  Nelida Meuse III Office: [phone]

## 2020-11-12 NOTE — Progress Notes (Signed)
[place] for heparin Indication: atrial fibrillation  Allergies  Allergen Reactions  . Penicillamine     Other reaction(s): lips swell  . Penicillins     Other reaction(s): Swelling  Legacy System: CCA Onset Date: <blank> Substance Legacy/Cerner: penicillins / penicillins (Legacy value) Category: Drug Severity Legacy/Cerner: <blank> / Unknown Reaction(s): swelling Comments: <blank>  Legacy System: CCA Onset Date: <blank> Substance Legacy/Cerner: ampicillin / ampicillin (Legacy value) Category: Drug Severity Legacy/Cerner: <blank> / Unknown Reaction(s): swelling Comments: <blank>  Legacy System: CCA Onset Date: <blank> Substance Legacy/Cerner: ampicillin / ampicillin (Legacy value) Category: Drug Severity Legacy/Cerner: <blank> / Unknown Reaction(s): swelling Comments: <blank>  Legacy System: CCA Onset Date: <blank> Substance Legacy/Cerner: penicillins / penicillins (Legacy value) Category: Drug Severity Legacy/Cerner: <blank> / Unknown Reaction(s): swelling Comments: <blank>     Patient Measurements: Height: '5\' 10"'$  (177.8 cm) Weight: 111.5 kg (245 lb 13 oz) IBW/kg (Calculated) : 73 Heparin Dosing Weight: 93.3 kg  Vital Signs: Temp: 98.2 F (36.8 C) (05/23 0012) Temp Source: Oral (05/23 0012) BP: 102/68 (05/23 0100) Pulse Rate: 110 (05/23 0100)  Labs: Recent Labs    11/09/20 0358 11/10/20 0213 11/10/20 1054 11/11/20 0418 11/11/20 0610 11/11/20 0835 11/11/20 1600 11/11/20 1755 11/12/20 0141  HGB 10.1* 10.2*  --  9.5*  --   --   --  10.2*  --   HCT 32.8* 31.7*  --  29.4*  --   --   --  30.0*  --   PLT 100* 92*  --  71*  --   --   --   --   --   HEPARINUNFRC  --  0.14*   < >  --  0.21*  --  0.22*  --  0.27*  CREATININE 2.38* 3.38*  --   --   --  4.64* 4.89*  --   --    < > = values in this interval not displayed.    Estimated Creatinine Clearance: 18.3 mL/min (A) (by C-G formula based on SCr of 4.89  mg/dL (H)).   Assessment: 3 yof presenting with cholelithiasis - now found to be in Afib with RVR.  Underwent perc cholecystomy 5/20 - was on Xarelto PTA (but off since March d/t cost).   Heparin level remains slightly subtherapeutic (0.27) on gtt at 2350 units/hr.   Goal of Therapy:  Heparin level 0.3-0.7 units/ml Monitor platelets by anticoagulation protocol: Yes   Plan:  Increase heparin to 2550 units/hr Heparin level in 8 hours Follow platelets closely  Sherlon Handing, PharmD, BCPS Please see amion for complete clinical pharmacist phone list 11/12/2020 2:19 AM

## 2020-11-12 NOTE — Progress Notes (Signed)
[place] for heparin Indication: atrial fibrillation  Patient Measurements: Height: '5\' 10"'$  (177.8 cm) Weight: 111.5 kg (245 lb 13 oz) IBW/kg (Calculated) : 73 Heparin Dosing Weight: 93.3 kg  Vital Signs: Temp: 98 F (36.7 C) (05/23 0341) Temp Source: Axillary (05/23 0341) BP: 102/68 (05/23 0100) Pulse Rate: 110 (05/23 0100)  Labs: Recent Labs    11/10/20 0213 11/10/20 1054 11/11/20 0418 11/11/20 0610 11/11/20 0835 11/11/20 1600 11/11/20 1755 11/12/20 0141 11/12/20 0334  HGB 10.2*  --  9.5*  --   --   --  10.2*  --  9.1*  HCT 31.7*  --  29.4*  --   --   --  30.0*  --  27.7*  PLT 92*  --  71*  --   --   --   --   --  71*  HEPARINUNFRC 0.14*   < >  --  0.21*  --  0.22*  --  0.27*  --   CREATININE 3.38*  --   --   --  4.64* 4.89*  --   --  5.50*   < > = values in this interval not displayed.    Estimated Creatinine Clearance: 16.3 mL/min (A) (by C-G formula based on SCr of 5.5 mg/dL (H)).   Assessment: 45 yof presenting with cholelithiasis - now found to be in Afib with RVR.  Underwent perc cholecystomy 5/20 - was on Xarelto PTA (but off since March d/t cost).   Heparin was held this morning for CVC placement in anticipation of need for HD vs CRRT. Plan is to resume heparin ~30 minutes after the line placement, will restart at the prior rate and plan to check the level in 8 hours after restart.   Goal of Therapy:  Heparin level 0.3-0.7 units/ml Monitor platelets by anticoagulation protocol: Yes   Plan:  - Restart Heparin at 2550 units/hr (25.5 ml/hr) - Will plan to monitor platelets closely - Will continue to monitor for any signs/symptoms of bleeding and will follow up with heparin level in 8 hours after restarting  Thank you for allowing pharmacy to be a part of this patient's care.  Alycia Rossetti, PharmD, BCPS Clinical Pharmacist Clinical phone for 11/12/2020: [phone] 11/12/2020 12:32 PM   **Pharmacist phone directory  can now be found on [email or web address] ([email or web address]).  Listed under [place].

## 2020-11-12 NOTE — Procedures (Signed)
  Central Venous Catheter Insertion Procedure Note Divine Nagarajan [id number] [birth year]  Procedure: Insertion of Non-tunneled HD Catheter with US guidance Indications: HD  Procedure Details Consent: Risks of procedure as well as the alternatives and risks of each were explained to the (patient/caregiver).  Consent for procedure obtained. Time Out: Verified patient identification, verified procedure, site/side was marked, verified correct patient position, special equipment/implants available, medications/allergies/relevent history reviewed, required imaging and test results available.  Performed  Maximum sterile technique was used including antiseptics, cap, gloves, gown, hand hygiene, mask and sheet. Skin prep: Chlorhexidine; local anesthetic administered A antimicrobial bonded/coated triple lumen catheter was placed in the left internal jugular vein using the Seldinger technique. Catheter placed to 15 cm. Blood aspirated via all 3 ports and then flushed x 3. Line sutured x 2 and dressing applied.  Ultrasound guidance used.Yes.    Evaluation Blood flow good Complications: No apparent complications Patient did tolerate procedure well. Chest X-ray ordered to verify placement.  CXR: pending.   Harlow Ohms, DO PGY-2 IMTS

## 2020-11-12 NOTE — Progress Notes (Signed)
eLink Physician-Brief Progress Note Patient Name: Tom Scott DOB: 1956/06/20 MRN: [id number]   Date of Service  11/12/2020  HPI/Events of Note  Patient c/o R knee and LE pain. No evidence of deep vein thrombosis in the R LE,  eICU Interventions  Will send a Uric Acid level with AM labs.      Intervention Category Major Interventions: Other:  Lysle Dingwall 11/12/2020, 11:04 PM

## 2020-11-12 NOTE — Progress Notes (Signed)
ANTICOAGULATION CONSULT NOTE   Pharmacy Consult for heparin Indication: atrial fibrillation  Patient Measurements: Height: '5\' 10"'$  (177.8 cm) Weight: 111.5 kg (245 lb 13 oz) IBW/kg (Calculated) : 73 Heparin Dosing Weight: 93.3 kg  Vital Signs: Temp: 98.3 F (36.8 C) (05/23 2308) Temp Source: Axillary (05/23 2308) BP: 98/64 (05/23 1700) Pulse Rate: 96 (05/23 1700)  Labs: Recent Labs    11/10/20 0213 11/10/20 1054 11/11/20 0418 11/11/20 0610 11/11/20 0835 11/11/20 1600 11/11/20 1755 11/12/20 0141 11/12/20 0334 11/12/20 2200  HGB 10.2*  --  9.5*  --   --   --  10.2*  --  9.1*  --   HCT 31.7*  --  29.4*  --   --   --  30.0*  --  27.7*  --   PLT 92*  --  71*  --   --   --   --   --  71*  --   HEPARINUNFRC 0.14*   < >  --    < >  --  0.22*  --  0.27*  --  0.25*  CREATININE 3.38*  --   --   --  4.64* 4.89*  --   --  5.50*  --    < > = values in this interval not displayed.    Estimated Creatinine Clearance: 16.3 mL/min (A) (by C-G formula based on SCr of 5.5 mg/dL (H)).   Assessment: 45 yof presenting with cholelithiasis - now found to be in Afib with RVR.  Underwent perc cholecystomy 5/20 - was on Xarelto PTA (but off since March d/t cost).   Heparin was held 5/23 a.m. for CVC placement in anticipation of need for HD vs CRRT. Restarted post placement.  Heparin level subtherapeutic (0.25) on gtt at 2550 units/hr. No issues with line or bleeding reported per RN.  Goal of Therapy:  Heparin level 0.3-0.7 units/ml Monitor platelets by anticoagulation protocol: Yes   Plan:  Increase heparin to 2700 units/hr Will f/u 8hr heparin level  Sherlon Handing, PharmD, BCPS Please see amion for complete clinical pharmacist phone list 11/12/2020 11:15 PM

## 2020-11-12 NOTE — Progress Notes (Signed)
NAME:  Tom Scott, MRN:  [id number], DOB:  03-Sep-1955, LOS: 4 ADMISSION DATE:  11/08/2020, CONSULTATION DATE: 11/08/2020  CHIEF COMPLAINT:  Septic shock   History of Present Illness:   31 yoM with a history of atrial fibrillation on xarelto (not taking since 3/22), hypertension, DM, and HLD who initially presented to [facility] in [place] for chest and epigastric pain. Transferred to [facility] due to septic shock requiring pressors and concerns for choledocholithiasis.   He reports 2 weeks of intermittent abdominal pain that worsened 3 days ago. Endorses nausea, denies emesis, fevers, diarrhea. Sign out prior to transfer expressed concern for choledocholithiasis. On arrival, documentaion included a CT abdomen with cholelithiasis no cholecystitis. He also had lactic acidosis, transaminitis and an elevated t bili. He was also with atrial fibrillation with RVR and hypotensive started on pressors. Of note, he stopped taking Xarelto in March 2022 secondary to finances and has been taking aspirin. He   Past Medical History:  Atrial fibrillation HTN DM HLD Hx of gastric bypass   Significant Hospital Events:  - 5/19 admitted to [facility] ICU - 5/20 repeat US and CT abdomen showed acute calculous cholecystitis. IR placed percutaneous drain    Consults:  GI General surgery IR  Procedures:  - 5/19 central venous catheter  - 5/29 percutaneous cholecystostomy tube placed    Significant Diagnostic Tests:  US abdomen and CT abdomen show acute calculous cholecystitis  RLE vascular US pending  Micro Data:   Gallbladder fluid 5/20 >> Gram variable rods, GPC's >>  Blood 5/20 >>   Antimicrobials:  Metronidazole 5/18> 5/21 Cefepime 5/19> 5/21 Levaquin 5/18 Meropenem 5/21 >>   Interim History / Subjective:   Overnight, patient's mentation seems to have worsened. He is complaining of right lower extremity pain. Continues to have upper quadrant abdominal pain. Not able to answer all my  questions, following some simple commands.   Switched to oral amiodarone Pressors remain off I/O+ 13.1 L total Cr 4.89  Objective   Blood pressure 102/68, pulse (!) 110, temperature (!) 97.5 F (36.4 C), temperature source Axillary, resp. rate (!) 40, height '5\' 10"'$  (1.778 m), weight 111.5 kg, SpO2 (!) 89 %. CVP:  [6 mmHg] 6 mmHg      Intake/Output Summary (Last 24 hours) at 11/12/2020 0750 Last data filed at 11/12/2020 0550 Gross per 24 hour  Intake 1471.14 ml  Output 350 ml  Net 1121.14 ml   Filed Weights   11/08/20 1600 11/11/20 0500  Weight: 98 kg 111.5 kg    Examination: General: Elderly male, appears acutely ill, uncomfortable and agitated  HENT: Oropharynx clear, no secretions, no stridor Lungs: Clear bilaterally, decreased to both bases Cardiovascular: Irregularly irregular, no murmur Abdomen: Nondistended, obese, upper quadrant tenderness, no rebound Extremities: RLE swelling, extremely tender to palpation Neuro: He is awake, alert, less well oriented and more confused, worsening since 5/22 Skin: No rash, warm  Resolved Hospital Problem list   none  Assessment & Plan:   Septic shock Acute calculous cholecystitis with E. coli bacteremia, ESBL positive Lactic acidosis  Transaminitis -Underwent percutaneous cholecystostomy 5/20 -Remains off pressors -Cefepime/metronidazole changed to meropenem 5/21 given outside hospital blood culture results -Appreciate surgery and interventional radiology assistance -Tbili at 3.8 from 3.5 and he is continuing to have significant upper quadrant tenderness - Will re-consult GI - Will likely need an MRCP  Afib w/ RVR -Secondary to sepsis -Amiodarone switched to PO 5/22 -Holding heparin this morning for HD access, to resume after line placement -Continue  telemetry  AKI -Likely in the setting of septic shock and use of contrast  - Cr continues to uptrend - He is still making some urine  - Will consult nephrology today  with plans for HD access and HD to follow - Uremia can be contributing to change in mentation  Right leg swelling - Patient endorses significant RLE pain and there is notable swelling compared to left, no pitting edema - Unlikely clot, but will order RLE vascular US - Does have a history of gout, can consider colchicine +/- steroids as plans for HD today; will discuss with nephro    HTN -Home antihypertensive regimen is on hold  DM -Slight scale insulin as ordered -CBG monitoring  HLD -Restart statin when we confirm he is taking good p.o.    Best practice (evaluated daily)  Diet: Clear diet Pain/Anxiety/Delirium protocol (if indicated): IV dilaudid VAP protocol (if indicated): n/a DVT prophylaxis: Heparin GI prophylaxis: PPI Glucose control: SSI Mobility: bedrest Disposition: ICU  Goals of Care:  Last date of multidisciplinary goals of care discussion: 5/22 Family and staff present: Yes Summary of discussion: continue current plan of care Follow up goals of care discussion due: n/a Code Status: Full code  Labs   CBC: Recent Labs  Lab 11/08/20 1426 11/09/20 0358 11/10/20 0213 11/11/20 0418 11/11/20 1755 11/12/20 0334  WBC 17.3* 22.5* 21.0* 18.6*  --  16.3*  HGB 11.0* 10.1* 10.2* 9.5* 10.2* 9.1*  HCT 35.9* 32.8* 31.7* 29.4* 30.0* 27.7*  MCV 85.7 87.0 85.0 82.8  --  81.2  PLT 145* 100* 92* 71*  --  71*    Basic Metabolic Panel: Recent Labs  Lab 11/09/20 0358 11/10/20 0213 11/11/20 0835 11/11/20 1600 11/11/20 1755 11/12/20 0334  NA 132* 134* 135 133* 137 133*  K 4.8 4.4 4.2 4.0 3.9 3.9  CL 105 105 110 107  --  107  CO2 15* 16* 13* 13*  --  13*  GLUCOSE 147* 109* 123* 119*  --  112*  BUN 35* 48* 61* 63*  --  69*  CREATININE 2.38* 3.38* 4.64* 4.89*  --  5.50*  CALCIUM 7.4* 7.5* 7.6* 7.7*  --  7.8*   GFR: Estimated Creatinine Clearance: 16.3 mL/min (A) (by C-G formula based on SCr of 5.5 mg/dL (H)). Recent Labs  Lab 11/08/20 2035 11/09/20 0105  11/09/20 0358 11/10/20 0213 11/10/20 2140 11/11/20 0418 11/12/20 0334  WBC  --   --  22.5* 21.0*  --  18.6* 16.3*  LATICACIDVEN 4.8* 4.5*  --   --  0.8  --   --     Liver Function Tests: Recent Labs  Lab 11/08/20 1426 11/09/20 0358 11/10/20 0213 11/11/20 0835  AST 392* 200* 125* 64*  ALT 348* 263* 199* 117*  ALKPHOS 137* 103 125 173*  BILITOT 3.2* 3.5* 3.5* 3.8*  PROT 6.5 5.6* 5.7* 5.1*  ALBUMIN 3.2* 2.6* 2.6* 2.1*   Recent Labs  Lab 11/08/20 1428 11/09/20 0358  LIPASE 79* 27   No results for input(s): AMMONIA in the last 168 hours.  ABG    Component Value Date/Time   PHART 7.328 (L) 11/11/2020 1755   PCO2ART 23.3 (L) 11/11/2020 1755   PO2ART 69 (L) 11/11/2020 1755   HCO3 12.2 (L) 11/11/2020 1755   TCO2 13 (L) 11/11/2020 1755   ACIDBASEDEF 12.0 (H) 11/11/2020 1755   O2SAT 93.0 11/11/2020 1755     Coagulation Profile: Recent Labs  Lab 11/08/20 1426  INR 1.4*    Cardiac Enzymes: No  results for input(s): CKTOTAL, CKMB, CKMBINDEX, TROPONINI in the last 168 hours.  HbA1C: Hgb A1c MFr Bld  Date/Time Value Ref Range Status  11/08/2020 02:26 PM 8.3 (H) 4.8 - 5.6 % Final    Comment:    (NOTE) Pre diabetes:          5.7%-6.4%  Diabetes:              >6.4%  Glycemic control for   <7.0% adults with diabetes     CBG: Recent Labs  Lab 11/11/20 1117 11/11/20 1558 11/11/20 1941 11/11/20 2355 11/12/20 0328  GLUCAP 131* 123* 103* 121* 107*

## 2020-11-12 NOTE — Consult Note (Signed)
[facility] Renal Consultation Note  Requesting MD: Baltazar Apo, MD Indication for Consultation:  AKI   Chief complaint: admitted with chest and epigastric pain  HPI:  Tom Scott is a 65 y.o. male, diabetes atrial fibrillation who presented to epigastric and chest pain.  Several days of abd pain which worsened in the 3 days before presentation.  He was transferred to [facility] for septic shock and concern for choledocolithiasis.  He was found to have septic shock due to calculus cholecystitis and gram negative bacteremia.  He is s/p percutaneous cholecystostomy on 5/20.  His course has been complicated by AKI.  Nephrology consulted for assistance with management of the same.  He had 150 mL uop over 5/22 and he had lasix 60 mg IV once on 5/22.  He has been on bicarb gtt.  He was on LR at 100 ml/hr 5/19-5/22.  He was on levo 5/19-5/20 and phenylephrine 5/19 - 5/22.  He had a CT abd/pelvis without contrast on 5/20 with no hydro.  Critical care has placed a nontunneled catheter for HD access  His wife and daughter provided some history as he was not able to answer questions.  He said hi to his daughter but did not answer her questions, as well.    Creatinine, Ser  Date/Time Value Ref Range Status  11/12/2020 03:34 AM 5.50 (H) 0.61 - 1.24 mg/dL Final  11/11/2020 04:00 PM 4.89 (H) 0.61 - 1.24 mg/dL Final  11/11/2020 08:35 AM 4.64 (H) 0.61 - 1.24 mg/dL Final  11/10/2020 02:13 AM 3.38 (H) 0.61 - 1.24 mg/dL Final  11/09/2020 03:58 AM 2.38 (H) 0.61 - 1.24 mg/dL Final  11/08/2020 02:26 PM 1.99 (H) 0.61 - 1.24 mg/dL Final     PMHx:   Past Medical History:  Diagnosis Date  . Atrial fibrillation ([place])   . Diabetes mellitus ([place])   . Hypertension     Past Surgical History:  Procedure Laterality Date  . GASTRIC BYPASS    . IR PERC CHOLECYSTOSTOMY  11/09/2020    Family Hx:  No family history of CKD   Social History: per his wife remote tobacco abuse (decades ago); no illicit  drug use; no ETOH  Allergies:  Allergies  Allergen Reactions  . Penicillamine     Other reaction(s): lips swell  . Penicillins     Other reaction(s): Swelling  Legacy System: CCA Onset Date: <blank> Substance Legacy/Cerner: penicillins / penicillins (Legacy value) Category: Drug Severity Legacy/Cerner: <blank> / Unknown Reaction(s): swelling Comments: <blank>  Legacy System: CCA Onset Date: <blank> Substance Legacy/Cerner: ampicillin / ampicillin (Legacy value) Category: Drug Severity Legacy/Cerner: <blank> / Unknown Reaction(s): swelling Comments: <blank>  Legacy System: CCA Onset Date: <blank> Substance Legacy/Cerner: ampicillin / ampicillin (Legacy value) Category: Drug Severity Legacy/Cerner: <blank> / Unknown Reaction(s): swelling Comments: <blank>  Legacy System: CCA Onset Date: <blank> Substance Legacy/Cerner: penicillins / penicillins (Legacy value) Category: Drug Severity Legacy/Cerner: <blank> / Unknown Reaction(s): swelling Comments: <blank>     Medications: Prior to Admission medications   Medication Sig Start Date End Date Taking? Authorizing Provider  amLODipine (NORVASC) 10 MG tablet Take 10 mg by mouth every morning. 10/18/20  Yes [provider]  aspirin EC 81 MG tablet Take 162 mg by mouth daily. Swallow whole.   Yes [provider]  furosemide (LASIX) 20 MG tablet Take 40 mg by mouth daily. 09/18/20  Yes [provider]  hydrOXYzine (ATARAX/VISTARIL) 10 MG tablet Take 10-20 mg by mouth at bedtime. 10/29/20  Yes [provider]  irbesartan (AVAPRO) 150 MG tablet Take 150 mg by mouth daily. 08/30/20  Yes [provider]  metFORMIN (GLUCOPHAGE) 500 MG tablet Take 1,000 mg by mouth 2 (two) times daily. 09/14/20  Yes [provider]  metoprolol succinate (TOPROL-XL) 50 MG 24 hr tablet Take 50 mg by mouth every morning. 08/11/20  Yes [provider]  NONFORMULARY OR COMPOUNDED ITEM Apply 1  application topically 2 (two) times daily. TAC 0.1% cream + Hydrophor c&m 0.5% No more than a week at a time 08/13/20  Yes [provider]  rosuvastatin (CRESTOR) 10 MG tablet Take 10 mg by mouth at bedtime. 09/06/20  Yes [provider]    I have reviewed the patient's current and reported prior to admission medications.  Labs:  BMP Latest Ref Rng & Units 11/12/2020 11/11/2020 11/11/2020  Glucose 70 - 99 mg/dL 112(H) - 119(H)  BUN 8 - 23 mg/dL 69(H) - 63(H)  Creatinine 0.61 - 1.24 mg/dL 5.50(H) - 4.89(H)  Sodium 135 - 145 mmol/L 133(L) 137 133(L)  Potassium 3.5 - 5.1 mmol/L 3.9 3.9 4.0  Chloride 98 - 111 mmol/L 107 - 107  CO2 22 - 32 mmol/L 13(L) - 13(L)  Calcium 8.9 - 10.3 mg/dL 7.8(L) - 7.7(L)    Urinalysis No results found for: COLORURINE, APPEARANCEUR, LABSPEC, PHURINE, GLUCOSEU, HGBUR, BILIRUBINUR, KETONESUR, PROTEINUR, UROBILINOGEN, NITRITE, LEUKOCYTESUR   ROS: Unable to obtain secondary to AMS  Physical Exam: Vitals:   11/12/20 1300 11/12/20 1400  BP: 106/61 107/65  Pulse: (!) 108 (!) 108  Resp: (!) 23 19  Temp:    SpO2: 98% 99%     General: elderly male in bed  HEENT: NCAT Eyes: EOMI sclera anicteric Neck: supple trachea midline Heart: S1S2 tachycardic; no rub  Lungs: clear and unlabored at rest; on room air  Abdomen: soft/nondistended; RUQ abd drain in place; he is nontender to gentle palpation and his family states this is an improvement Extremities: nonpitting edema; no cyanosis or clubbing  Skin: no rash on extremities exposed  Neuro: awakens to voice but does not answer orienting questions  psych - calm; no agitation  Access: left IJ nontunneled HD catheter   Assessment/Plan:  # AKI  - Secondary to ATN from septic shock; pt also with afib with RVR.  On lasix and irbesartan at home  - HD access in place - nontunneled catheter placed 5/23 per critical care - appreciate their assistance  - Will start HD today - can go to the HD unit from my  standpoint - When able get UA and up/cr ratio  - stop bicarb gtt once starts HD - Plan for 5/24 treatment as well   # septic shock  - secondary to calculus cholecystitis  - off of pressors - abx per primary team   # Gram negative Bacteremia - abx per primary team   # Metabolic acidosis - for HD  - stop bicarb gtt once on HD  # Calculus cholecystitis  - s/p perc cholecystostomy 5/20   # Afib with RVR - on amio  # Encephalopathy acute - multifactorial -metabolic derangements  - Starting HD as above  Claudia Desanctis 11/12/2020,2:54 PM

## 2020-11-12 NOTE — Progress Notes (Signed)
Pharmacy Antibiotic Note  Tom Scott is a 65 y.o. male transferred to [facility] on 11/08/2020 with cholelithiasis.  Pharmacy has been consulted for meropenem (gall bladder cultures from OSH show ESBL)  The patient's AKI has worsened with SCr up to 5.5 and minimal UOP in the past 24 hours (150 cc charted). Will adjust Meropenem dose accordingly.   Plan: - Reduce Meropenem to 1g IV every 24 hours - Will continue to follow renal function, culture results, LOT, and antibiotic de-escalation plans   Height: '5\' 10"'$  (177.8 cm) Weight: 111.5 kg (245 lb 13 oz) IBW/kg (Calculated) : 73  Temp (24hrs), Avg:98.2 F (36.8 C), Min:98 F (36.7 C), Max:98.3 F (36.8 C)  Recent Labs  Lab 11/08/20 1426 11/08/20 2035 11/09/20 0105 11/09/20 0358 11/10/20 0213 11/10/20 2140 11/11/20 0418 11/11/20 0835 11/11/20 1600 11/12/20 0334  WBC 17.3*  --   --  22.5* 21.0*  --  18.6*  --   --  16.3*  CREATININE 1.99*  --   --  2.38* 3.38*  --   --  4.64* 4.89* 5.50*  LATICACIDVEN  --  4.8* 4.5*  --   --  0.8  --   --   --   --     Estimated Creatinine Clearance: 16.3 mL/min (A) (by C-G formula based on SCr of 5.5 mg/dL (H)).    Allergies  Allergen Reactions  . Penicillamine     Other reaction(s): lips swell  . Penicillins     Other reaction(s): Swelling  Legacy System: CCA Onset Date: <blank> Substance Legacy/Cerner: penicillins / penicillins (Legacy value) Category: Drug Severity Legacy/Cerner: <blank> / Unknown Reaction(s): swelling Comments: <blank>  Legacy System: CCA Onset Date: <blank> Substance Legacy/Cerner: ampicillin / ampicillin (Legacy value) Category: Drug Severity Legacy/Cerner: <blank> / Unknown Reaction(s): swelling Comments: <blank>  Legacy System: CCA Onset Date: <blank> Substance Legacy/Cerner: ampicillin / ampicillin (Legacy value) Category: Drug Severity Legacy/Cerner: <blank> / Unknown Reaction(s): swelling Comments: <blank>  Legacy System: CCA Onset Date:  <blank> Substance Legacy/Cerner: penicillins / penicillins (Legacy value) Category: Drug Severity Legacy/Cerner: <blank> / Unknown Reaction(s): swelling Comments: <blank>     LVQ x1 PTA  Flagyl PTA 5/19 >> 5/21 Cefepime 5/19 >> 5/21 Merrem 5/21>>  5/19 MRSA PCR - negative 5/20 BCx >> ngtd 5/20 Gall bladder abscess >> abundant GNR, holding for anaerobie  Culture from [place]: 5/19 BCx - 3/4 E.coli (ESBL per chart notes) and 2/4 Bacteroides fragilis, preliminary  Thank you for allowing pharmacy to be a part of this patient's care.  Alycia Rossetti, PharmD, BCPS Clinical Pharmacist Clinical phone for 11/12/2020: [phone] 11/12/2020 7:45 AM   **Pharmacist phone directory can now be found on [email or web address] ([email or web address]).  Listed under [place].

## 2020-11-13 DIAGNOSIS — L8990 Pressure ulcer of unspecified site, unspecified stage: Secondary | ICD-10-CM | POA: Insufficient documentation

## 2020-11-13 DIAGNOSIS — R6521 Severe sepsis with septic shock: Secondary | ICD-10-CM | POA: Diagnosis not present

## 2020-11-13 DIAGNOSIS — A419 Sepsis, unspecified organism: Secondary | ICD-10-CM | POA: Diagnosis not present

## 2020-11-13 LAB — CBC
HCT: 28.8 % — ABNORMAL LOW (ref 39.0–52.0)
Hemoglobin: 10 g/dL — ABNORMAL LOW (ref 13.0–17.0)
MCH: 26.6 pg (ref 26.0–34.0)
MCHC: 34.7 g/dL (ref 30.0–36.0)
MCV: 76.6 fL — ABNORMAL LOW (ref 80.0–100.0)
Platelets: 70 10*3/uL — ABNORMAL LOW (ref 150–400)
RBC: 3.76 MIL/uL — ABNORMAL LOW (ref 4.22–5.81)
RDW: 17.8 % — ABNORMAL HIGH (ref 11.5–15.5)
WBC: 17.2 10*3/uL — ABNORMAL HIGH (ref 4.0–10.5)
nRBC: 0 % (ref 0.0–0.2)

## 2020-11-13 LAB — COMPREHENSIVE METABOLIC PANEL
ALT: 70 U/L — ABNORMAL HIGH (ref 0–44)
AST: 31 U/L (ref 15–41)
Albumin: 2 g/dL — ABNORMAL LOW (ref 3.5–5.0)
Alkaline Phosphatase: 177 U/L — ABNORMAL HIGH (ref 38–126)
Anion gap: 15 (ref 5–15)
BUN: 54 mg/dL — ABNORMAL HIGH (ref 8–23)
CO2: 17 mmol/L — ABNORMAL LOW (ref 22–32)
Calcium: 8.3 mg/dL — ABNORMAL LOW (ref 8.9–10.3)
Chloride: 105 mmol/L (ref 98–111)
Creatinine, Ser: 5.07 mg/dL — ABNORMAL HIGH (ref 0.61–1.24)
GFR, Estimated: 12 mL/min — ABNORMAL LOW (ref >60)
Glucose, Bld: 112 mg/dL — ABNORMAL HIGH (ref 70–99)
Potassium: 3.9 mmol/L (ref 3.5–5.1)
Sodium: 137 mmol/L (ref 135–145)
Total Bilirubin: 2.6 mg/dL — ABNORMAL HIGH (ref 0.3–1.2)
Total Protein: 5.6 g/dL — ABNORMAL LOW (ref 6.5–8.1)

## 2020-11-13 LAB — GLUCOSE, CAPILLARY
Glucose-Capillary: 107 mg/dL — ABNORMAL HIGH (ref 70–99)
Glucose-Capillary: 121 mg/dL — ABNORMAL HIGH (ref 70–99)
Glucose-Capillary: 107 mg/dL — ABNORMAL HIGH (ref 70–99)
Glucose-Capillary: 116 mg/dL — ABNORMAL HIGH (ref 70–99)
Glucose-Capillary: 113 mg/dL — ABNORMAL HIGH (ref 70–99)
Glucose-Capillary: 120 mg/dL — ABNORMAL HIGH (ref 70–99)

## 2020-11-13 LAB — URIC ACID: Uric Acid, Serum: 5.8 mg/dL (ref 3.7–8.6)

## 2020-11-13 LAB — VITAMIN D 25 HYDROXY (VIT D DEFICIENCY, FRACTURES): Vit D, 25-Hydroxy: 17.47 ng/mL — ABNORMAL LOW (ref 30–100)

## 2020-11-13 LAB — VITAMIN B12: Vitamin B-12: 2699 pg/mL — ABNORMAL HIGH (ref 180–914)

## 2020-11-13 LAB — HEPARIN LEVEL (UNFRACTIONATED)
Heparin Unfractionated: 0.42 IU/mL (ref 0.30–0.70)
Heparin Unfractionated: 0.3 IU/mL (ref 0.30–0.70)

## 2020-11-13 MED ORDER — FENTANYL CITRATE (PF) 100 MCG/2ML IJ SOLN
INTRAMUSCULAR | Status: AC
  Administered 2020-11-13: 12.5 ug
  Filled 2020-11-13: qty 2

## 2020-11-13 MED ORDER — FENTANYL CITRATE (PF) 100 MCG/2ML IJ SOLN: 12.5000 ug | Freq: Once | INTRAMUSCULAR | Status: DC | PRN

## 2020-11-13 MED ORDER — CHLORHEXIDINE GLUCONATE CLOTH 2 % EX PADS
6.0000 | MEDICATED_PAD | Freq: Every day | CUTANEOUS | Status: DC
  Administered 2020-11-13 – 2020-11-16 (×3): 6 via TOPICAL

## 2020-11-13 MED ORDER — HEPARIN SODIUM (PORCINE) 1000 UNIT/ML IJ SOLN
INTRAMUSCULAR | Status: AC
  Filled 2020-11-13: qty 3

## 2020-11-13 MED ORDER — FENTANYL CITRATE (PF) 100 MCG/2ML IJ SOLN: 12.5000 ug | Freq: Once | INTRAMUSCULAR | Status: AC

## 2020-11-13 MED ORDER — SODIUM CHLORIDE 0.9 % IV SOLN
500.0000 mg | INTRAVENOUS | Status: AC
  Administered 2020-11-13 – 2020-11-19 (×7): 500 mg via INTRAVENOUS
  Filled 2020-11-13 (×2): qty 0.5
  Filled 2020-11-13: qty 500
  Filled 2020-11-13 (×2): qty 0.5
  Filled 2020-11-13: qty 500
  Filled 2020-11-13 (×2): qty 0.5

## 2020-11-13 MED ORDER — FENTANYL CITRATE (PF) 100 MCG/2ML IJ SOLN
12.5000 ug | INTRAMUSCULAR | Status: DC | PRN
  Administered 2020-11-13 (×2): 12.5 ug via INTRAVENOUS
  Filled 2020-11-13 (×2): qty 2

## 2020-11-13 MED ORDER — HEPARIN SODIUM (PORCINE) 1000 UNIT/ML IJ SOLN
INTRAMUSCULAR | Status: AC
  Filled 2020-11-13: qty 4

## 2020-11-13 NOTE — Progress Notes (Addendum)
NAME:  Tom Scott, MRN:  [id number], DOB:  01-04-1956, LOS: 5 ADMISSION DATE:  11/08/2020, CONSULTATION DATE: 11/08/2020  CHIEF COMPLAINT:  Septic shock   History of Present Illness:   79 yoM with a history of atrial fibrillation on xarelto (not taking since 3/22), hypertension, DM, and HLD who initially presented to [facility] in [place] for chest and epigastric pain. Transferred to [facility] due to septic shock requiring pressors and concerns for choledocholithiasis.   He reports 2 weeks of intermittent abdominal pain that worsened 3 days ago. Endorses nausea, denies emesis, fevers, diarrhea. Sign out prior to transfer expressed concern for choledocholithiasis. On arrival, documentaion included a CT abdomen with cholelithiasis no cholecystitis. He also had lactic acidosis, transaminitis and an elevated t bili. He was also with atrial fibrillation with RVR and hypotensive started on pressors. Of note, he stopped taking Xarelto in March 2022 secondary to finances and has been taking aspirin. He   Past Medical History:  Atrial fibrillation HTN DM HLD Hx of gastric bypass   Significant Hospital Events:  - 5/19 admitted to [facility] ICU - 5/20 repeat US and CT abdomen showed acute calculous cholecystitis. IR placed percutaneous drain   Consults:  GI General surgery  Procedures:  - 5/19 central venous catheter  - 5/29 percutaneous cholecystostomy tube placed   - 5/23 nontunneled HD catheter placed    Significant Diagnostic Tests:  US abdomen and CT abdomen show acute calculous cholecystitis  RLE vascular US negative for DVT   Micro Data:   Gallbladder fluid 5/20 >> Gram variable rods, GPC's >>  Blood 5/20 >> Ecoli, ESBL   Antimicrobials:  Metronidazole 5/18> 5/21 Cefepime 5/19> 5/21 Levaquin 5/18 Meropenem 5/21 >>   Interim History / Subjective:   Overnight, no acute events. Patient's mentation continues to be poor. Unable to follow simple commands this morning, wakes  up briefly but very somnolent. Exquisitely tender to light touch diffusely.   Objective   Blood pressure 101/60, pulse 100, temperature 98.3 F (36.8 C), temperature source Axillary, resp. rate 13, height '5\' 10"'$  (1.778 m), weight 111.4 kg, SpO2 98 %.        Intake/Output Summary (Last 24 hours) at 11/13/2020 [id number] Last data filed at 11/13/2020 0600 Gross per 24 hour  Intake 1322 ml  Output 462 ml  Net 860 ml   Filed Weights   11/08/20 1600 11/11/20 0500 11/13/20 0500  Weight: 98 kg 111.5 kg 111.4 kg    Examination: General: Elderly male, appears acutely ill, uncomfortable and agitated  HENT: Oropharynx clear, no secretions, no stridor Lungs: Clear bilaterally, decreased to both bases Cardiovascular: Irregularly irregular, no murmur Abdomen: Nondistended, obese, upper quadrant tenderness, no rebound, perc drain in place w/o signs of infection, clean and dry Extremities: RLE swelling, extremely tender to palpation Neuro: somnolent, not oriented, unable to answer questions or follow any commands, worsening since 5/22 Skin: No rash, warm  Resolved Hospital Problem list   none  Assessment & Plan:   Septic shock Acute calculous cholecystitis with E. coli bacteremia, ESBL positive  Transaminitis -Underwent percutaneous cholecystostomy 5/20 -Remains off pressors -Continue meropenem -GI and general surgery on board, appreciate recommendations  -Overall tenuous clinical picture, patient slow to recover despite appropriate interventions -Extensive discussion with family about goals of care, currently remains full code and to continue full scope of care  Acute metabolic encephalopathy  - Likely multifactorial in the setting of sepsis, uremia, hospital delirium and use of narcotics  - Plan for HD again today -  Will decrease dilaudid - Avoid sedating medications as able  - Continue to monitor    AKI - Likely in the setting of septic shock and use of contrast  - Cr stable at 5.1  today - He is still making some urine  - Had HD yesterday, plan for HD again today  - Trend BMP  Afib w/ RVR -Secondary to sepsis, HR improved -IV amiodarone again in the setting of AMS  -Continue heparin -Continue telemetry  Right leg swelling Diffuse pain  - RLE vascular US negative for DVT - Uric acid is wnl, unlikely gout but cannot rule out  - 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 vs gout, unclear etiology - Careful titration of pain medications    HTN -Home antihypertensive regimen is on hold  DM -SSI -CBG monitoring  HLD -Restart statin when we confirm he is taking good p.o.  Protein caloric malnutrition - Consider NGT placement for tube feeds  - Goals of care discussion initiated with family    Best practice (evaluated daily)  Diet: NPO Pain/Anxiety/Delirium protocol (if indicated): IV dilaudid VAP protocol (if indicated): n/a DVT prophylaxis: Heparin GI prophylaxis: PPI Glucose control: SSI Mobility: bedrest Disposition: ICU  Goals of Care:  Last date of multidisciplinary goals of care discussion: 5/24 Family and staff present: Yes Summary of discussion: continue current plan of care Follow up goals of care discussion due: n/a Code Status: Full code  Labs   CBC: Recent Labs  Lab 11/09/20 0358 11/10/20 0213 11/11/20 0418 11/11/20 1755 11/12/20 0334 11/13/20 0338  WBC 22.5* 21.0* 18.6*  --  16.3* 17.2*  HGB 10.1* 10.2* 9.5* 10.2* 9.1* 10.0*  HCT 32.8* 31.7* 29.4* 30.0* 27.7* 28.8*  MCV 87.0 85.0 82.8  --  81.2 76.6*  PLT 100* 92* 71*  --  71* 70*    Basic Metabolic Panel: Recent Labs  Lab 11/09/20 0358 11/10/20 0213 11/11/20 0835 11/11/20 1600 11/11/20 1755 11/12/20 0334  NA 132* 134* 135 133* 137 133*  K 4.8 4.4 4.2 4.0 3.9 3.9  CL 105 105 110 107  --  107  CO2 15* 16* 13* 13*  --  13*  GLUCOSE 147* 109* 123* 119*  --  112*  BUN 35* 48* 61* 63*  --  69*  CREATININE 2.38* 3.38* 4.64* 4.89*  --  5.50*  CALCIUM 7.4* 7.5* 7.6* 7.7*  --   7.8*   GFR: Estimated Creatinine Clearance: 16.3 mL/min (A) (by C-G formula based on SCr of 5.5 mg/dL (H)). Recent Labs  Lab 11/08/20 2035 11/09/20 0105 11/09/20 0358 11/10/20 0213 11/10/20 2140 11/11/20 0418 11/12/20 0334 11/13/20 0338  WBC  --   --    < > 21.0*  --  18.6* 16.3* 17.2*  LATICACIDVEN 4.8* 4.5*  --   --  0.8  --   --   --    < > = values in this interval not displayed.    Liver Function Tests: Recent Labs  Lab 11/08/20 1426 11/09/20 0358 11/10/20 0213 11/11/20 0835 11/12/20 0927  AST 392* 200* 125* 64* 45*  ALT 348* 263* 199* 117* 89*  ALKPHOS 137* 103 125 173* 170*  BILITOT 3.2* 3.5* 3.5* 3.8* 2.9*  PROT 6.5 5.6* 5.7* 5.1* 5.3*  ALBUMIN 3.2* 2.6* 2.6* 2.1* 2.0*   Recent Labs  Lab 11/08/20 1428 11/09/20 0358  LIPASE 79* 27   No results for input(s): AMMONIA in the last 168 hours.  ABG    Component Value Date/Time   PHART 7.328 (L) 11/11/2020 1755  PCO2ART 23.3 (L) 11/11/2020 1755   PO2ART 69 (L) 11/11/2020 1755   HCO3 12.2 (L) 11/11/2020 1755   TCO2 13 (L) 11/11/2020 1755   ACIDBASEDEF 12.0 (H) 11/11/2020 1755   O2SAT 93.0 11/11/2020 1755     Coagulation Profile: Recent Labs  Lab 11/08/20 1426  INR 1.4*    Cardiac Enzymes: No results for input(s): CKTOTAL, CKMB, CKMBINDEX, TROPONINI in the last 168 hours.  HbA1C: Hgb A1c MFr Bld  Date/Time Value Ref Range Status  11/08/2020 02:26 PM 8.3 (H) 4.8 - 5.6 % Final    Comment:    (NOTE) Pre diabetes:          5.7%-6.4%  Diabetes:              >6.4%  Glycemic control for   <7.0% adults with diabetes     CBG: Recent Labs  Lab 11/12/20 1103 11/12/20 1613 11/12/20 1954 11/12/20 2305 11/13/20 0356  GLUCAP 113* 134* 141* 121* 107*

## 2020-11-13 NOTE — Progress Notes (Addendum)
Initial Nutrition Assessment  DOCUMENTATION CODES:   Severe malnutrition in context of acute illness/injury  INTERVENTION:   If within [place], recommend Cortrak placement and initiation of enteral nutrition. Recommend: - Start Vital 1.5 @ 20 ml/hr and advance by 10 ml q 8 hours to goal rate of 60 ml/hr (1440 ml/day) - ProSource TF 45 ml TID  Recommended tube feeding regimen at goal rate provides 2280 kcal, 130 grams of protein, and 1100 ml of H2O.  If tube feeds initiated, monitor magnesium, potassium, and phosphorus daily for at least 3 days, MD to replete as needed, as pt is at risk for refeeding syndrome given severe malnutrition, inadequate nutrition x 7 days.  - If Cortrak placed, recommend MVI with minerals BID per tube  - If Cortrak placed, recommend 500 mg calcium carbonate TID per tube  - ADDENDUM (1502): Discussed ordering vitamin/mineral labs with MD. Vitamin A, vitamin D, vitamin C, vitamin B-12, thiamine, and zinc labs pending  NUTRITION DIAGNOSIS:   Severe Malnutrition related to acute illness (cholecystitis) as evidenced by mild fat depletion,moderate muscle depletion,energy intake < or equal to 50% for > or equal to 5 days.  GOAL:   Patient will meet greater than or equal to 90% of their needs  MONITOR:   Diet advancement, Labs, Weight trends, Skin, I & O's, Other (GOC)  REASON FOR ASSESSMENT:   NPO/Clear Liquid Diet    ASSESSMENT:   65 year old male who presented on 5/19 from [facility] with septic shock, cholecystitis. 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 Pt admitted with acute calculous cholecystitis, AKI.   5/20 - NG tube placed, percutaneous cholecystostomy tube placement 5/21 - NG tube removed, clear liquids 5/23 - first HD, NPO  Discussed pt with RN and during ICU rounds. Pt had first HD yesterday with 0 ml net UF. Plan is for HD again today.  Spoke with pt's wife and daughter at  bedside. They report that pt's last normal meal was on Tuesday, 5/17. They state that over the weekend, pt consumed some sherbet and chicken broth but otherwise has not had anything to eat. They report that PTA pt had a good appetite and ate well. They believe that his UBW is around 250 lbs but report that pt has intentionally lost about 25-30 lbs recently by following a diabetic diet.  Confirmed with pt's family that pt had gastric bypass surgery about 15-20 years ago in [place]. 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 Pt's wife states that pt was not taking any vitamins or mineral supplements.  Discussed pt with [facility]CM. [place] discussions ongoing. Discussed recommendation for Cortrak placement and initiation of enteral nutrition if within [place]. Also recommend checking vitamin A, vitamin D, vitamin C, vitamin B-12, thiamine, and zinc lab values on patient. Will reach out to CCM MD to discuss.  Admit with: 98 kg Current weight: 111.4 kg  Medications reviewed and include: SSI q 4 hours, IV protonix, amiodarone gtt, heparin gtt, IV abx  Labs reviewed: BUN 54, creatinine 5.07, hemoglobin 10.0 CBG's: 107-141 x 24 hours  UOP: 107 ml x 24 hours Perc chole drain: 355 ml x 24 hours I/O's: +14.1 L since admit  NUTRITION - FOCUSED PHYSICAL EXAM:  NFPE was limited due to pain.  Flowsheet Row Most Recent Value  Orbital Region Mild depletion  Upper Arm Region Mild depletion  Thoracic and Lumbar Region Unable to assess  [pain]  Buccal Region Mild depletion  [employer]  Mild depletion  Clavicle Bone Region Moderate depletion  Clavicle and Acromion Bone Region Moderate depletion  Scapular Bone Region Unable to assess  Dorsal Hand Mild depletion  Patellar Region Unable to assess  Anterior Thigh Region Unable to assess  Posterior Calf Region Unable to assess  Edema (RD Assessment) Moderate  [BUE, BLE]  Hair Reviewed  Eyes Reviewed  Mouth Reviewed  Skin Reviewed  Nails Reviewed        Diet Order:   Diet Order            Diet NPO time specified  Diet effective now                 EDUCATION NEEDS:   No education needs have been identified at this time  Skin:  Skin Assessment: Skin Integrity Issues: Stage II: coccyx  Last BM:  11/12/20  Height:   Ht Readings from Last 1 Encounters:  11/08/20 '5\' 10"'$  (1.778 m)    Weight:   Wt Readings from Last 1 Encounters:  11/13/20 111.4 kg    BMI:  Body mass index is 35.24 kg/m.  Estimated Nutritional Needs:   Kcal:  [id number]  Protein:  125-145 grams  Fluid:  >/= 2.0 L    Gustavus Bryant, MS, RD, LDN Inpatient Clinical Dietitian Please see AMiON for contact information.

## 2020-11-13 NOTE — Progress Notes (Signed)
ANTICOAGULATION CONSULT NOTE   Pharmacy Consult for heparin Indication: atrial fibrillation  Patient Measurements: Height: '5\' 10"'$  (177.8 cm) Weight: 111.4 kg (245 lb 9.5 oz) IBW/kg (Calculated) : 73 Heparin Dosing Weight: 93.3 kg  Vital Signs: Temp: 98.2 F (36.8 C) (05/24 0820) Temp Source: Axillary (05/24 0820) BP: 101/60 (05/24 0600) Pulse Rate: 100 (05/24 0600)  Labs: Recent Labs    11/11/20 0418 11/11/20 0610 11/11/20 1600 11/11/20 1755 11/12/20 0141 11/12/20 0334 11/12/20 2200 11/13/20 0338 11/13/20 0657 11/13/20 0800  HGB 9.5*  --   --  10.2*  --  9.1*  --  10.0*  --   --   HCT 29.4*  --   --  30.0*  --  27.7*  --  28.8*  --   --   PLT 71*  --   --   --   --  71*  --  70*  --   --   HEPARINUNFRC  --    < > 0.22*  --  0.27*  --  0.25*  --   --  0.42  CREATININE  --    < > 4.89*  --   --  5.50*  --   --  5.07*  --    < > = values in this interval not displayed.    Estimated Creatinine Clearance: 17.7 mL/min (A) (by C-G formula based on SCr of 5.07 mg/dL (H)).   Assessment: 33 yof presenting with cholelithiasis - now found to be in Afib with RVR.  Underwent perc cholecystomy 5/20 - was on Xarelto PTA (but off since March d/t cost).   Heparin level therapeutic (0.42) on gtt at 2700 units/hr. No issues with line or bleeding reported per RN. CBC low-stable.   Goal of Therapy:  Heparin level 0.3-0.7 units/ml Monitor platelets by anticoagulation protocol: Yes   Plan:  Continue heparin to 2700 units/hr Confirm with 8hr level Daily heparin level and CBC while on therapy  Sloan Leiter, PharmD, BCPS, BCCCP Clinical Pharmacist Please refer to [facility] for [place] numbers 11/13/2020 9:38 AM

## 2020-11-13 NOTE — Consult Note (Addendum)
[facility] Nurse Consult Note: Reason for Consult: Consult requested for buttocks.  Pt is critically ill with thin skin which bruises and tears easily; as evidenced by other abrasions to upper extremities.  Wound type: Buttocks with Stage 2 pressure injury; 4X3X.1cm red, clear fluid-filled blister to buttocks/inner gluteal cleft, beginning to lift and peel at the edges. Pressure Injury POA: No Dressing procedure/placement/frequency: Pt is on a low loss air mattress to reduce pressure. Topical treatment orders provided for bedside nurses to perform as follows to protect from further injury: Foam dressing to buttocks, change Q 3 days or PRN soiling. Please re-consult if further assistance is needed.  Thank-you,  Julien Girt MSN, [place], [place], [place], [place]

## 2020-11-13 NOTE — Progress Notes (Signed)
[place] for IV Heparin Indication: atrial fibrillation  Patient Measurements: Height: '5\' 10"'$  (177.8 cm) Weight: 113.3 kg (249 lb 12.5 oz) IBW/kg (Calculated) : 73 Heparin Dosing Weight: 93.3 kg  Vital Signs: Temp: 98.3 F (36.8 C) (05/24 1400) Temp Source: Axillary (05/24 1400) BP: 91/42 (05/24 1615) Pulse Rate: 115 (05/24 1615)  Labs: Recent Labs    11/11/20 0418 11/11/20 0610 11/11/20 1600 11/11/20 1755 11/12/20 0141 11/12/20 0334 11/12/20 2200 11/13/20 0338 11/13/20 0657 11/13/20 0800 11/13/20 1642  HGB 9.5*  --   --  10.2*  --  9.1*  --  10.0*  --   --   --   HCT 29.4*  --   --  30.0*  --  27.7*  --  28.8*  --   --   --   PLT 71*  --   --   --   --  71*  --  70*  --   --   --   HEPARINUNFRC  --    < > 0.22*  --    < >  --  0.25*  --   --  0.42 0.30  CREATININE  --    < > 4.89*  --   --  5.50*  --   --  5.07*  --   --    < > = values in this interval not displayed.    Estimated Creatinine Clearance: 17.8 mL/min (A) (by C-G formula based on SCr of 5.07 mg/dL (H)).   Assessment: 65 yr old presented with cholelithiasis - now found to be in Afib with RVR.  Underwent perc cholecystomy 5/20 - was on Xarelto PTA (but off since March, due to cost).   Confirmatory heparin level ~9 hrs after initial therapeutic heparin level on heparin infusion at 2700 units/hr was 0.30 units/ml, which is at the low end of the goal range for this pt. H/H 10.0/28.8, plt 70 (CBC stable over past ~3 days). Per RN, no issues with IV or bleeding observed.  Goal of Therapy:  Heparin level 0.3-0.7 units/ml Monitor platelets by anticoagulation protocol: Yes   Plan:  Increase heparin infusion to 2750 units/hr Check heparin level in 8 hrs Monitor daily heparin level, CBC Monitor for bleeding  Gillermina Hu, PharmD, BCPS, [facility] Clinical Pharmacist 11/13/2020 5:20 PM

## 2020-11-13 NOTE — Progress Notes (Signed)
[facility] Progress Note  Name: Socorro Garbers MRN: [id number] DOB: 1955-10-25  Chief Complaint:  abd pain and chest pain  Subjective:  Got Hd on 5/23 late evening.  No UF.  He has been started on amio gtt.  Not following commands per nursing.   Review of systems:  Unable to obtain 2/2 AMS ----------- Background on consult:  Aadin Laton is a 65 y.o. male, diabetes atrial fibrillation who presented to epigastric and chest pain.  Several days of abd pain which worsened in the 3 days before presentation.  He was transferred to [facility] for septic shock and concern for choledocolithiasis.  He was found to have septic shock due to calculus cholecystitis and gram negative bacteremia.  He is s/p percutaneous cholecystostomy on 5/20.  His course has been complicated by AKI.  Nephrology consulted for assistance with management of the same.  He had 150 mL uop over 5/22 and he had lasix 60 mg IV once on 5/22.  He has been on bicarb gtt.  He was on LR at 100 ml/hr 5/19-5/22.  He was on levo 5/19-5/20 and phenylephrine 5/19 - 5/22.  He had a CT abd/pelvis without contrast on 5/20 with no hydro.  Critical care has placed a nontunneled catheter for HD access.  His wife and daughter provided some history as he was not able to answer questions.  He said hi to his daughter but did not answer her questions, as well.    Intake/Output Summary (Last 24 hours) at 11/13/2020 0647 Last data filed at 11/13/2020 0600 Gross per 24 hour  Intake 1433.03 ml  Output 462 ml  Net 971.03 ml    Vitals:  Vitals:   11/13/20 0345 11/13/20 0400 11/13/20 0500 11/13/20 0600  BP: (!) '88/62 94/62 94/62 '$ 101/60  Pulse: (!) 102 (!) 109 100 100  Resp: '15 17 15 13  '$ Temp:      TempSrc:      SpO2: 99% 99% 99% 98%  Weight:   111.4 kg   Height:         Physical Exam:  General: elderly male in bed  HEENT: NCAT Eyes: EOMI sclera anicteric Neck: supple trachea midline Heart: S1S2 tachycardic; no rub  Lungs: clear  and unlabored at rest; on room air  Abdomen: soft/nondistended; RUQ abd drain in place Extremities: 1+ pitting edema; no cyanosis or clubbing  Skin: no rash on extremities exposed  Neuro: awakens to voice but does not answer orienting questions  psych - calm; no agitation  Access: left IJ nontunneled HD catheter   Medications reviewed   Labs:  BMP Latest Ref Rng & Units 11/12/2020 11/11/2020 11/11/2020  Glucose 70 - 99 mg/dL 112(H) - 119(H)  BUN 8 - 23 mg/dL 69(H) - 63(H)  Creatinine 0.61 - 1.24 mg/dL 5.50(H) - 4.89(H)  Sodium 135 - 145 mmol/L 133(L) 137 133(L)  Potassium 3.5 - 5.1 mmol/L 3.9 3.9 4.0  Chloride 98 - 111 mmol/L 107 - 107  CO2 22 - 32 mmol/L 13(L) - 13(L)  Calcium 8.9 - 10.3 mg/dL 7.8(L) - 7.7(L)     Assessment/Plan:   # AKI  - Secondary to ATN from septic shock; pt also with afib with RVR.  On lasix and irbesartan at home.  Started HD on 5/23 late pm. after nontunneled catheter placed by critical care.  - await CMP - ordered not obtained  - When able get UA and up/cr ratio   - stop bicarb gtt  - Plan for 5/24 treatment as  well - in the HD unit is ok with me   # septic shock  - secondary to calculus cholecystitis  - off of pressors - abx per primary team   # Gram negative Bacteremia - abx per primary team   # Metabolic acidosis - for HD  - stop bicarb   # Calculus cholecystitis  - s/p perc cholecystostomy 5/20   # Afib with RVR - on amio  # Encephalopathy acute - multifactorial -metabolic derangements  - HD as above   Claudia Desanctis, MD 11/13/2020 6:59 AM

## 2020-11-13 NOTE — Progress Notes (Signed)
Patient dropped of in HD unit. Report was called and patient stable and calm upon drop off. HD RN to call if patient needs anything.

## 2020-11-13 NOTE — Progress Notes (Signed)
Subjective: Patient having horrible pain everywhere to touch.  He is somnolent today and doesn't really interact much.    ROS: unable, lethargic  Objective: Vital signs in last 24 hours: Temp:  [97.6 F (36.4 C)-98.3 F (36.8 C)] 98.2 F (36.8 C) (05/24 0820) Pulse Rate:  [94-119] 100 (05/24 0600) Resp:  [13-24] 13 (05/24 0600) BP: (87-127)/(57-90) 101/60 (05/24 0600) SpO2:  [92 %-100 %] 98 % (05/24 0600) Weight:  [111.4 kg] 111.4 kg (05/24 0500) Last BM Date: 11/12/20  Intake/Output from previous day: 05/23 0701 - 05/24 0700 In: 1322 [I.V.:1222; IV Piggyback:100] Out: 462 [Urine:107; Drains:355] Intake/Output this shift: No intake/output data recorded.  PE: Abd: soft, diffusely tender, but to light touch, deep touch, hypersensitive to touch.  Perc chole drain in place with bilious output present.  +bs Skin: hypersensitive to touch everywhere all over his body  Lab Results:  Recent Labs    11/12/20 0334 11/13/20 0338  WBC 16.3* 17.2*  HGB 9.1* 10.0*  HCT 27.7* 28.8*  PLT 71* 70*   BMET Recent Labs    11/12/20 0334 11/13/20 0657  NA 133* 137  K 3.9 3.9  CL 107 105  CO2 13* 17*  GLUCOSE 112* 112*  BUN 69* 54*  CREATININE 5.50* 5.07*  CALCIUM 7.8* 8.3*   PT/INR No results for input(s): LABPROT, INR in the last 72 hours. CMP     Component Value Date/Time   NA 137 11/13/2020 0657   K 3.9 11/13/2020 0657   CL 105 11/13/2020 0657   CO2 17 (L) 11/13/2020 0657   GLUCOSE 112 (H) 11/13/2020 0657   BUN 54 (H) 11/13/2020 0657   CREATININE 5.07 (H) 11/13/2020 0657   CALCIUM 8.3 (L) 11/13/2020 0657   PROT 5.6 (L) 11/13/2020 0657   ALBUMIN 2.0 (L) 11/13/2020 0657   AST 31 11/13/2020 0657   ALT 70 (H) 11/13/2020 0657   ALKPHOS 177 (H) 11/13/2020 0657   BILITOT 2.6 (H) 11/13/2020 0657   GFRNONAA 12 (L) 11/13/2020 0657   Lipase     Component Value Date/Time   LIPASE 27 11/09/2020 0358       Studies/Results: DG Chest 1 View  Result Date:  11/12/2020 Mike Craze, DO     11/12/2020 12:16 PM Central Venous Catheter Insertion Procedure Note Kaelon Fishler [id number] November 30, 1955 Procedure: Insertion of Non-tunneled HD Catheter with US guidance Indications: HD Procedure Details Consent: Risks of procedure as well as the alternatives and risks of each were explained to the (patient/caregiver).  Consent for procedure obtained. Time Out: Verified patient identification, verified procedure, site/side was marked, verified correct patient position, special equipment/implants available, medications/allergies/relevent history reviewed, required imaging and test results available.  Performed Maximum sterile technique was used including antiseptics, cap, gloves, gown, hand hygiene, mask and sheet. Skin prep: Chlorhexidine; local anesthetic administered A antimicrobial bonded/coated triple lumen catheter was placed in the left internal jugular vein using the Seldinger technique. Catheter placed to 15 cm. Blood aspirated via all 3 ports and then flushed x 3. Line sutured x 2 and dressing applied. Ultrasound guidance used.Yes.  Evaluation Blood flow good Complications: No apparent complications Patient did tolerate procedure well. Chest X-ray ordered to verify placement.  CXR: pending. Harlow Ohms, DO PGY-2 IMTS   DG CHEST PORT 1 VIEW  Result Date: 11/12/2020 CLINICAL DATA:  Central line placement EXAM: PORTABLE CHEST 1 VIEW COMPARISON:  Portable exam 1412 hours compared to 11/08/2020 FINDINGS: Indwelling RIGHT jugular central venous catheter with tip projecting  over cavoatrial junction. New LEFT subclavian central venous catheter with tip projecting over LEFT brachiocephalic vein. Enlargement of cardiac silhouette with pulmonary vascular congestion. Atherosclerotic calcification aorta. Bibasilar atelectasis and question minimal perihilar edema. No definite pleural effusion or pneumothorax. IMPRESSION: Enlargement of cardiac silhouette with pulmonary vascular  congestion and question minimal perihilar edema. Bibasilar atelectasis. No pneumothorax following LEFT jugular line placement. Electronically Signed   By: Lavonia Dana M.D.   On: 11/12/2020 14:42   VAS [place] LOWER EXTREMITY VENOUS (DVT)  Result Date: 11/12/2020  Lower Venous DVT Study Patient Name:  Tom Scott  Date of Exam:   11/12/2020 Medical Rec #: [id number]      Accession #:    [id number] Date of Birth: [birth year]      Patient Gender: M Patient Age:   65Y Exam Location:  [facility] Procedure:      VAS [place] LOWER EXTREMITY VENOUS (DVT) Referring Phys: [place] --------------------------------------------------------------------------------  Indications: Swelling, and Pain.  Risk Factors: None identified. Limitations: Body habitus, poor ultrasound/tissue interface and patient movement, patient pain tolerance, patient guarding. Comparison Study: No prior studies. Performing Technologist: Oliver Hum RVT  Examination Guidelines: A complete evaluation includes B-mode imaging, spectral Doppler, color Doppler, and power Doppler as needed of all accessible portions of each vessel. Bilateral testing is considered an integral part of a complete examination. Limited examinations for reoccurring indications may be performed as noted. The reflux portion of the exam is performed with the patient in reverse Trendelenburg.  +---------+---------------+---------+-----------+----------+-------------------+ RIGHT    CompressibilityPhasicitySpontaneityPropertiesThrombus Aging      +---------+---------------+---------+-----------+----------+-------------------+ CFV      Full           Yes      Yes                                      +---------+---------------+---------+-----------+----------+-------------------+ SFJ      Full                                                             +---------+---------------+---------+-----------+----------+-------------------+ FV Prox  Full                                                              +---------+---------------+---------+-----------+----------+-------------------+ FV Mid                  Yes      Yes                                      +---------+---------------+---------+-----------+----------+-------------------+ FV Distal               Yes      Yes                                      +---------+---------------+---------+-----------+----------+-------------------+ PFV  Not well visualized +---------+---------------+---------+-----------+----------+-------------------+ POP                     Yes      Yes                                      +---------+---------------+---------+-----------+----------+-------------------+ PTV      Full                                                             +---------+---------------+---------+-----------+----------+-------------------+ PERO     Full                                                             +---------+---------------+---------+-----------+----------+-------------------+   +----+---------------+---------+-----------+----------+--------------+ LEFTCompressibilityPhasicitySpontaneityPropertiesThrombus Aging +----+---------------+---------+-----------+----------+--------------+ CFV Full           Yes      Yes                                 +----+---------------+---------+-----------+----------+--------------+    Summary: RIGHT: - There is no evidence of deep vein thrombosis in the lower extremity. However, portions of this examination were limited- see technologist comments above.  - No cystic structure found in the popliteal fossa.  LEFT: - No evidence of common femoral vein obstruction.  *See table(s) above for measurements and observations. Electronically signed by Monica Martinez MD on 11/12/2020 at 5:17:16 PM.    Final     Anti-infectives: Anti-infectives (From admission,  onward)   Start     Dose/Rate Route Frequency Ordered Stop   11/13/20 2200  meropenem (MERREM) 500 mg in sodium chloride 0.9 % 100 mL IVPB        500 mg 200 mL/hr over 30 Minutes Intravenous Every 24 hours 11/13/20 0916     11/12/20 2200  meropenem (MERREM) 1 g in sodium chloride 0.9 % 100 mL IVPB  Status:  Discontinued        1 g 200 mL/hr over 30 Minutes Intravenous Every 24 hours 11/12/20 0742 11/13/20 0916   11/10/20 2300  meropenem (MERREM) 1 g in sodium chloride 0.9 % 100 mL IVPB  Status:  Discontinued        1 g 200 mL/hr over 30 Minutes Intravenous Every 12 hours 11/10/20 2207 11/12/20 0742   11/08/20 1600  ceFEPIme (MAXIPIME) 2 g in sodium chloride 0.9 % 100 mL IVPB  Status:  Discontinued        2 g 200 mL/hr over 30 Minutes Intravenous Every 12 hours 11/08/20 1456 11/10/20 2207   11/08/20 1500  metroNIDAZOLE (FLAGYL) IVPB 500 mg  Status:  Discontinued        500 mg 100 mL/hr over 60 Minutes Intravenous Every 8 hours 11/08/20 1404 11/10/20 2213       Assessment/Plan E coli bacteremia ARF - on HD DM A fib HTN Diffuse body pain  Acute cholecystitis -s/p perc chole drain by IR -LFTs now starting to down trend. -perc drain will  need to remain in place 6-8 weeks at least. -patient overall looks poor.  No clear persistent source that has not been addressed at this point.  WBC is stable and downtrending overall.  Doubt a noncontrasted CT scan is going to provide much more information to clarify his situation overall. -discussed with Dr. Lamonte Sakai of [facility]. -agree his overall prognosis is diminished at this time.  FEN - as per primary as patient is awake VTE - heparin ID - merrem   LOS: 5 days    Henreitta Cea , [facility] Surgery 11/13/2020, 9:17 AM Please see Amion for pager number during day hours 7:00am-4:30pm or 7:00am -11:30am on weekends

## 2020-11-13 NOTE — [place] (Signed)
Referring Physician(s): CCM Dr Lamonte Sakai  Supervising Physician: Corrie Mckusick  Patient Status:  [name] - In-pt  Chief Complaint:  Sepsis  Acalculous cholecystitis  Subjective:  Perc chole drain placed in IR 5/20 Family at bedside Tries to communicate Can follow me with his eyes   Allergies: Penicillamine and Penicillins  Medications: Prior to Admission medications   Medication Sig Start Date End Date Taking? Authorizing Provider  amLODipine (NORVASC) 10 MG tablet Take 10 mg by mouth every morning. 10/18/20  Yes [provider]  aspirin EC 81 MG tablet Take 162 mg by mouth daily. Swallow whole.   Yes [provider]  furosemide (LASIX) 20 MG tablet Take 40 mg by mouth daily. 09/18/20  Yes [provider]  hydrOXYzine (ATARAX/VISTARIL) 10 MG tablet Take 10-20 mg by mouth at bedtime. 10/29/20  Yes [provider]  irbesartan (AVAPRO) 150 MG tablet Take 150 mg by mouth daily. 08/30/20  Yes [provider]  metFORMIN (GLUCOPHAGE) 500 MG tablet Take 1,000 mg by mouth 2 (two) times daily. 09/14/20  Yes [provider]  metoprolol succinate (TOPROL-XL) 50 MG 24 hr tablet Take 50 mg by mouth every morning. 08/11/20  Yes [provider]  NONFORMULARY OR COMPOUNDED ITEM Apply 1 application topically 2 (two) times daily. TAC 0.1% cream + Hydrophor c&m 0.5% No more than a week at a time 08/13/20  Yes [provider]  rosuvastatin (CRESTOR) 10 MG tablet Take 10 mg by mouth at bedtime. 09/06/20  Yes [provider]     Vital Signs: BP 101/60   Pulse 100   Temp 98.3 F (36.8 C) (Axillary)   Resp 13   Ht '5\' 10"'$  (1.778 m)   Wt 245 lb 9.5 oz (111.4 kg)   SpO2 98%   BMI 35.24 kg/m   Physical Exam Skin:    General: Skin is warm.     Comments: Site of drain is c/d/i No bleeding OP bloody bile Flushes easily per RN OP 355 cc yesterday 50 cc in bag now  Report Status 11/12/2020 FINAL  Organism ID, Bacteria  ESCHERICHIA COLI        Imaging: DG Chest 1 View  Result Date: 11/12/2020 Mike Craze, DO     11/12/2020 12:16 PM Central Venous Catheter Insertion Procedure Note Tom Scott [id number] 05/18/1956 Procedure: Insertion of Non-tunneled HD Catheter with US guidance Indications: HD Procedure Details Consent: Risks of procedure as well as the alternatives and risks of each were explained to the (patient/caregiver).  Consent for procedure obtained. Time Out: Verified patient identification, verified procedure, site/side was marked, verified correct patient position, special equipment/implants available, medications/allergies/relevent history reviewed, required imaging and test results available.  Performed Maximum sterile technique was used including antiseptics, cap, gloves, gown, hand hygiene, mask and sheet. Skin prep: Chlorhexidine; local anesthetic administered A antimicrobial bonded/coated triple lumen catheter was placed in the left internal jugular vein using the Seldinger technique. Catheter placed to 15 cm. Blood aspirated via all 3 ports and then flushed x 3. Line sutured x 2 and dressing applied. Ultrasound guidance used.Yes.  Evaluation Blood flow good Complications: No apparent complications Patient did tolerate procedure well. Chest X-ray ordered to verify placement.  CXR: pending. Harlow Ohms, DO PGY-2 IMTS   IR Perc Cholecystostomy  Result Date: 11/09/2020 INDICATION: Acute calculus cholecystitis EXAM: ULTRASOUND FLUOROSCOPIC PERCUTANEOUS TRANSHEPATIC CHOLECYSTOSTOMY MEDICATIONS: Patient is already receiving IV antibiotics as an inpatient; The antibiotic was administered within an appropriate time frame prior to the initiation of  the procedure. ANESTHESIA/SEDATION: Moderate (conscious) sedation was employed during this procedure. A total of Versed 1.0 mg and Fentanyl 50 mcg was administered intravenously. Moderate Sedation Time: 10 minutes. The patient's level of consciousness and vital  signs were monitored continuously by radiology nursing throughout the procedure under my direct supervision. FLUOROSCOPY TIME:  Fluoroscopy Time: 0 minutes 30 seconds (6 mGy). COMPLICATIONS: None immediate. PROCEDURE: Informed written consent was obtained from the patient after a thorough discussion of the procedural risks, benefits and alternatives. All questions were addressed. Maximal Sterile Barrier Technique was utilized including caps, mask, sterile gowns, sterile gloves, sterile drape, hand hygiene and skin antiseptic. A timeout was performed prior to the initiation of the procedure. Previous imaging reviewed. Preliminary ultrasound performed in the right upper quadrant. The thick-walled edematous gallbladder was localized and marked for a transhepatic approach below the right subcostal margin. Under sterile conditions and local anesthesia, a 21 gauge needle was advanced percutaneously via transhepatic approach into the gallbladder. Needle position confirmed with ultrasound. Images obtained for documentation. Bile sample obtained and sent for culture. Guidewire inserted followed by Accustick dilator set. Amplatz guidewire inserted followed by tract dilatation insert a 10 French drain. Drain catheter position confirmed with ultrasound and fluoroscopy. Images obtained for documentation. Catheter secured with a Prolene suture and connected to external gravity drainage bag. Sterile dressing applied. No immediate complication. Patient tolerated the procedure well. IMPRESSION: Successful ultrasound and fluoroscopic 10 French percutaneous transhepatic cholecystostomy. Electronically Signed   By: Jerilynn Mages.  Shick M.D.   On: 11/09/2020 13:17   DG CHEST PORT 1 VIEW  Result Date: 11/12/2020 CLINICAL DATA:  Central line placement EXAM: PORTABLE CHEST 1 VIEW COMPARISON:  Portable exam 1412 hours compared to 11/08/2020 FINDINGS: Indwelling RIGHT jugular central venous catheter with tip projecting over cavoatrial junction. New  LEFT subclavian central venous catheter with tip projecting over LEFT brachiocephalic vein. Enlargement of cardiac silhouette with pulmonary vascular congestion. Atherosclerotic calcification aorta. Bibasilar atelectasis and question minimal perihilar edema. No definite pleural effusion or pneumothorax. IMPRESSION: Enlargement of cardiac silhouette with pulmonary vascular congestion and question minimal perihilar edema. Bibasilar atelectasis. No pneumothorax following LEFT jugular line placement. Electronically Signed   By: Lavonia Dana M.D.   On: 11/12/2020 14:42   VAS [place] LOWER EXTREMITY VENOUS (DVT)  Result Date: 11/12/2020  Lower Venous DVT Study Patient Name:  Tom Scott  Date of Exam:   11/12/2020 Medical Rec #: [id number]      Accession #:    [id number] Date of Birth: [birth year]      Patient Gender: M Patient Age:   65Y Exam Location:  [facility] Procedure:      VAS [place] LOWER EXTREMITY VENOUS (DVT) Referring Phys: [place] --------------------------------------------------------------------------------  Indications: Swelling, and Pain.  Risk Factors: None identified. Limitations: Body habitus, poor ultrasound/tissue interface and patient movement, patient pain tolerance, patient guarding. Comparison Study: No prior studies. Performing Technologist: Oliver Hum RVT  Examination Guidelines: A complete evaluation includes B-mode imaging, spectral Doppler, color Doppler, and power Doppler as needed of all accessible portions of each vessel. Bilateral testing is considered an integral part of a complete examination. Limited examinations for reoccurring indications may be performed as noted. The reflux portion of the exam is performed with the patient in reverse Trendelenburg.  +---------+---------------+---------+-----------+----------+-------------------+ RIGHT    CompressibilityPhasicitySpontaneityPropertiesThrombus Aging       +---------+---------------+---------+-----------+----------+-------------------+ CFV      Full           Yes      Yes                                      +---------+---------------+---------+-----------+----------+-------------------+  SFJ      Full                                                             +---------+---------------+---------+-----------+----------+-------------------+ FV Prox  Full                                                             +---------+---------------+---------+-----------+----------+-------------------+ FV Mid                  Yes      Yes                                      +---------+---------------+---------+-----------+----------+-------------------+ FV Distal               Yes      Yes                                      +---------+---------------+---------+-----------+----------+-------------------+ PFV                                                   Not well visualized +---------+---------------+---------+-----------+----------+-------------------+ POP                     Yes      Yes                                      +---------+---------------+---------+-----------+----------+-------------------+ PTV      Full                                                             +---------+---------------+---------+-----------+----------+-------------------+ PERO     Full                                                             +---------+---------------+---------+-----------+----------+-------------------+   +----+---------------+---------+-----------+----------+--------------+ LEFTCompressibilityPhasicitySpontaneityPropertiesThrombus Aging +----+---------------+---------+-----------+----------+--------------+ CFV Full           Yes      Yes                                 +----+---------------+---------+-----------+----------+--------------+    Summary: RIGHT: - There is no evidence of deep  vein thrombosis in the lower extremity. However, portions of this examination were limited- see technologist  comments above.  - No cystic structure found in the popliteal fossa.  LEFT: - No evidence of common femoral vein obstruction.  *See table(s) above for measurements and observations. Electronically signed by Monica Martinez MD on 11/12/2020 at 5:17:16 PM.    Final     Labs:  CBC: Recent Labs    11/10/20 [id number] 11/11/20 0418 11/11/20 1755 11/12/20 0334 11/13/20 0338  WBC 21.0* 18.6*  --  16.3* 17.2*  HGB 10.2* 9.5* 10.2* 9.1* 10.0*  HCT 31.7* 29.4* 30.0* 27.7* 28.8*  PLT 92* 71*  --  71* 70*    COAGS: Recent Labs    11/08/20 1426  INR 1.4*    BMP: Recent Labs    11/11/20 0835 11/11/20 1600 11/11/20 1755 11/12/20 0334 11/13/20 0657  NA 135 133* 137 133* 137  K 4.2 4.0 3.9 3.9 3.9  CL 110 107  --  107 105  CO2 13* 13*  --  13* 17*  GLUCOSE 123* 119*  --  112* 112*  BUN 61* 63*  --  69* 54*  CALCIUM 7.6* 7.7*  --  7.8* 8.3*  CREATININE 4.64* 4.89*  --  5.50* 5.07*  GFRNONAA 13* 12*  --  11* 12*    LIVER FUNCTION TESTS: Recent Labs    11/10/20 0213 11/11/20 0835 11/12/20 0927 11/13/20 0657  BILITOT 3.5* 3.8* 2.9* 2.6*  AST 125* 64* 45* 31  ALT 199* 117* 89* 70*  ALKPHOS 125 173* 170* 177*  PROT 5.7* 5.1* 5.3* 5.6*  ALBUMIN 2.6* 2.1* 2.0* 2.0*    Assessment and Plan:  Perc chole in place OP good This drain will stay in place 6-8 weeks- unless to OR Will follow  Electronically Signed: Lavonia Drafts, PA-C 11/13/2020, 7:44 AM   I spent a total of 15 Minutes at the the patient's bedside AND on the patient's hospital floor or unit, greater than 50% of which was counseling/coordinating care for perc chole drain

## 2020-11-13 NOTE — [place] Notes (Signed)
Pharmacy Antibiotic Note  Delance Vanderzwaag is a 65 y.o. male transferred to [facility] on 11/08/2020 with cholelithiasis.  Pharmacy has been consulted for meropenem (gall bladder cultures from OSH show ESBL)  The patient's AKI has worsened with SCr 5 and minimal UOP in the past 24 hours (107 cc charted). Patient had HD 5/23 PM for 2.5 hrs. Plan to repeat HD again today.    Plan: - Reduce Meropenem to '500mg'$  IV every 24 hours - Will continue to follow renal function, HD plans,and LOT  Height: '5\' 10"'$  (177.8 cm) Weight: 111.4 kg (245 lb 9.5 oz) IBW/kg (Calculated) : 73  Temp (24hrs), Avg:98 F (36.7 C), Min:97.6 F (36.4 C), Max:98.3 F (36.8 C)  Recent Labs  Lab 11/08/20 2035 11/09/20 0105 11/09/20 0358 11/10/20 0213 11/10/20 2140 11/11/20 0418 11/11/20 0835 11/11/20 1600 11/12/20 0334 11/13/20 0338 11/13/20 0657  WBC  --   --  22.5* 21.0*  --  18.6*  --   --  16.3* 17.2*  --   CREATININE  --   --  2.38* 3.38*  --   --  4.64* 4.89* 5.50*  --  5.07*  LATICACIDVEN 4.8* 4.5*  --   --  0.8  --   --   --   --   --   --     Estimated Creatinine Clearance: 17.7 mL/min (A) (by C-G formula based on SCr of 5.07 mg/dL (H)).    Allergies  Allergen Reactions  . Penicillamine     Other reaction(s): lips swell  . Penicillins     Other reaction(s): Swelling  Legacy System: CCA Onset Date: <blank> Substance Legacy/Cerner: penicillins / penicillins (Legacy value) Category: Drug Severity Legacy/Cerner: <blank> / Unknown Reaction(s): swelling Comments: <blank>  Legacy System: CCA Onset Date: <blank> Substance Legacy/Cerner: ampicillin / ampicillin (Legacy value) Category: Drug Severity Legacy/Cerner: <blank> / Unknown Reaction(s): swelling Comments: <blank>  Legacy System: CCA Onset Date: <blank> Substance Legacy/Cerner: ampicillin / ampicillin (Legacy value) Category: Drug Severity Legacy/Cerner: <blank> / Unknown Reaction(s): swelling Comments: <blank>  Legacy System:  CCA Onset Date: <blank> Substance Legacy/Cerner: penicillins / penicillins (Legacy value) Category: Drug Severity Legacy/Cerner: <blank> / Unknown Reaction(s): swelling Comments: <blank>     LVQ x1 PTA  Flagyl PTA 5/19 >> 5/21 Cefepime 5/19 >> 5/21 Merrem 5/21>>  5/19 MRSA PCR - negative 5/20 BCx >> ngtd 5/20 Gall bladder abscess >>ESBL Ecoli , rare Bacteroides fragilis  Culture from [place]: 5/19 BCx - 3/4 E.coli (ESBL per chart notes) and 2/4 Bacteroides fragilis, preliminary  Thank you for allowing pharmacy to be a part of this patient's care.  Sloan Leiter, PharmD, BCPS, BCCCP Clinical Pharmacist Please refer to [facility] for [place] numbers 11/13/2020 9:14 AM   **Pharmacist phone directory can now be found on [email or web address] ([email or web address]).  Listed under [place].

## 2020-11-14 ENCOUNTER — Inpatient Hospital Stay (HOSPITAL_COMMUNITY): Payer: Medicare (Managed Care)

## 2020-11-14 DIAGNOSIS — Z452 Encounter for adjustment and management of vascular access device: Secondary | ICD-10-CM

## 2020-11-14 DIAGNOSIS — R6521 Severe sepsis with septic shock: Secondary | ICD-10-CM | POA: Diagnosis not present

## 2020-11-14 DIAGNOSIS — N179 Acute kidney failure, unspecified: Secondary | ICD-10-CM | POA: Diagnosis not present

## 2020-11-14 DIAGNOSIS — A419 Sepsis, unspecified organism: Secondary | ICD-10-CM | POA: Diagnosis not present

## 2020-11-14 DIAGNOSIS — E43 Unspecified severe protein-calorie malnutrition: Secondary | ICD-10-CM | POA: Insufficient documentation

## 2020-11-14 LAB — CBC
HCT: 24.3 % — ABNORMAL LOW (ref 39.0–52.0)
Hemoglobin: 8.4 g/dL — ABNORMAL LOW (ref 13.0–17.0)
MCH: 26.4 pg (ref 26.0–34.0)
MCHC: 34.6 g/dL (ref 30.0–36.0)
MCV: 76.4 fL — ABNORMAL LOW (ref 80.0–100.0)
Platelets: 73 10*3/uL — ABNORMAL LOW (ref 150–400)
RBC: 3.18 MIL/uL — ABNORMAL LOW (ref 4.22–5.81)
RDW: 18 % — ABNORMAL HIGH (ref 11.5–15.5)
WBC: 17.5 10*3/uL — ABNORMAL HIGH (ref 4.0–10.5)
nRBC: 0 % (ref 0.0–0.2)

## 2020-11-14 LAB — GLUCOSE, CAPILLARY
Glucose-Capillary: 119 mg/dL — ABNORMAL HIGH (ref 70–99)
Glucose-Capillary: 127 mg/dL — ABNORMAL HIGH (ref 70–99)
Glucose-Capillary: 135 mg/dL — ABNORMAL HIGH (ref 70–99)
Glucose-Capillary: 165 mg/dL — ABNORMAL HIGH (ref 70–99)
Glucose-Capillary: 173 mg/dL — ABNORMAL HIGH (ref 70–99)
Glucose-Capillary: 186 mg/dL — ABNORMAL HIGH (ref 70–99)

## 2020-11-14 LAB — CULTURE, BLOOD (ROUTINE X 2)
Culture: NO GROWTH
Culture: NO GROWTH
Special Requests: ADEQUATE
Special Requests: ADEQUATE

## 2020-11-14 LAB — RENAL FUNCTION PANEL
Albumin: 1.8 g/dL — ABNORMAL LOW (ref 3.5–5.0)
Anion gap: 18 — ABNORMAL HIGH (ref 5–15)
BUN: 51 mg/dL — ABNORMAL HIGH (ref 8–23)
CO2: 16 mmol/L — ABNORMAL LOW (ref 22–32)
Calcium: 8 mg/dL — ABNORMAL LOW (ref 8.9–10.3)
Chloride: 102 mmol/L (ref 98–111)
Creatinine, Ser: 5.06 mg/dL — ABNORMAL HIGH (ref 0.61–1.24)
GFR, Estimated: 12 mL/min — ABNORMAL LOW (ref >60)
Glucose, Bld: 123 mg/dL — ABNORMAL HIGH (ref 70–99)
Phosphorus: 3.5 mg/dL (ref 2.5–4.6)
Potassium: 3.5 mmol/L (ref 3.5–5.1)
Sodium: 136 mmol/L (ref 135–145)

## 2020-11-14 LAB — HEPARIN LEVEL (UNFRACTIONATED)
Heparin Unfractionated: 0.27 IU/mL — ABNORMAL LOW (ref 0.30–0.70)
Heparin Unfractionated: 0.35 IU/mL (ref 0.30–0.70)
Heparin Unfractionated: 0.32 [IU]/mL (ref 0.30–0.70)

## 2020-11-14 LAB — MAGNESIUM: Magnesium: 2 mg/dL (ref 1.7–2.4)

## 2020-11-14 LAB — ZINC: Zinc: 37 ug/dL — ABNORMAL LOW (ref 44–115)

## 2020-11-14 MED ORDER — CHLORHEXIDINE GLUCONATE CLOTH 2 % EX PADS: 6.0000 | MEDICATED_PAD | Freq: Every day | CUTANEOUS | Status: DC

## 2020-11-14 MED ORDER — ADULT MULTIVITAMIN W/MINERALS CH
1.0000 | ORAL_TABLET | Freq: Two times a day (BID) | ORAL | Status: DC
  Administered 2020-11-14 – 2020-11-18 (×8): 1
  Filled 2020-11-14 (×9): qty 1

## 2020-11-14 MED ORDER — VITAMIN D 25 MCG (1000 UNIT) PO TABS
1000.0000 [IU] | ORAL_TABLET | Freq: Every day | ORAL | Status: DC
  Administered 2020-11-14 – 2020-11-18 (×5): 1000 [IU]
  Filled 2020-11-14 (×5): qty 1

## 2020-11-14 MED ORDER — CALCIUM CARBONATE ANTACID 500 MG PO CHEW
1.0000 | CHEWABLE_TABLET | Freq: Three times a day (TID) | ORAL | Status: DC
  Administered 2020-11-14 – 2020-11-18 (×11): 200 mg
  Filled 2020-11-14 (×12): qty 1

## 2020-11-14 MED ORDER — POLYETHYLENE GLYCOL 3350 17 G PO PACK: 17.0000 g | PACK | Freq: Every day | ORAL | Status: DC | PRN

## 2020-11-14 MED ORDER — DOCUSATE SODIUM 50 MG/5ML PO LIQD: 100.0000 mg | Freq: Two times a day (BID) | ORAL | Status: DC | PRN

## 2020-11-14 MED ORDER — VITAL 1.5 CAL PO LIQD
1000.0000 mL | ORAL | Status: DC
  Administered 2020-11-14 – 2020-11-17 (×5): 1000 mL
  Filled 2020-11-14 (×3): qty 1000

## 2020-11-14 MED ORDER — PROSOURCE TF PO LIQD
45.0000 mL | Freq: Three times a day (TID) | ORAL | Status: DC
  Administered 2020-11-14 – 2020-11-17 (×10): 45 mL
  Filled 2020-11-14 (×13): qty 45

## 2020-11-14 NOTE — [place] Notes (Signed)
Subjective: Slightly better today.  More alert as he recognizes his family and interacted more with them this morning than previous days.  Required less pain meds overnight  ROS: unable, still too lethargic  Objective: Vital signs in last 24 hours: Temp:  [97.4 F (36.3 C)-101.1 F (38.4 C)] 99.7 F (37.6 C) (05/25 0803) Pulse Rate:  [85-119] 103 (05/25 0830) Resp:  [14-31] 26 (05/25 0830) BP: (77-124)/(42-100) 96/52 (05/25 0830) SpO2:  [91 %-100 %] 92 % (05/25 0830) Weight:  [110.6 kg-114 kg] 110.6 kg (05/25 0213) Last BM Date: 11/13/20  Intake/Output from previous day: 05/24 0701 - 05/25 0700 In: 1161.8 [I.V.:1057; IV Piggyback:99.9] Out: 337 [Urine:37; Drains:300] Intake/Output this shift: Total I/O In: 45.7 [I.V.:45.7] Out: 10 [Urine:10]  PE: Abd: soft, diffusely tender, but to light touch, deep touch, hypersensitive to touch, but mildly improved from yesterday.  Perc chole drain in place with bilious output present.  +bs Skin: hypersensitive to touch everywhere all over his body  Lab Results:  Recent Labs    11/13/20 0338 11/14/20 0350  WBC 17.2* 17.5*  HGB 10.0* 8.4*  HCT 28.8* 24.3*  PLT 70* 73*   BMET Recent Labs    11/13/20 0657 11/14/20 0652  NA 137 136  K 3.9 3.5  CL 105 102  CO2 17* 16*  GLUCOSE 112* 123*  BUN 54* 51*  CREATININE 5.07* 5.06*  CALCIUM 8.3* 8.0*   PT/INR No results for input(s): LABPROT, INR in the last 72 hours. CMP     Component Value Date/Time   NA 136 11/14/2020 0652   K 3.5 11/14/2020 0652   CL 102 11/14/2020 0652   CO2 16 (L) 11/14/2020 0652   GLUCOSE 123 (H) 11/14/2020 0652   BUN 51 (H) 11/14/2020 0652   CREATININE 5.06 (H) 11/14/2020 0652   CALCIUM 8.0 (L) 11/14/2020 0652   PROT 5.6 (L) 11/13/2020 0657   ALBUMIN 1.8 (L) 11/14/2020 0652   AST 31 11/13/2020 0657   ALT 70 (H) 11/13/2020 0657   ALKPHOS 177 (H) 11/13/2020 0657   BILITOT 2.6 (H) 11/13/2020 0657   GFRNONAA 12 (L) 11/14/2020 0652   Lipase      Component Value Date/Time   LIPASE 27 11/09/2020 0358       Studies/Results: DG Chest 1 View  Result Date: 11/12/2020 Mike Craze, DO     11/12/2020 12:16 PM Central Venous Catheter Insertion Procedure Note Leilan Caleb [id number] 24-Jun-1955 Procedure: Insertion of Non-tunneled HD Catheter with US guidance Indications: HD Procedure Details Consent: Risks of procedure as well as the alternatives and risks of each were explained to the (patient/caregiver).  Consent for procedure obtained. Time Out: Verified patient identification, verified procedure, site/side was marked, verified correct patient position, special equipment/implants available, medications/allergies/relevent history reviewed, required imaging and test results available.  Performed Maximum sterile technique was used including antiseptics, cap, gloves, gown, hand hygiene, mask and sheet. Skin prep: Chlorhexidine; local anesthetic administered A antimicrobial bonded/coated triple lumen catheter was placed in the left internal jugular vein using the Seldinger technique. Catheter placed to 15 cm. Blood aspirated via all 3 ports and then flushed x 3. Line sutured x 2 and dressing applied. Ultrasound guidance used.Yes.  Evaluation Blood flow good Complications: No apparent complications Patient did tolerate procedure well. Chest X-ray ordered to verify placement.  CXR: pending. Harlow Ohms, DO PGY-2 IMTS   DG CHEST PORT 1 VIEW  Result Date: 11/12/2020 CLINICAL DATA:  Central line placement EXAM: PORTABLE CHEST 1  VIEW COMPARISON:  Portable exam 1412 hours compared to 11/08/2020 FINDINGS: Indwelling RIGHT jugular central venous catheter with tip projecting over cavoatrial junction. New LEFT subclavian central venous catheter with tip projecting over LEFT brachiocephalic vein. Enlargement of cardiac silhouette with pulmonary vascular congestion. Atherosclerotic calcification aorta. Bibasilar atelectasis and question minimal perihilar  edema. No definite pleural effusion or pneumothorax. IMPRESSION: Enlargement of cardiac silhouette with pulmonary vascular congestion and question minimal perihilar edema. Bibasilar atelectasis. No pneumothorax following LEFT jugular line placement. Electronically Signed   By: Lavonia Dana M.D.   On: 11/12/2020 14:42   VAS [place] LOWER EXTREMITY VENOUS (DVT)  Result Date: 11/12/2020  Lower Venous DVT Study Patient Name:  AYHAN HEMP  Date of Exam:   11/12/2020 Medical Rec #: [id number]      Accession #:    [id number] Date of Birth: 1955/07/01      Patient Gender: M Patient Age:   65Y Exam Location:  [facility] Procedure:      VAS [place] LOWER EXTREMITY VENOUS (DVT) Referring Phys: [place] --------------------------------------------------------------------------------  Indications: Swelling, and Pain.  Risk Factors: None identified. Limitations: Body habitus, poor ultrasound/tissue interface and patient movement, patient pain tolerance, patient guarding. Comparison Study: No prior studies. Performing Technologist: Oliver Hum RVT  Examination Guidelines: A complete evaluation includes B-mode imaging, spectral Doppler, color Doppler, and power Doppler as needed of all accessible portions of each vessel. Bilateral testing is considered an integral part of a complete examination. Limited examinations for reoccurring indications may be performed as noted. The reflux portion of the exam is performed with the patient in reverse Trendelenburg.  +---------+---------------+---------+-----------+----------+-------------------+ RIGHT    CompressibilityPhasicitySpontaneityPropertiesThrombus Aging      +---------+---------------+---------+-----------+----------+-------------------+ CFV      Full           Yes      Yes                                      +---------+---------------+---------+-----------+----------+-------------------+ SFJ      Full                                                              +---------+---------------+---------+-----------+----------+-------------------+ FV Prox  Full                                                             +---------+---------------+---------+-----------+----------+-------------------+ FV Mid                  Yes      Yes                                      +---------+---------------+---------+-----------+----------+-------------------+ FV Distal               Yes      Yes                                      +---------+---------------+---------+-----------+----------+-------------------+  PFV                                                   Not well visualized +---------+---------------+---------+-----------+----------+-------------------+ POP                     Yes      Yes                                      +---------+---------------+---------+-----------+----------+-------------------+ PTV      Full                                                             +---------+---------------+---------+-----------+----------+-------------------+ PERO     Full                                                             +---------+---------------+---------+-----------+----------+-------------------+   +----+---------------+---------+-----------+----------+--------------+ LEFTCompressibilityPhasicitySpontaneityPropertiesThrombus Aging +----+---------------+---------+-----------+----------+--------------+ CFV Full           Yes      Yes                                 +----+---------------+---------+-----------+----------+--------------+    Summary: RIGHT: - There is no evidence of deep vein thrombosis in the lower extremity. However, portions of this examination were limited- see technologist comments above.  - No cystic structure found in the popliteal fossa.  LEFT: - No evidence of common femoral vein obstruction.  *See table(s) above for measurements and observations. Electronically  signed by Monica Martinez MD on 11/12/2020 at 5:17:16 PM.    Final     Anti-infectives: Anti-infectives (From admission, onward)   Start     Dose/Rate Route Frequency Ordered Stop   11/13/20 2200  meropenem (MERREM) 500 mg in sodium chloride 0.9 % 100 mL IVPB        500 mg 200 mL/hr over 30 Minutes Intravenous Every 24 hours 11/13/20 0916     11/12/20 2200  meropenem (MERREM) 1 g in sodium chloride 0.9 % 100 mL IVPB  Status:  Discontinued        1 g 200 mL/hr over 30 Minutes Intravenous Every 24 hours 11/12/20 0742 11/13/20 0916   11/10/20 2300  meropenem (MERREM) 1 g in sodium chloride 0.9 % 100 mL IVPB  Status:  Discontinued        1 g 200 mL/hr over 30 Minutes Intravenous Every 12 hours 11/10/20 2207 11/12/20 0742   11/08/20 1600  ceFEPIme (MAXIPIME) 2 g in sodium chloride 0.9 % 100 mL IVPB  Status:  Discontinued        2 g 200 mL/hr over 30 Minutes Intravenous Every 12 hours 11/08/20 1456 11/10/20 2207   11/08/20 1500  metroNIDAZOLE (FLAGYL) IVPB 500 mg  Status:  Discontinued        500 mg 100  mL/hr over 60 Minutes Intravenous Every 8 hours 11/08/20 1404 11/10/20 2213       Assessment/Plan E coli bacteremia ARF - on HD DM A fib HTN Thrombocytopenia  Diffuse body pain  Acute cholecystitis -s/p perc chole drain by IR -LFTs now starting to down trend. -perc drain will need to remain in place 6-8 weeks at least. -patient overall looks poor.  No clear persistent source that has not been addressed at this point.  WBC is stable and downtrending overall.  Doubt a noncontrasted CT scan is going to provide much more information to clarify his situation overall. -slightly improved today. -continue drain and will follow   FEN - as per primary as patient is awake VTE - heparin ID - merrem   LOS: 6 days    Henreitta Cea , [facility] 11/14/2020, 9:07 AM Please see Amion for pager number during day hours 7:00am-4:30pm or 7:00am -11:30am on weekends

## 2020-11-14 NOTE — Procedures (Signed)
Cortrak  Person Inserting Tube:  Esaw Dace, RD Tube Type:  Cortrak - 43 inches Tube Location:  Right nare Initial Placement:  Postpyloric Secured by: Bridle Technique Used to Measure Tube Placement:  Documented cm marking at nare/ corner of mouth Cortrak Secured At:  77 cm    Cortrak Tube Team Note:  Consult received to place a Cortrak feeding tube.   Received page regarding Cortrak being clogged, unable to flush. Cortrak respositioned and able to flush.   X-ray is required, abdominal x-ray has been ordered by the Cortrak team. Please confirm tube placement before using the Cortrak tube.   If the tube becomes dislodged please keep the tube and contact the Cortrak team at [email or web address] (password [email or web address]) for replacement.  If after hours and replacement cannot be delayed, place a NG tube and confirm placement with an abdominal x-ray.    Kerman Passey MS, RDN, LDN, CNSC Registered Dietitian III Clinical Nutrition RD Pager and On-Call Pager Number Located in [place]

## 2020-11-14 NOTE — Progress Notes (Addendum)
Daily Rounding Note  11/14/2020, 10:17 AM  LOS: 6 days   SUBJECTIVE:   Chief complaint:  Elevated LFTS.  Perc cholecystostomy for cholecystitis.    coretrak being placed per RD recommendation.   Po intake variable due to wax/wane mental statusand not meeting nutritional  Needs.    OBJECTIVE:         Vital signs in last 24 hours:    Temp:  [97.4 F (36.3 C)-101.1 F (38.4 C)] 99.7 F (37.6 C) (05/25 0803) Pulse Rate:  [85-119] 103 (05/25 0830) Resp:  [14-31] 26 (05/25 0830) BP: (77-124)/(42-100) 96/52 (05/25 0830) SpO2:  [91 %-100 %] 92 % (05/25 0830) Weight:  [110.6 kg-114 kg] 110.6 kg (05/25 0213) Last BM Date: 11/13/20 Filed Weights   11/13/20 1400 11/13/20 1635 11/14/20 0213  Weight: 113.3 kg 114 kg 110.6 kg   coretrak getting placed, did not see pt.    Intake/Output from previous day: 05/24 0701 - 05/25 0700 In: 1161.8 [I.V.:1057; IV Piggyback:99.9] Out: 337 [Urine:37; Drains:300]  Intake/Output this shift: Total I/O In: 45.7 [I.V.:45.7] Out: 10 [Urine:10]  Lab Results: Recent Labs    11/12/20 0334 11/13/20 0338 11/14/20 0350  WBC 16.3* 17.2* 17.5*  HGB 9.1* 10.0* 8.4*  HCT 27.7* 28.8* 24.3*  PLT 71* 70* 73*   BMET Recent Labs    11/12/20 0334 11/13/20 0657 11/14/20 0652  NA 133* 137 136  K 3.9 3.9 3.5  CL 107 105 102  CO2 13* 17* 16*  GLUCOSE 112* 112* 123*  BUN 69* 54* 51*  CREATININE 5.50* 5.07* 5.06*  CALCIUM 7.8* 8.3* 8.0*   LFT Recent Labs    11/12/20 0927 11/13/20 0657 11/14/20 0652  PROT 5.3* 5.6*  --   ALBUMIN 2.0* 2.0* 1.8*  AST 45* 31  --   ALT 89* 70*  --   ALKPHOS 170* 177*  --   BILITOT 2.9* 2.6*  --   BILIDIR 1.1*  --   --   IBILI 1.8*  --   --    PT/INR No results for input(s): LABPROT, INR in the last 72 hours. Hepatitis Panel Recent Labs    11/12/20 1827  HEPBSAG NON REACTIVE    Studies/Results:  DG CHEST PORT 1 VIEW  Result Date:  11/12/2020 CLINICAL DATA:  Central line placement EXAM: PORTABLE CHEST 1 VIEW COMPARISON:  Portable exam 1412 hours compared to 11/08/2020 FINDINGS: Indwelling RIGHT jugular central venous catheter with tip projecting over cavoatrial junction. New LEFT subclavian central venous catheter with tip projecting over LEFT brachiocephalic vein. Enlargement of cardiac silhouette with pulmonary vascular congestion. Atherosclerotic calcification aorta. Bibasilar atelectasis and question minimal perihilar edema. No definite pleural effusion or pneumothorax. IMPRESSION: Enlargement of cardiac silhouette with pulmonary vascular congestion and question minimal perihilar edema. Bibasilar atelectasis. No pneumothorax following LEFT jugular line placement. Electronically Signed   By: Lavonia Dana M.D.   On: 11/12/2020 14:42   VAS [place] LOWER EXTREMITY VENOUS (DVT)  Result Date: 11/12/2020 Summary: RIGHT: - There is no evidence of deep vein thrombosis in the lower extremity. However, portions of this examination were limited- see technologist comments above.  - No cystic structure found in the popliteal fossa.  LEFT: - No evidence of common femoral vein obstruction.  *See table(s) above for measurements and observations. Electronically signed by Monica Martinez MD on 11/12/2020 at 5:17:16 PM.    Final    Scheduled Meds: . chlorhexidine  15 mL Mouth Rinse BID  . Chlorhexidine  Gluconate Cloth  6 each Topical [id number]  . insulin aspart  0-15 Units Subcutaneous Q4H  . mouth rinse  15 mL Mouth Rinse q12n4p  . pantoprazole (PROTONIX) IV  40 mg Intravenous Q24H  . sodium chloride flush  10-40 mL Intracatheter Q12H  . sodium chloride flush  5 mL Intracatheter Q8H   Continuous Infusions: . sodium chloride    . amiodarone 30 mg/hr (11/14/20 0800)  . heparin 2,900 Units/hr (11/14/20 0800)  . meropenem [facility]) IV Stopped (11/13/20 2217)   PRN Meds:.docusate sodium, fentaNYL (SUBLIMAZE) injection, heparin, ondansetron (ZOFRAN) IV,  polyethylene glycol, sodium chloride flush   ASSESMENT:   *   Acute cholecystitis w sepsis, E coli in bile.   11/09/20 perc biliary tube.  No [place] of CT evidence of choledocholithiasis.   LFTs overall improved x for alk phos (setting of ARF) WBCs remain in 17s.   E colli growing in GB aspirate.   Meropenem in place.    *   AKI.  ARF.  Receiving HD.    *   Microcytic anemia  *   Thrombocytopenia, new in setting of acute illness, sepsis.      *   Severe, acute malnutrition.  NPO or clear liquids for > 1 week.  Coretrak getting placed now.    *   Delerium.  Acute Met Encephalopathy.     PLAN   *   Continue supportive care.  Tube feedings will hopefull improve overall status in coming days, weeks.      Tom Scott  11/14/2020, 10:17 AM Phone [phone]  I have discussed the case with the APP, and that is the plan I formulated.  I was also not able to see the patient because he was having core track placement.  LFTs stable, combination of cholecystitis and acute illness.  No other changes from our perspective, we will check back in a couple of days.  Nelida Meuse III Office: [phone]

## 2020-11-14 NOTE — Progress Notes (Signed)
[place] for heparin Indication: atrial fibrillation  Patient Measurements: Height: '5\' 10"'$  (177.8 cm) Weight: 110.6 kg (243 lb 13.3 oz) IBW/kg (Calculated) : 73 Heparin Dosing Weight: 93.3 kg  Vital Signs: Temp: 99 F (37.2 C) (05/25 1600) Temp Source: Axillary (05/25 1600) BP: 88/51 (05/25 1900) Pulse Rate: 101 (05/25 1900)  Labs: Recent Labs    11/12/20 0334 11/12/20 2200 11/13/20 0338 11/13/20 0657 11/13/20 0800 11/14/20 0127 11/14/20 0350 11/14/20 0652 11/14/20 0947 11/14/20 1800  HGB 9.1*  --  10.0*  --   --   --  8.4*  --   --   --   HCT 27.7*  --  28.8*  --   --   --  24.3*  --   --   --   PLT 71*  --  70*  --   --   --  73*  --   --   --   HEPARINUNFRC  --    < >  --   --    < > 0.27*  --   --  0.35 0.32  CREATININE 5.50*  --   --  5.07*  --   --   --  5.06*  --   --    < > = values in this interval not displayed.    Estimated Creatinine Clearance: 17.6 mL/min (A) (by C-G formula based on SCr of 5.06 mg/dL (H)).   Assessment: 39 yof presenting with cholelithiasis - now found to be in Afib with RVR.  Underwent perc cholecystomy 5/20 - was on Xarelto PTA (but off since March d/t cost).   Confirmatory heparin level remains therapeutic and at the low end of normal.  No bleeding reported.  Goal of Therapy:  Heparin level 0.3-0.7 units/ml Monitor platelets by anticoagulation protocol: Yes   Plan:  Increase heparin gtt to 2950 units/hr F/U AM labs  Tom Scott, PharmD, BCPS, [place] 11/14/2020, 7:55 PM

## 2020-11-14 NOTE — Progress Notes (Addendum)
[place] for heparin Indication: atrial fibrillation  Patient Measurements: Height: '5\' 10"'$  (177.8 cm) Weight: 110.6 kg (243 lb 13.3 oz) IBW/kg (Calculated) : 73 Heparin Dosing Weight: 93.3 kg  Vital Signs: Temp: 99.7 F (37.6 C) (05/25 0803) Temp Source: Axillary (05/25 0803) BP: 92/53 (05/25 1100) Pulse Rate: 104 (05/25 1100)  Labs: Recent Labs    11/12/20 0334 11/12/20 2200 11/13/20 0338 11/13/20 0657 11/13/20 0800 11/13/20 1642 11/14/20 0127 11/14/20 0350 11/14/20 0652 11/14/20 0947  HGB 9.1*  --  10.0*  --   --   --   --  8.4*  --   --   HCT 27.7*  --  28.8*  --   --   --   --  24.3*  --   --   PLT 71*  --  70*  --   --   --   --  73*  --   --   HEPARINUNFRC  --    < >  --   --    < > 0.30 0.27*  --   --  0.35  CREATININE 5.50*  --   --  5.07*  --   --   --   --  5.06*  --    < > = values in this interval not displayed.    Estimated Creatinine Clearance: 17.6 mL/min (A) (by C-G formula based on SCr of 5.06 mg/dL (H)).   Assessment: 25 yof presenting with cholelithiasis - now found to be in Afib with RVR.  Underwent perc cholecystomy 5/20 - was on Xarelto PTA (but off since March d/t cost).   Heparin level 0.35 is therapeutic on heparin 2900 units/hr. Level drawn appropriately. Hgb 8.4. Plt 73 - stable. No noted bleeding.   Goal of Therapy:  Heparin level 0.3-0.7 units/ml Monitor platelets by anticoagulation protocol: Yes   Plan:  Continue heparin 2900 units/hr Check 8 hr confirmatory heparin level  Monitor heparin level, CBC and s/s of bleeding daily Follow up oral AC plans - case management consult placed to assess cost of apixaban  Cristela Felt, PharmD Clinical Pharmacist  11/14/2020 11:18 AM

## 2020-11-14 NOTE — Care Management (Signed)
[place] check sent for Eliquis

## 2020-11-14 NOTE — Procedures (Signed)
Cortrak  Person Inserting Tube:  Maylon Peppers C, RD Tube Type:  Cortrak - 43 inches Tube Location:  Right nare Initial Placement:  Postpyloric Secured by: Bridle Technique Used to Measure Tube Placement:  Documented cm marking at nare/ corner of mouth Cortrak Secured At:  71 cm    Cortrak Tube Team Note:  Consult received to place a Cortrak feeding tube.   X-ray is required, abdominal x-ray has been ordered by the Cortrak team. Please confirm tube placement before using the Cortrak tube.   If the tube becomes dislodged please keep the tube and contact the Cortrak team at [email or web address] (password [email or web address]) for replacement.  If after hours and replacement cannot be delayed, place a NG tube and confirm placement with an abdominal x-ray.    Lockie Pares., RD, LDN, CNSC See AMiON for contact information

## 2020-11-14 NOTE — Progress Notes (Signed)
[place] for IV Heparin Indication: atrial fibrillation  Patient Measurements: Height: '5\' 10"'$  (177.8 cm) Weight: 114 kg (251 lb 5.2 oz) IBW/kg (Calculated) : 73 Heparin Dosing Weight: 93.3 kg  Vital Signs: Temp: 99.9 F (37.7 C) (05/24 2320) Temp Source: Axillary (05/24 2320) BP: 103/53 (05/25 0200) Pulse Rate: 109 (05/25 0200)  Labs: Recent Labs    11/11/20 0418 11/11/20 0610 11/11/20 1600 11/11/20 1755 11/12/20 0141 11/12/20 0334 11/12/20 2200 11/13/20 0338 11/13/20 0657 11/13/20 0800 11/13/20 1642 11/14/20 0127  HGB 9.5*  --   --  10.2*  --  9.1*  --  10.0*  --   --   --   --   HCT 29.4*  --   --  30.0*  --  27.7*  --  28.8*  --   --   --   --   PLT 71*  --   --   --   --  71*  --  70*  --   --   --   --   HEPARINUNFRC  --    < > 0.22*  --    < >  --    < >  --   --  0.42 0.30 0.27*  CREATININE  --    < > 4.89*  --   --  5.50*  --   --  5.07*  --   --   --    < > = values in this interval not displayed.    Estimated Creatinine Clearance: 17.9 mL/min (A) (by C-G formula based on SCr of 5.07 mg/dL (H)).   Assessment: 65 yr old presented with cholelithiasis - now found to be in Afib with RVR.  Underwent perc cholecystomy 5/20 - was on Xarelto PTA (but off since March, due to cost).   Heparin level down to slightly subtherapeutic (0.27) on gtt at 2750 units/hr. No issues with line or bleeding reported per RN.  Goal of Therapy:  Heparin level 0.3-0.7 units/ml Monitor platelets by anticoagulation protocol: Yes   Plan:  Increase heparin infusion to 2900 units/hr Check heparin level in 8 hrs  Sherlon Handing, PharmD, BCPS Please see amion for complete clinical pharmacist phone list 11/14/2020 2:06 AM

## 2020-11-14 NOTE — Progress Notes (Signed)
Referring Physician(s): CCM Dr Tom Scott  Supervising Physician: Tom Scott  Patient Status:  [facility] - In-pt  Chief Complaint:  Sepsis  Acalculous cholecystitis s/p perc cholecystomy tube placement on 5/20 with Dr. Annamaria Scott.   Subjective:  Patient sitting on bed, not in acute distress, wife at the bedside.  Wife states patient is confused.   Allergies: Penicillamine and Penicillins  Medications: Prior to Admission medications   Medication Sig Start Date End Date Taking? Authorizing Provider  amLODipine (NORVASC) 10 MG tablet Take 10 mg by mouth every morning. 10/18/20  Yes [provider]  aspirin EC 81 MG tablet Take 162 mg by mouth daily. Swallow whole.   Yes [provider]  furosemide (LASIX) 20 MG tablet Take 40 mg by mouth daily. 09/18/20  Yes [provider]  hydrOXYzine (ATARAX/VISTARIL) 10 MG tablet Take 10-20 mg by mouth at bedtime. 10/29/20  Yes [provider]  irbesartan (AVAPRO) 150 MG tablet Take 150 mg by mouth daily. 08/30/20  Yes [provider]  metFORMIN (GLUCOPHAGE) 500 MG tablet Take 1,000 mg by mouth 2 (two) times daily. 09/14/20  Yes [provider]  metoprolol succinate (TOPROL-XL) 50 MG 24 hr tablet Take 50 mg by mouth every morning. 08/11/20  Yes [provider]  NONFORMULARY OR COMPOUNDED ITEM Apply 1 application topically 2 (two) times daily. TAC 0.1% cream + Hydrophor c&m 0.5% No more than a week at a time 08/13/20  Yes [provider]  rosuvastatin (CRESTOR) 10 MG tablet Take 10 mg by mouth at bedtime. 09/06/20  Yes [provider]     Vital Signs: BP (!) 92/53 (BP Location: Right Arm)   Pulse (!) 104   Temp 99.7 F (37.6 C) (Axillary)   Resp (!) 23   Ht '5\' 10"'$  (1.778 m)   Wt 243 lb 13.3 oz (110.6 kg)   SpO2 94%   BMI 34.99 kg/m   Physical Exam Vitals reviewed.  Constitutional:      General: He is not in acute distress.    Appearance: He is ill-appearing.  HENT:      Head: Normocephalic and atraumatic.  Cardiovascular:     Rate and Rhythm: Tachycardia present.  Pulmonary:     Effort: Pulmonary effort is normal.  Abdominal:     Palpations: Abdomen is soft.     Comments: Positive RUQ drain to a foley bag. Site is unremarkable with no erythema, edema, tenderness, bleeding or drainage. Suture and stat lock in place. Dressing is clean, dry, and intact. 100 ml of bloody fluid noted in the foley bag. Drain aspirates and flushes well.   Skin:    General: Skin is warm.     Comments:     Neurological:     Mental Status: He is disoriented.     Imaging: DG Chest 1 View  Result Date: 11/12/2020 Tom Craze, DO     11/12/2020 12:16 PM Central Venous Catheter Insertion Procedure Note Tom Scott [id number] Jul 20, 1955 Procedure: Insertion of Non-tunneled HD Catheter with US guidance Indications: HD Procedure Details Consent: Risks of procedure as well as the alternatives and risks of each were explained to the (patient/caregiver).  Consent for procedure obtained. Time Out: Verified patient identification, verified procedure, site/side was marked, verified correct patient position, special equipment/implants available, medications/allergies/relevent history reviewed, required imaging and test results available.  Performed Maximum sterile technique was used including antiseptics, cap, gloves, gown, hand hygiene, mask and sheet. Skin prep: Chlorhexidine; local anesthetic administered A antimicrobial bonded/coated triple  lumen catheter was placed in the left internal jugular vein using the Seldinger technique. Catheter placed to 15 cm. Blood aspirated via all 3 ports and then flushed x 3. Line sutured x 2 and dressing applied. Ultrasound guidance used.Yes.  Evaluation Blood flow good Complications: No apparent complications Patient did tolerate procedure well. Chest X-ray ordered to verify placement.  CXR: pending. Tom Ohms, DO PGY-2 IMTS   DG CHEST PORT 1  VIEW  Result Date: 11/12/2020 CLINICAL DATA:  Central line placement EXAM: PORTABLE CHEST 1 VIEW COMPARISON:  Portable exam 1412 hours compared to 11/08/2020 FINDINGS: Indwelling RIGHT jugular central venous catheter with tip projecting over cavoatrial junction. New LEFT subclavian central venous catheter with tip projecting over LEFT brachiocephalic vein. Enlargement of cardiac silhouette with pulmonary vascular congestion. Atherosclerotic calcification aorta. Bibasilar atelectasis and question minimal perihilar edema. No definite pleural effusion or pneumothorax. IMPRESSION: Enlargement of cardiac silhouette with pulmonary vascular congestion and question minimal perihilar edema. Bibasilar atelectasis. No pneumothorax following LEFT jugular line placement. Electronically Signed   By: Tom Scott M.D.   On: 11/12/2020 14:42   VAS [place] LOWER EXTREMITY VENOUS (DVT)  Result Date: 11/12/2020  Lower Venous DVT Study Patient Name:  Tom Scott  Date of Exam:   11/12/2020 Medical Rec #: [id number]      Accession #:    [id number] Date of Birth: Apr 22, 1956      Patient Gender: M Patient Age:   65Y Exam Location:  [facility] Procedure:      VAS [place] LOWER EXTREMITY VENOUS (DVT) Referring Phys: Tom Scott --------------------------------------------------------------------------------  Indications: Swelling, and Pain.  Risk Factors: None identified. Limitations: Body habitus, poor ultrasound/tissue interface and patient movement, patient pain tolerance, patient guarding. Comparison Study: No prior studies. Performing Technologist: Tom Scott RVT  Examination Guidelines: A complete evaluation includes B-mode imaging, spectral Doppler, color Doppler, and power Doppler as needed of all accessible portions of each vessel. Bilateral testing is considered an integral part of a complete examination. Limited examinations for reoccurring indications may be performed as noted. The reflux portion of the exam is  performed with the patient in reverse Trendelenburg.  +---------+---------------+---------+-----------+----------+-------------------+ RIGHT    CompressibilityPhasicitySpontaneityPropertiesThrombus Aging      +---------+---------------+---------+-----------+----------+-------------------+ CFV      Full           Yes      Yes                                      +---------+---------------+---------+-----------+----------+-------------------+ SFJ      Full                                                             +---------+---------------+---------+-----------+----------+-------------------+ FV Prox  Full                                                             +---------+---------------+---------+-----------+----------+-------------------+ FV Mid                  Yes  Yes                                      +---------+---------------+---------+-----------+----------+-------------------+ FV Distal               Yes      Yes                                      +---------+---------------+---------+-----------+----------+-------------------+ PFV                                                   Not well visualized +---------+---------------+---------+-----------+----------+-------------------+ POP                     Yes      Yes                                      +---------+---------------+---------+-----------+----------+-------------------+ PTV      Full                                                             +---------+---------------+---------+-----------+----------+-------------------+ PERO     Full                                                             +---------+---------------+---------+-----------+----------+-------------------+   +----+---------------+---------+-----------+----------+--------------+ LEFTCompressibilityPhasicitySpontaneityPropertiesThrombus Aging  +----+---------------+---------+-----------+----------+--------------+ CFV Full           Yes      Yes                                 +----+---------------+---------+-----------+----------+--------------+    Summary: RIGHT: - There is no evidence of deep vein thrombosis in the lower extremity. However, portions of this examination were limited- see technologist comments above.  - No cystic structure found in the popliteal fossa.  LEFT: - No evidence of common femoral vein obstruction.  *See table(s) above for measurements and observations. Electronically signed by Monica Martinez MD on 11/12/2020 at 5:17:16 PM.    Final     Labs:  CBC: Recent Labs    11/11/20 0418 11/11/20 1755 11/12/20 0334 11/13/20 0338 11/14/20 0350  WBC 18.6*  --  16.3* 17.2* 17.5*  HGB 9.5* 10.2* 9.1* 10.0* 8.4*  HCT 29.4* 30.0* 27.7* 28.8* 24.3*  PLT 71*  --  71* 70* 73*    COAGS: Recent Labs    11/08/20 1426  INR 1.4*    BMP: Recent Labs    11/11/20 1600 11/11/20 1755 11/12/20 0334 11/13/20 0657 11/14/20 0652  NA 133* 137 133* 137 136  K 4.0 3.9 3.9 3.9 3.5  CL 107  --  107 105 102  CO2 13*  --  13* 17* 16*  GLUCOSE 119*  --  112* 112* 123*  BUN 63*  --  69* 54* 51*  CALCIUM 7.7*  --  7.8* 8.3* 8.0*  CREATININE 4.89*  --  5.50* 5.07* 5.06*  GFRNONAA 12*  --  11* 12* 12*    LIVER FUNCTION TESTS: Recent Labs    11/10/20 0213 11/11/20 0835 11/12/20 0927 11/13/20 0657 11/14/20 0652  BILITOT 3.5* 3.8* 2.9* 2.6*  --   AST 125* 64* 45* 31  --   ALT 199* 117* 89* 70*  --   ALKPHOS 125 173* 170* 177*  --   PROT 5.7* 5.1* 5.3* 5.6*  --   ALBUMIN 2.6* 2.1* 2.0* 2.0* 1.8*    Assessment and Plan:  Acalculous cholecystitis s/p perc cholecystomy tube placement on 5/20 with Dr. Annamaria Scott.   Drain intact, flushes/aspirates well.  OP 300 cc VS hypotensive, tachycardic, tachypenic.  Hgb dropped from 10 to 8.4 today, WBC still elevated.  cx grew .E. coli   Continue with flushing TID,  output recording q shift and dressing changes as needed. Would consider additional imaging when output is less than 10 ml for 24 hours not including flush material.     Further treatment plan per PCCM/ CCS/ GI/ Nephrology  Appreciate and agree with the plan.  IR to follow.   Electronically Signed: Tera Mater, PA-C 11/14/2020, 1:09 PM   I spent a total of 15 Minutes at the the patient's bedside AND on the patient's hospital floor or unit, greater than 50% of which was counseling/coordinating care for perc chole drain

## 2020-11-14 NOTE — Progress Notes (Signed)
   11/14/20 1630  Clinical Encounter Type  Visited With Patient  Visit Type Other (Comment)  Referral From Nurse  Consult/Referral To Chaplain  Chaplain responded to consult for Advance Directive. Patient is alone, sleep and appears to be unable to make a decision regarding [place]. There is a precaution sign and PPE on the door. Unable to offer Advance Directive at this time. This note was prepared by Jeanine Luz, M.Div..  For questions please contact by phone [phone].

## 2020-11-14 NOTE — Progress Notes (Signed)
Nutrition Follow-up  DOCUMENTATION CODES:   Severe malnutrition in context of acute illness/injury  INTERVENTION:   Initiate tube feeds via Cortrak: - Start Vital 1.5 @ 20 ml/hr and advance by 10 ml q 8 hours to goal rate of 60 ml/hr (1440 ml/day) - ProSource TF 45 ml TID  Tube feeding regimen at goal provides 2280 kcal, 130 grams of protein, and 1100 ml of H2O.  Monitor magnesium, potassium, and phosphorus daily for at least 3 days, MD to replete as needed, as pt is at risk for refeeding syndrome given severe malnutrition, inadequate nutrition x 7 days.  - MVI with minerals BID per tube  - 500 mg calcium carbonate TID per tube  - Recommend supplementing vitamin D, discussed with CCM and pharmacy  NUTRITION DIAGNOSIS:   Severe Malnutrition related to acute illness (cholecystitis) as evidenced by mild fat depletion,moderate muscle depletion,energy intake < or equal to 50% for > or equal to 5 days.  Ongoing, being addressed via TF  GOAL:   Patient will meet greater than or equal to 90% of their needs  Met via TF at goal  MONITOR:   Diet advancement,Labs,Weight trends,TF tolerance,Skin,I & O's  REASON FOR ASSESSMENT:   NPO/Clear Liquid Diet    ASSESSMENT:   65 year old male who presented on 5/19 from [facility] with septic shock, cholecystitis. 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 Pt admitted with acute calculous cholecystitis, AKI.  5/20 - NG tube placed, percutaneous cholecystostomy tube placement 5/21 - NG tube removed, clear liquids 5/23 - first HD, NPO 5/25 - Cortrak placed (likely within the jejunum per x-ray)  Discussed pt with RN and during ICU rounds. Per Nephrology, anticipate HD tomorrow. Last HD was yesterday with 0 ml net UF.  Consult received for tube feeding initiation and management. Pt is at refeeding risk. Vitamin D low, discussed with CCM and pharmacy and plan is to supplement.  Admit  with: 98 kg Current weight: 110.6 kg  Pt with moderate pitting edema to BUE and BLE.  Medications reviewed and include: cholecalciferol 1000 units daily, SSI q 4 hours, IV protonix, amiodarone gtt, heparin gtt, IV abx  Vitamin/Mineral Profile:  Thiamine B1: pending Vitamin B12: 2699 (H) Vitamin A: pending Vitamin D: 17.47 (L) Vitamin C: pending Zinc: pending  Labs reviewed: BUN 51, creatinine 5.06, hemoglobin 8.4 CBG's: 113-135 x 24 hours  UOP: 37 ml x 24 hours Perc chole drain: 300 ml x 24 hours I/O's: +14.9 L since admit  Diet Order:   Diet Order            Diet NPO time specified  Diet effective now                 EDUCATION NEEDS:   No education needs have been identified at this time  Skin:  Skin Assessment: Skin Integrity Issues: Stage II: coccyx  Last BM:  11/13/20  Height:   Ht Readings from Last 1 Encounters:  11/08/20 5' 10"  (1.778 m)    Weight:   Wt Readings from Last 1 Encounters:  11/14/20 110.6 kg    BMI:  Body mass index is 34.99 kg/m.  Estimated Nutritional Needs:   Kcal:  7106-2694  Protein:  125-145 grams  Fluid:  >/= 2.0 L    Gustavus Bryant, MS, RD, LDN Inpatient Clinical Dietitian Please see AMiON for contact information.

## 2020-11-14 NOTE — Progress Notes (Addendum)
NAME:  Tom Scott, MRN:  [id number], DOB:  07-23-1955, LOS: 6 ADMISSION DATE:  11/08/2020, CONSULTATION DATE: 11/08/2020  CHIEF COMPLAINT:  Septic shock   History of Present Illness:   10 yoM with a history of atrial fibrillation on xarelto (not taking since 3/22), hypertension, DM, and HLD who initially presented to [facility] in [place] for chest and epigastric pain. Transferred to [facility] due to septic shock requiring pressors and concerns for choledocholithiasis.   He reports 2 weeks of intermittent abdominal pain that worsened 3 days ago. Endorses nausea, denies emesis, fevers, diarrhea. Sign out prior to transfer expressed concern for choledocholithiasis. On arrival, documentaion included a CT abdomen with cholelithiasis no cholecystitis. He also had lactic acidosis, transaminitis and an elevated t bili. He was also with atrial fibrillation with RVR and hypotensive started on pressors. Of note, he stopped taking Xarelto in March 2022 secondary to finances and has been taking aspirin. He   Past Medical History:  Atrial fibrillation HTN DM HLD Hx of gastric bypass   Significant Hospital Events:  - 5/19 admitted to [facility] ICU - 5/20 repeat US and CT abdomen showed acute calculous cholecystitis. IR placed percutaneous drain   Consults:  GI General surgery  Procedures:  - 5/19 central venous catheter  - 5/29 percutaneous cholecystostomy tube placed   - 5/23 nontunneled HD catheter placed    Significant Diagnostic Tests:  US abdomen and CT abdomen show acute calculous cholecystitis  RLE vascular US negative for DVT   Micro Data:   Gallbladder fluid 5/20 >> Gram variable rods, GPC's >>  Blood 5/20 >> Ecoli, ESBL   Antimicrobials:  Metronidazole 5/18> 5/21 Cefepime 5/19> 5/21 Levaquin 5/18 Meropenem 5/21 >>   Interim History / Subjective:   Overnight, no acute events. Patient's mentation continues to be poor. Unable to follow simple commands this morning, wakes  up briefly but very somnolent. Exquisitely tender to light touch diffusely.   Objective   Blood pressure (!) 95/50, pulse (!) 102, temperature 99.7 F (37.6 C), temperature source Axillary, resp. rate 19, height '5\' 10"'$  (1.778 m), weight 110.6 kg, SpO2 94 %.        Intake/Output Summary (Last 24 hours) at 11/14/2020 1058 Last data filed at 11/14/2020 1000 Gross per 24 hour  Intake 1169.45 ml  Output 382 ml  Net 787.45 ml   Filed Weights   11/13/20 1400 11/13/20 1635 11/14/20 0213  Weight: 113.3 kg 114 kg 110.6 kg    Examination: General: Elderly male, appears acutely ill, uncomfortable and agitated  HENT: Oropharynx clear, no secretions, no stridor Lungs: Clear bilaterally, decreased to both bases Cardiovascular: Irregularly irregular, no murmur Abdomen: Nondistended, obese, upper quadrant tenderness, no rebound, perc drain in place w/o signs of infection, clean and dry Extremities: RLE swelling, extremely tender to palpation Neuro: somnolent, not oriented, unable to answer questions or follow any commands, worsening since 5/22 Skin: No rash, warm  Resolved Hospital Problem list   none  Assessment & Plan:   Septic shock Acute calculous cholecystitis with E. coli bacteremia, ESBL positive  Transaminitis -Underwent percutaneous cholecystostomy 5/20 -Remains off pressors -Continue meropenem -GI and general surgery on board, appreciate recommendations  -Overall tenuous clinical picture, patient slow to recover despite appropriate interventions -Extensive discussion with family about goals of care, currently remains full code and to continue full scope of care  Acute metabolic encephalopathy  - Likely multifactorial in the setting of sepsis, uremia, hospital delirium and use of narcotics - Mentation marginally improved this  AM, following simple commands  - S/p HD x2, plan for HD again 5/26 - Avoid sedating medications as able  - Continue to monitor    AKI - Likely in the  setting of septic shock and use of contrast  - Cr stable at 5 today - UOP minimal  - Had HD yesterday, plan for HD again 5/26 - Trend BMP  Afib w/ RVR -Secondary to sepsis, HR improved -IV amiodarone again in the setting of AMS  -Continue heparin -Continue telemetry  Asymptomatic anemia - Hgb 8.4, w/o s/sxs of bleeding -Unclear hemoglobin baseline -Likely in the setting of sepsis and ARF -Monitor CBC  Right leg swelling Diffuse pain  - Patient remains extremely sensitive to touch, but pain is marginally improved since yesterday - 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 vs gout, unclear etiology - Careful titration of pain medications    HTN -Home antihypertensive regimen is on hold  DM -SSI -CBG monitoring  HLD -Restart statin once tolerating PO  Protein caloric malnutrition - Plan for Cortrak today - Consulted dietician, appreciate recs   Best practice (evaluated daily)  Diet: NPO, plan for coretrak today  Pain/Anxiety/Delirium protocol (if indicated): fentanyl VAP protocol (if indicated): n/a DVT prophylaxis: Heparin GI prophylaxis: PPI Glucose control: SSI Mobility: bedrest Disposition: ICU  Goals of Care:  Last date of multidisciplinary goals of care discussion: 5/25 Family and staff present: Yes Summary of discussion: continue current plan of care Follow up goals of care discussion due: n/a Code Status: Full code  Labs   CBC: Recent Labs  Lab 11/10/20 0213 11/11/20 0418 11/11/20 1755 11/12/20 0334 11/13/20 0338 11/14/20 0350  WBC 21.0* 18.6*  --  16.3* 17.2* 17.5*  HGB 10.2* 9.5* 10.2* 9.1* 10.0* 8.4*  HCT 31.7* 29.4* 30.0* 27.7* 28.8* 24.3*  MCV 85.0 82.8  --  81.2 76.6* 76.4*  PLT 92* 71*  --  71* 70* 73*    Basic Metabolic Panel: Recent Labs  Lab 11/11/20 0835 11/11/20 1600 11/11/20 1755 11/12/20 0334 11/13/20 0657 11/14/20 0652  NA 135 133* 137 133* 137 136  K 4.2 4.0 3.9 3.9 3.9 3.5  CL 110 107  --  107 105 102  CO2 13* 13*  --  13*  17* 16*  GLUCOSE 123* 119*  --  112* 112* 123*  BUN 61* 63*  --  69* 54* 51*  CREATININE 4.64* 4.89*  --  5.50* 5.07* 5.06*  CALCIUM 7.6* 7.7*  --  7.8* 8.3* 8.0*  PHOS  --   --   --   --   --  3.5   GFR: Estimated Creatinine Clearance: 17.6 mL/min (A) (by C-G formula based on SCr of 5.06 mg/dL (H)). Recent Labs  Lab 11/08/20 2035 11/09/20 0105 11/09/20 0358 11/10/20 2140 11/11/20 0418 11/12/20 0334 11/13/20 0338 11/14/20 0350  WBC  --   --    < >  --  18.6* 16.3* 17.2* 17.5*  LATICACIDVEN 4.8* 4.5*  --  0.8  --   --   --   --    < > = values in this interval not displayed.    Liver Function Tests: Recent Labs  Lab 11/09/20 0358 11/10/20 0213 11/11/20 0835 11/12/20 0927 11/13/20 0657 11/14/20 0652  AST 200* 125* 64* 45* 31  --   ALT 263* 199* 117* 89* 70*  --   ALKPHOS 103 125 173* 170* 177*  --   BILITOT 3.5* 3.5* 3.8* 2.9* 2.6*  --   PROT 5.6* 5.7* 5.1* 5.3* 5.6*  --  ALBUMIN 2.6* 2.6* 2.1* 2.0* 2.0* 1.8*   Recent Labs  Lab 11/08/20 1428 11/09/20 0358  LIPASE 79* 27   No results for input(s): AMMONIA in the last 168 hours.  ABG    Component Value Date/Time   PHART 7.328 (L) 11/11/2020 1755   PCO2ART 23.3 (L) 11/11/2020 1755   PO2ART 69 (L) 11/11/2020 1755   HCO3 12.2 (L) 11/11/2020 1755   TCO2 13 (L) 11/11/2020 1755   ACIDBASEDEF 12.0 (H) 11/11/2020 1755   O2SAT 93.0 11/11/2020 1755     Coagulation Profile: Recent Labs  Lab 11/08/20 1426  INR 1.4*    Cardiac Enzymes: No results for input(s): CKTOTAL, CKMB, CKMBINDEX, TROPONINI in the last 168 hours.  HbA1C: Hgb A1c MFr Bld  Date/Time Value Ref Range Status  11/08/2020 02:26 PM 8.3 (H) 4.8 - 5.6 % Final    Comment:    (NOTE) Pre diabetes:          5.7%-6.4%  Diabetes:              >6.4%  Glycemic control for   <7.0% adults with diabetes     CBG: Recent Labs  Lab 11/13/20 1726 11/13/20 2019 11/13/20 2317 11/14/20 0357 11/14/20 [place], DO PGY-2 IMTS

## 2020-11-14 NOTE — Progress Notes (Signed)
[facility] Progress Note  Name: Tom Scott MRN: [id number] DOB: 1955-11-11  Chief Complaint:  abd pain and chest pain  Subjective:  Last HD on 5/24.  No UF.  He has been anuric over 5/24.  He has been hypotensive.  He has been more interactive per nursing and following some commands.  No renal labs ordered today.   Review of systems:  Unable to obtain 2/2 AMS ----------- Background on consult:  Tom Scott is a 65 y.o. male, diabetes atrial fibrillation who presented to epigastric and chest pain.  Several days of abd pain which worsened in the 3 days before presentation.  He was transferred to [facility] for septic shock and concern for choledocolithiasis.  He was found to have septic shock due to calculus cholecystitis and gram negative bacteremia.  He is s/p percutaneous cholecystostomy on 5/20.  His course has been complicated by AKI.  Nephrology consulted for assistance with management of the same.  He had 150 mL uop over 5/22 and he had lasix 60 mg IV once on 5/22.  He has been on bicarb gtt.  He was on LR at 100 ml/hr 5/19-5/22.  He was on levo 5/19-5/20 and phenylephrine 5/19 - 5/22.  He had a CT abd/pelvis without contrast on 5/20 with no hydro.  Critical care has placed a nontunneled catheter for HD access.  His wife and daughter provided some history as he was not able to answer questions.  He said hi to his daughter but did not answer her questions, as well.    Intake/Output Summary (Last 24 hours) at 11/14/2020 0650 Last data filed at 11/14/2020 0600 Gross per 24 hour  Intake 1159.74 ml  Output 337 ml  Net 822.74 ml    Vitals:  Vitals:   11/14/20 0342 11/14/20 0400 11/14/20 0500 11/14/20 0600  BP:  (!) 89/57 (!) 83/51 (!) 101/58  Pulse:  99 (!) 101 98  Resp:  (!) 28 17 (!) 22  Temp: 99.6 F (37.6 C)     TempSrc: Oral     SpO2:  94% 93% 94%  Weight:      Height:         Physical Exam:  General: elderly male in bed  HEENT: NCAT Eyes: EOMI sclera  anicteric Neck: supple trachea midline Heart: S1S2 tachycardic; no rub  Lungs: clear and unlabored at rest; on 3 liters  Abdomen: soft/nondistended; RUQ abd drain in place Extremities: 1+ pitting edema; no cyanosis or clubbing  Skin: no rash on extremities exposed  Neuro: awakens to voice and shuts eyes tight/opens to command psych - calm; no agitation  Access: left IJ nontunneled HD catheter   Medications reviewed   Labs:  BMP Latest Ref Rng & Units 11/13/2020 11/12/2020 11/11/2020  Glucose 70 - 99 mg/dL 112(H) 112(H) -  BUN 8 - 23 mg/dL 54(H) 69(H) -  Creatinine 0.61 - 1.24 mg/dL 5.07(H) 5.50(H) -  Sodium 135 - 145 mmol/L 137 133(L) 137  Potassium 3.5 - 5.1 mmol/L 3.9 3.9 3.9  Chloride 98 - 111 mmol/L 105 107 -  CO2 22 - 32 mmol/L 17(L) 13(L) -  Calcium 8.9 - 10.3 mg/dL 8.3(L) 7.8(L) -     Assessment/Plan:   # AKI  - Secondary to ATN from septic shock; pt also with afib with RVR.  On lasix and irbesartan at home.  Started HD on 5/23 late pm after nontunneled catheter placed by critical care.  - ordered renal panel for today and daily x 5 to  start tomorrow - assess dialysis needs daily and anticipate HD on 5/26 - When able get UA and up/cr ratio - he is anuric    # septic shock  - secondary to calculus cholecystitis  - off of pressors though may need to start back given hypotension - hasn't tolerated fluid removal with HD - abx per primary team   # Gram negative Bacteremia - abx per primary team  - 5/20 blood cx here NGTD  # Metabolic acidosis  - setting of shock, AKI  - continue HD as per above  # Calculus cholecystitis  - s/p perc cholecystostomy 5/20   # Afib with RVR - on amio  # Encephalopathy acute - multifactorial -metabolic derangements  - HD as above   Claudia Desanctis, MD 11/14/2020 7:05 AM

## 2020-11-14 NOTE — Progress Notes (Signed)
Nurse asked Chaplain if she would see patient and visit with him and his family. Son and wife were bedside. Patient was in a great mood, laughing and smiling. Family seemed to be grateful that he was better. 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 Chaplain prayed with patient and family and stayed to talk a while. Chaplain is available when needed.    11/14/20 1705  Clinical Encounter Type  Visited With Patient;Patient and family together  Visit Type Initial  Referral From Nurse  Consult/Referral To Chaplain  Spiritual Encounters  Spiritual Needs Prayer;Emotional  Stress Factors  Patient Stress Factors Health changes  Family Stress Factors Health changes

## 2020-11-15 ENCOUNTER — Other Ambulatory Visit (HOSPITAL_COMMUNITY): Payer: Self-pay

## 2020-11-15 DIAGNOSIS — A419 Sepsis, unspecified organism: Secondary | ICD-10-CM | POA: Diagnosis not present

## 2020-11-15 DIAGNOSIS — N179 Acute kidney failure, unspecified: Secondary | ICD-10-CM | POA: Diagnosis not present

## 2020-11-15 DIAGNOSIS — R6521 Severe sepsis with septic shock: Secondary | ICD-10-CM | POA: Diagnosis not present

## 2020-11-15 LAB — MAGNESIUM
Magnesium: 2.1 mg/dL (ref 1.7–2.4)
Magnesium: 2 mg/dL (ref 1.7–2.4)

## 2020-11-15 LAB — HEPARIN LEVEL (UNFRACTIONATED): Heparin Unfractionated: 0.28 IU/mL — ABNORMAL LOW (ref 0.30–0.70)

## 2020-11-15 LAB — RENAL FUNCTION PANEL
Albumin: 1.7 g/dL — ABNORMAL LOW (ref 3.5–5.0)
Anion gap: 15 (ref 5–15)
BUN: 60 mg/dL — ABNORMAL HIGH (ref 8–23)
CO2: 19 mmol/L — ABNORMAL LOW (ref 22–32)
Calcium: 8.3 mg/dL — ABNORMAL LOW (ref 8.9–10.3)
Chloride: 102 mmol/L (ref 98–111)
Creatinine, Ser: 5.76 mg/dL — ABNORMAL HIGH (ref 0.61–1.24)
GFR, Estimated: 10 mL/min — ABNORMAL LOW (ref >60)
Glucose, Bld: 164 mg/dL — ABNORMAL HIGH (ref 70–99)
Phosphorus: 3.2 mg/dL (ref 2.5–4.6)
Potassium: 3.2 mmol/L — ABNORMAL LOW (ref 3.5–5.1)
Sodium: 136 mmol/L (ref 135–145)

## 2020-11-15 LAB — CBC
HCT: 24.2 % — ABNORMAL LOW (ref 39.0–52.0)
Hemoglobin: 8.9 g/dL — ABNORMAL LOW (ref 13.0–17.0)
MCH: 26.7 pg (ref 26.0–34.0)
MCHC: 36.8 g/dL — ABNORMAL HIGH (ref 30.0–36.0)
MCV: 72.7 fL — ABNORMAL LOW (ref 80.0–100.0)
Platelets: 96 10*3/uL — ABNORMAL LOW (ref 150–400)
RBC: 3.33 MIL/uL — ABNORMAL LOW (ref 4.22–5.81)
RDW: 17.8 % — ABNORMAL HIGH (ref 11.5–15.5)
WBC: 17.1 10*3/uL — ABNORMAL HIGH (ref 4.0–10.5)
nRBC: 0.1 % (ref 0.0–0.2)

## 2020-11-15 LAB — GLUCOSE, CAPILLARY
Glucose-Capillary: 155 mg/dL — ABNORMAL HIGH (ref 70–99)
Glucose-Capillary: 142 mg/dL — ABNORMAL HIGH (ref 70–99)
Glucose-Capillary: 146 mg/dL — ABNORMAL HIGH (ref 70–99)
Glucose-Capillary: 150 mg/dL — ABNORMAL HIGH (ref 70–99)
Glucose-Capillary: 170 mg/dL — ABNORMAL HIGH (ref 70–99)

## 2020-11-15 LAB — VITAMIN C: Vitamin C: <0.1 mg/dL — ABNORMAL LOW (ref 0.4–2.0)

## 2020-11-15 MED ORDER — ZINC SULFATE 220 (50 ZN) MG PO CAPS
220.0000 mg | ORAL_CAPSULE | Freq: Every day | ORAL | Status: DC
  Administered 2020-11-16 – 2020-11-18 (×3): 220 mg
  Filled 2020-11-15 (×3): qty 1

## 2020-11-15 MED ORDER — ENSURE ENLIVE PO LIQD
237.0000 mL | Freq: Two times a day (BID) | ORAL | Status: DC
  Administered 2020-11-16 – 2020-11-18 (×5): 237 mL via ORAL
  Filled 2020-11-15 (×3): qty 237

## 2020-11-15 MED ORDER — PANTOPRAZOLE SODIUM 40 MG PO PACK
40.0000 mg | PACK | Freq: Every day | ORAL | Status: DC
  Administered 2020-11-15 – 2020-11-16 (×2): 40 mg
  Filled 2020-11-15 (×2): qty 20

## 2020-11-15 MED ORDER — ROSUVASTATIN CALCIUM 5 MG PO TABS
10.0000 mg | ORAL_TABLET | Freq: Every day | ORAL | Status: DC
  Administered 2020-11-15 – 2020-11-18 (×4): 10 mg
  Filled 2020-11-15: qty 2
  Filled 2020-11-15 (×2): qty 1
  Filled 2020-11-15: qty 2
  Filled 2020-11-15: qty 1

## 2020-11-15 MED ORDER — AMIODARONE HCL 200 MG PO TABS
200.0000 mg | ORAL_TABLET | Freq: Every day | ORAL | Status: DC
  Administered 2020-11-15 – 2020-11-18 (×4): 200 mg
  Filled 2020-11-15 (×4): qty 1

## 2020-11-15 MED ORDER — ALBUMIN HUMAN 25 % IV SOLN
25.0000 g | Freq: Once | INTRAVENOUS | Status: AC
  Administered 2020-11-15: 25 g via INTRAVENOUS
  Filled 2020-11-15: qty 100

## 2020-11-15 MED ORDER — LACTATED RINGERS IV BOLUS
1000.0000 mL | Freq: Once | INTRAVENOUS | Status: AC
  Administered 2020-11-15: 1000 mL via INTRAVENOUS

## 2020-11-15 MED ORDER — APIXABAN 5 MG PO TABS
5.0000 mg | ORAL_TABLET | Freq: Two times a day (BID) | ORAL | Status: DC
  Administered 2020-11-15 – 2020-11-18 (×7): 5 mg
  Filled 2020-11-15 (×7): qty 1

## 2020-11-15 MED ORDER — ZINC SULFATE 220 (50 ZN) MG PO CAPS
220.0000 mg | ORAL_CAPSULE | Freq: Every day | ORAL | Status: DC
  Administered 2020-11-15: 220 mg via ORAL
  Filled 2020-11-15: qty 1

## 2020-11-15 NOTE — Progress Notes (Signed)
Subjective: Much better today in regards to his mental status.  Denies any abdominal pain at all.  ROS: see above  Objective: Vital signs in last 24 hours: Temp:  [98.2 F (36.8 C)-99.9 F (37.7 C)] 98.2 F (36.8 C) (05/26 0815) Pulse Rate:  [88-118] 103 (05/26 0800) Resp:  [8-29] 25 (05/26 0800) BP: (88-128)/(49-80) 113/65 (05/26 0915) SpO2:  [91 %-95 %] 95 % (05/26 0800) Weight:  [111.2 kg] 111.2 kg (05/26 0700) Last BM Date: 11/14/20  Intake/Output from previous day: 05/25 0701 - 05/26 0700 In: 1686.5 [P.O.:120; I.V.:1097.4; NG/GT:364; IV Piggyback:100.1] Out: 272 [Urine:72; Drains:200] Intake/Output this shift: Total I/O In: 74.7 [I.V.:44.7; NG/GT:30] Out: 10 [Urine:10]  PE: Abd: soft, NT, ND, +BS, perc chole drain in place with bilious output   Lab Results:  Recent Labs    11/14/20 0350 11/15/20 0500  WBC 17.5* 17.1*  HGB 8.4* 8.9*  HCT 24.3* 24.2*  PLT 73* 96*   BMET Recent Labs    11/14/20 0652 11/15/20 0500  NA 136 136  K 3.5 3.2*  CL 102 102  CO2 16* 19*  GLUCOSE 123* 164*  BUN 51* 60*  CREATININE 5.06* 5.76*  CALCIUM 8.0* 8.3*   PT/INR No results for input(s): LABPROT, INR in the last 72 hours. CMP     Component Value Date/Time   NA 136 11/15/2020 0500   K 3.2 (L) 11/15/2020 0500   CL 102 11/15/2020 0500   CO2 19 (L) 11/15/2020 0500   GLUCOSE 164 (H) 11/15/2020 0500   BUN 60 (H) 11/15/2020 0500   CREATININE 5.76 (H) 11/15/2020 0500   CALCIUM 8.3 (L) 11/15/2020 0500   PROT 5.6 (L) 11/13/2020 0657   ALBUMIN 1.7 (L) 11/15/2020 0500   AST 31 11/13/2020 0657   ALT 70 (H) 11/13/2020 0657   ALKPHOS 177 (H) 11/13/2020 0657   BILITOT 2.6 (H) 11/13/2020 0657   GFRNONAA 10 (L) 11/15/2020 0500   Lipase     Component Value Date/Time   LIPASE 27 11/09/2020 0358       Studies/Results: DG Abd Portable 1V  Result Date: 11/14/2020 CLINICAL DATA:  Feeding tube placement. EXAM: PORTABLE ABDOMEN - 1 VIEW COMPARISON:  Same day.  FINDINGS: Distal tip of feeding tube is seen in expected position of proximal jejunum in patient status post gastrojejunostomy. Percutaneous cholecystostomy is again noted. IMPRESSION: Distal tip of feeding tube seen in expected position of proximal jejunum. Electronically Signed   By: Marijo Conception M.D.   On: 11/14/2020 15:58   DG Abd Portable 1V  Result Date: 11/14/2020 CLINICAL DATA:  Feeding tube placement, history of gastric bypass EXAM: PORTABLE ABDOMEN - 1 VIEW COMPARISON:  CT abdomen/pelvis dated 11/09/2020 FINDINGS: Weighted feeding tube in the left upper abdomen adjacent to surgical clips. It is unclear radiographically whether this is within the gastric pouch or within the jejunum distal to the gastrojejunostomy, although the latter is favored when correlating with prior CT. Pigtail drainage catheter in the right upper abdomen, related to percutaneous cholecystostomy. Degenerative changes of the lumbar spine. IMPRESSION: Weighted feeding tube in the left upper abdomen, likely within the jejunum distal to the gastrojejunostomy, less likely within the gastric pouch. Status post percutaneous cholecystostomy. Electronically Signed   By: Julian Hy M.D.   On: 11/14/2020 13:15    Anti-infectives: Anti-infectives (From admission, onward)   Start     Dose/Rate Route Frequency Ordered Stop   11/13/20 2200  meropenem (MERREM) 500 mg in sodium chloride 0.9 %  100 mL IVPB        500 mg 200 mL/hr over 30 Minutes Intravenous Every 24 hours 11/13/20 0916     11/12/20 2200  meropenem (MERREM) 1 g in sodium chloride 0.9 % 100 mL IVPB  Status:  Discontinued        1 g 200 mL/hr over 30 Minutes Intravenous Every 24 hours 11/12/20 0742 11/13/20 0916   11/10/20 2300  meropenem (MERREM) 1 g in sodium chloride 0.9 % 100 mL IVPB  Status:  Discontinued        1 g 200 mL/hr over 30 Minutes Intravenous Every 12 hours 11/10/20 2207 11/12/20 0742   11/08/20 1600  ceFEPIme (MAXIPIME) 2 g in sodium chloride  0.9 % 100 mL IVPB  Status:  Discontinued        2 g 200 mL/hr over 30 Minutes Intravenous Every 12 hours 11/08/20 1456 11/10/20 2207   11/08/20 1500  metroNIDAZOLE (FLAGYL) IVPB 500 mg  Status:  Discontinued        500 mg 100 mL/hr over 60 Minutes Intravenous Every 8 hours 11/08/20 1404 11/10/20 2213       Assessment/Plan E coli bacteremia ARF - on HD DM A fib HTN Thrombocytopenia  Diffuse body pain  Acute cholecystitis -s/p perc chole drain by IR -LFTs now starting to down trend. -perc drain will need to remain in place 6-8 weeks at least. -patient looks significantly better today in regards to his mental state.  FEN - cortrak/regular diet VTE - heparin ID - merrem   LOS: 7 days    Henreitta Cea , [facility] 11/15/2020, 9:24 AM Please see Amion for pager number during day hours 7:00am-4:30pm or 7:00am -11:30am on weekends

## 2020-11-15 NOTE — Progress Notes (Signed)
[facility] Progress Note  Name: Tom Scott MRN: [id number] DOB: 02/28/1956  Chief Complaint:  abd pain and chest pain  Subjective:  Last HD on 5/24 with no UF.  Hasn't been started on HD yet today.  He has been anuric over 5/25.  HD RN has arrived.  Mental status better this AM per nursing (no HD yesterday).  Updated his wife at bedside.  Still doesn't always know why he is here per nursing.   Review of systems:  Limited but mental status improving ----------- Background on consult:  Tom Scott is a 65 y.o. male, diabetes atrial fibrillation who presented to epigastric and chest pain.  Several days of abd pain which worsened in the 3 days before presentation.  He was transferred to [facility] for septic shock and concern for choledocolithiasis.  He was found to have septic shock due to calculus cholecystitis and gram negative bacteremia.  He is s/p percutaneous cholecystostomy on 5/20.  His course has been complicated by AKI.  Nephrology consulted for assistance with management of the same.  He had 150 mL uop over 5/22 and he had lasix 60 mg IV once on 5/22.  He has been on bicarb gtt.  He was on LR at 100 ml/hr 5/19-5/22.  He was on levo 5/19-5/20 and phenylephrine 5/19 - 5/22.  He had a CT abd/pelvis without contrast on 5/20 with no hydro.  Critical care has placed a nontunneled catheter for HD access.  His wife and daughter provided some history as he was not able to answer questions.  He said hi to his daughter but did not answer her questions, as well.    Intake/Output Summary (Last 24 hours) at 11/15/2020 0644 Last data filed at 11/15/2020 0600 Gross per 24 hour  Intake 1656.15 ml  Output 272 ml  Net 1384.15 ml    Vitals:  Vitals:   11/15/20 0328 11/15/20 0400 11/15/20 0500 11/15/20 0600  BP:  115/70 (!) 101/57 106/64  Pulse:  (!) 106 93 97  Resp:  (!) 29 (!) 24 16  Temp: 99.9 F (37.7 C)     TempSrc: Axillary     SpO2:  92% 92% 94%  Weight:   111.2 kg    Height:         Physical Exam:  General: elderly male in bed  HEENT: NCAT Eyes: EOMI sclera anicteric Neck: supple trachea midline Heart: S1S2 tachycardic at 100 ; no rub  Lungs: clear and unlabored at rest Abdomen: soft/nondistended; RUQ abd drain Extremities: 2+ pitting edema; no cyanosis or clubbing  Skin: no rash on extremities exposed  Neuro: awake on arrival and oriented x 3  psych - calm; no agitation  Access: left IJ nontunneled HD catheter   Medications reviewed   Labs:  BMP Latest Ref Rng & Units 11/15/2020 11/14/2020 11/13/2020  Glucose 70 - 99 mg/dL 164(H) 123(H) 112(H)  BUN 8 - 23 mg/dL 60(H) 51(H) 54(H)  Creatinine 0.61 - 1.24 mg/dL 5.76(H) 5.06(H) 5.07(H)  Sodium 135 - 145 mmol/L 136 136 137  Potassium 3.5 - 5.1 mmol/L 3.2(L) 3.5 3.9  Chloride 98 - 111 mmol/L 102 102 105  CO2 22 - 32 mmol/L 19(L) 16(L) 17(L)  Calcium 8.9 - 10.3 mg/dL 8.3(L) 8.0(L) 8.3(L)     Assessment/Plan:   # AKI  - Secondary to ATN from septic shock; pt also with afib with RVR.  On lasix and irbesartan at home.  No hydro on CT a/p.  Started HD on 5/23 pm after  nontunneled catheter placed by critical care.  HD on 5/23 and 5/24 - HD today.  Assess needs daily.  Hopeful for improved clearance with higher flows/settings.  Have ordered albumin with HD and ok with low dose pressor - ie. Levo at 5 mcg/min - When able get UA and up/cr ratio - he is anuric - remove foley; strict ins/outs  # septic shock  - secondary to calculus cholecystitis  - off of pressors though may need to start back given hypotension - hasn't tolerated fluid removal with HD. - abx per primary team - on meropenem  # Gram negative Bacteremia - abx per primary team - on meropenem - 5/20 blood cx here negative  # Metabolic acidosis  - setting of shock, AKI  - continue HD as per above  # Calculus cholecystitis  - s/p perc cholecystostomy 5/20   # Afib with RVR - on amio  # Encephalopathy acute -  multifactorial -metabolic derangements. Some improvement with HD noted  - HD as above  - continue work -up per primary team   Claudia Desanctis, MD 11/15/2020 7:01 AM

## 2020-11-15 NOTE — Progress Notes (Signed)
Referring Physician(s): Byrum,R/[place]akefield,M  Supervising Physician: Mir, Biochemist, clinical  Patient Status:  Tom Scott - In-pt  Chief Complaint:  cholecystitis  Subjective: Pt awake, still with sl confusion, NG tube in place; family in room; denies worsening abd pain   Allergies: Penicillamine and Penicillins  Medications: Prior to Admission medications   Medication Sig Start Date End Date Taking? Authorizing Provider  amLODipine (NORVASC) 10 MG tablet Take 10 mg by mouth every morning. 10/18/20  Yes [provider]  aspirin EC 81 MG tablet Take 162 mg by mouth daily. Swallow whole.   Yes [provider]  furosemide (LASIX) 20 MG tablet Take 40 mg by mouth daily. 09/18/20  Yes [provider]  hydrOXYzine (ATARAX/VISTARIL) 10 MG tablet Take 10-20 mg by mouth at bedtime. 10/29/20  Yes [provider]  irbesartan (AVAPRO) 150 MG tablet Take 150 mg by mouth daily. 08/30/20  Yes [provider]  metFORMIN (GLUCOPHAGE) 500 MG tablet Take 1,000 mg by mouth 2 (two) times daily. 09/14/20  Yes [provider]  metoprolol succinate (TOPROL-XL) 50 MG 24 hr tablet Take 50 mg by mouth every morning. 08/11/20  Yes [provider]  NONFORMULARY OR COMPOUNDED ITEM Apply 1 application topically 2 (two) times daily. TAC 0.1% cream + Hydrophor c&m 0.5% No more than a week at a time 08/13/20  Yes [provider]  rosuvastatin (CRESTOR) 10 MG tablet Take 10 mg by mouth at bedtime. 09/06/20  Yes [provider]     Vital Signs: BP (!) 111/57   Pulse (!) 103   Temp 98.2 F (36.8 C) (Oral)   Resp (!) 25   Ht '5\' 10"'$  (1.778 m)   Wt 245 lb 2.4 oz (111.2 kg)   SpO2 95%   BMI 35.18 kg/m   Physical Exam GB drain intact, insertion site ok, OP 200 cc green bile; drain flushed without difficulty  Imaging: DG Chest 1 View  Result Date: 11/12/2020 Mike Craze, DO     11/12/2020 12:16 PM Central Venous Catheter Insertion Procedure Note  Davian Agredano [id number] 1955-09-16 Procedure: Insertion of Non-tunneled HD Catheter with US guidance Indications: HD Procedure Details Consent: Risks of procedure as well as the alternatives and risks of each were explained to the (patient/caregiver).  Consent for procedure obtained. Time Out: Verified patient identification, verified procedure, site/side was marked, verified correct patient position, special equipment/implants available, medications/allergies/relevent history reviewed, required imaging and test results available.  Performed Maximum sterile technique was used including antiseptics, cap, gloves, gown, hand hygiene, mask and sheet. Skin prep: Chlorhexidine; local anesthetic administered A antimicrobial bonded/coated triple lumen catheter was placed in the left internal jugular vein using the Seldinger technique. Catheter placed to 15 cm. Blood aspirated via all 3 ports and then flushed x 3. Line sutured x 2 and dressing applied. Ultrasound guidance used.Yes.  Evaluation Blood flow good Complications: No apparent complications Patient did tolerate procedure well. Chest X-ray ordered to verify placement.  CXR: pending. Harlow Ohms, DO PGY-2 IMTS   DG CHEST PORT 1 VIEW  Result Date: 11/12/2020 CLINICAL DATA:  Central line placement EXAM: PORTABLE CHEST 1 VIEW COMPARISON:  Portable exam 1412 hours compared to 11/08/2020 FINDINGS: Indwelling RIGHT jugular central venous catheter with tip projecting over cavoatrial junction. New LEFT subclavian central venous catheter with tip projecting over LEFT brachiocephalic vein. Enlargement of cardiac silhouette with pulmonary vascular congestion. Atherosclerotic calcification aorta. Bibasilar atelectasis and question minimal perihilar edema. No definite pleural effusion or pneumothorax. IMPRESSION: Enlargement of cardiac silhouette  with pulmonary vascular congestion and question minimal perihilar edema. Bibasilar atelectasis. No pneumothorax following LEFT  jugular line placement. Electronically Signed   By: Lavonia Dana M.D.   On: 11/12/2020 14:42   DG Abd Portable 1V  Result Date: 11/14/2020 CLINICAL DATA:  Feeding tube placement. EXAM: PORTABLE ABDOMEN - 1 VIEW COMPARISON:  Same day. FINDINGS: Distal tip of feeding tube is seen in expected position of proximal jejunum in patient status post gastrojejunostomy. Percutaneous cholecystostomy is again noted. IMPRESSION: Distal tip of feeding tube seen in expected position of proximal jejunum. Electronically Signed   By: Marijo Conception M.D.   On: 11/14/2020 15:58   DG Abd Portable 1V  Result Date: 11/14/2020 CLINICAL DATA:  Feeding tube placement, history of gastric bypass EXAM: PORTABLE ABDOMEN - 1 VIEW COMPARISON:  CT abdomen/pelvis dated 11/09/2020 FINDINGS: Weighted feeding tube in the left upper abdomen adjacent to surgical clips. It is unclear radiographically whether this is within the gastric pouch or within the jejunum distal to the gastrojejunostomy, although the latter is favored when correlating with prior CT. Pigtail drainage catheter in the right upper abdomen, related to percutaneous cholecystostomy. Degenerative changes of the lumbar spine. IMPRESSION: Weighted feeding tube in the left upper abdomen, likely within the jejunum distal to the gastrojejunostomy, less likely within the gastric pouch. Status post percutaneous cholecystostomy. Electronically Signed   By: Julian Hy M.D.   On: 11/14/2020 13:15   VAS [place] LOWER EXTREMITY VENOUS (DVT)  Result Date: 11/12/2020  Lower Venous DVT Study Patient Name:  Tom Scott  Date of Exam:   11/12/2020 Medical Rec #: [id number]      Accession #:    [id number] Date of Birth: [birth year]      Patient Gender: M Patient Age:   65Y Exam Location:  [facility] Procedure:      VAS [place] LOWER EXTREMITY VENOUS (DVT) Referring Phys: [place] --------------------------------------------------------------------------------  Indications:  Swelling, and Pain.  Risk Factors: None identified. Limitations: Body habitus, poor ultrasound/tissue interface and patient movement, patient pain tolerance, patient guarding. Comparison Study: No prior studies. Performing Technologist: Oliver Hum RVT  Examination Guidelines: A complete evaluation includes B-mode imaging, spectral Doppler, color Doppler, and power Doppler as needed of all accessible portions of each vessel. Bilateral testing is considered an integral part of a complete examination. Limited examinations for reoccurring indications may be performed as noted. The reflux portion of the exam is performed with the patient in reverse Trendelenburg.  +---------+---------------+---------+-----------+----------+-------------------+ RIGHT    CompressibilityPhasicitySpontaneityPropertiesThrombus Aging      +---------+---------------+---------+-----------+----------+-------------------+ CFV      Full           Yes      Yes                                      +---------+---------------+---------+-----------+----------+-------------------+ SFJ      Full                                                             +---------+---------------+---------+-----------+----------+-------------------+ FV Prox  Full                                                             +---------+---------------+---------+-----------+----------+-------------------+  FV Mid                  Yes      Yes                                      +---------+---------------+---------+-----------+----------+-------------------+ FV Distal               Yes      Yes                                      +---------+---------------+---------+-----------+----------+-------------------+ PFV                                                   Not well visualized +---------+---------------+---------+-----------+----------+-------------------+ POP                     Yes      Yes                                       +---------+---------------+---------+-----------+----------+-------------------+ PTV      Full                                                             +---------+---------------+---------+-----------+----------+-------------------+ PERO     Full                                                             +---------+---------------+---------+-----------+----------+-------------------+   +----+---------------+---------+-----------+----------+--------------+ LEFTCompressibilityPhasicitySpontaneityPropertiesThrombus Aging +----+---------------+---------+-----------+----------+--------------+ CFV Full           Yes      Yes                                 +----+---------------+---------+-----------+----------+--------------+    Summary: RIGHT: - There is no evidence of deep vein thrombosis in the lower extremity. However, portions of this examination were limited- see technologist comments above.  - No cystic structure found in the popliteal fossa.  LEFT: - No evidence of common femoral vein obstruction.  *See table(s) above for measurements and observations. Electronically signed by Monica Martinez MD on 11/12/2020 at 5:17:16 PM.    Final     Labs:  CBC: Recent Labs    11/12/20 0334 11/13/20 0338 11/14/20 0350 11/15/20 0500  WBC 16.3* 17.2* 17.5* 17.1*  HGB 9.1* 10.0* 8.4* 8.9*  HCT 27.7* 28.8* 24.3* 24.2*  PLT 71* 70* 73* 96*    COAGS: Recent Labs    11/08/20 1426  INR 1.4*    BMP: Recent Labs    11/12/20 0334 11/13/20 0657 11/14/20 0652 11/15/20 0500  NA 133* 137 136 136  K 3.9 3.9 3.5 3.2*  CL 107 105 102 102  CO2 13* 17* 16* 19*  GLUCOSE 112* 112* 123* 164*  BUN 69* 54* 51* 60*  CALCIUM 7.8* 8.3* 8.0* 8.3*  CREATININE 5.50* 5.07* 5.06* 5.76*  GFRNONAA 11* 12* 12* 10*    LIVER FUNCTION TESTS: Recent Labs    11/10/20 0213 11/11/20 0835 11/12/20 0927 11/13/20 0657 11/14/20 0652 11/15/20 0500  BILITOT 3.5* 3.8* 2.9*  2.6*  --   --   AST 125* 64* 45* 31  --   --   ALT 199* 117* 89* 70*  --   --   ALKPHOS 125 173* 170* 177*  --   --   PROT 5.7* 5.1* 5.3* 5.6*  --   --   ALBUMIN 2.6* 2.1* 2.0* 2.0* 1.8* 1.7*    Assessment and Plan: Pt with hx acute calculous cholecystitis/E coli bacteremia, ARF; s/p perc GB drain 5/20; afebrile, WBC 17.1(17.5), hgb 8.9(8.4), K 3.2, creat 5.76; bile cx- e coli; GB drain will need to remain in place at least 6 weeks unless GB removed in interim; cont drain irrigation/OP monitoring/lab checks   Electronically Signed: D. Rowe Robert, PA-C 11/15/2020, 10:21 AM   I spent a total of 15 minutes at the the patient's bedside AND on the patient's hospital floor or unit, greater than 50% of which was counseling/coordinating care for gallbladder drain    Patient ID: Tom Scott, male   DOB: 1956/03/21, 65 y.o.   MRN: [id number]

## 2020-11-15 NOTE — Progress Notes (Signed)
Pharmacy Antibiotic Note  Tom Scott is a 65 y.o. male transferred to [facility] on 11/08/2020 with cholelithiasis.  Pharmacy has been consulted for meropenem. Blood culture from [facility] with ESBL e. Coli (sensitive to meropenem) and b fragilis. Gallbladder abscess with ESBL e.coli and b. Fragilis. Blood culture here negative. Patient has been on meropenem - today is day 6. Afebrile. WBC 17.1.   Plan: Continue meropenem '500mg'$  IV q24h (iHD dosing) Follow up renal plans and length of therapy   Height: '5\' 10"'$  (177.8 cm) Weight: 111.2 kg (245 lb 2.4 oz) IBW/kg (Calculated) : 73  Temp (24hrs), Avg:99.1 F (37.3 C), Min:98.2 F (36.8 C), Max:99.9 F (37.7 C)  Recent Labs  Lab 11/08/20 2035 11/09/20 0105 11/09/20 0358 11/10/20 2140 11/11/20 0418 11/11/20 0835 11/11/20 1600 11/12/20 0334 11/13/20 0338 11/13/20 0657 11/14/20 0350 11/14/20 0652 11/15/20 0500  WBC  --   --    < >  --  18.6*  --   --  16.3* 17.2*  --  17.5*  --  17.1*  CREATININE  --   --    < >  --   --    < > 4.89* 5.50*  --  5.07*  --  5.06* 5.76*  LATICACIDVEN 4.8* 4.5*  --  0.8  --   --   --   --   --   --   --   --   --    < > = values in this interval not displayed.    Estimated Creatinine Clearance: 15.5 mL/min (A) (by C-G formula based on SCr of 5.76 mg/dL (H)).    Allergies  Allergen Reactions  . Penicillamine     Other reaction(s): lips swell  . Penicillins     Other reaction(s): Swelling  Legacy System: CCA Onset Date: <blank> Substance Legacy/Cerner: penicillins / penicillins (Legacy value) Category: Drug Severity Legacy/Cerner: <blank> / Unknown Reaction(s): swelling Comments: <blank>  Legacy System: CCA Onset Date: <blank> Substance Legacy/Cerner: ampicillin / ampicillin (Legacy value) Category: Drug Severity Legacy/Cerner: <blank> / Unknown Reaction(s): swelling Comments: <blank>  Legacy System: CCA Onset Date: <blank> Substance Legacy/Cerner: ampicillin / ampicillin  (Legacy value) Category: Drug Severity Legacy/Cerner: <blank> / Unknown Reaction(s): swelling Comments: <blank>  Legacy System: CCA Onset Date: <blank> Substance Legacy/Cerner: penicillins / penicillins (Legacy value) Category: Drug Severity Legacy/Cerner: <blank> / Unknown Reaction(s): swelling Comments: <blank>     Antimicrobials this admission: LVQ x1 PTA  Flagyl PTA 5/19 >> 5/21 Cefepime 5/19 >> 5/21 Mero 5/21 >>   Microbiology results: 5/19 MRSA PCR - negative 5/20 Bcx: negative 5/20 Gall bladder abscess: ESBL Ecoli, rare B. Fragilis, beta lactamase pos 5/18 Bcx [facility]): ESBL e. Coli, b. fragilis  Thank you for allowing pharmacy to be a part of this patient's care.  Cristela Felt, PharmD Clinical Pharmacist  11/15/2020 9:13 AM

## 2020-11-15 NOTE — Progress Notes (Signed)
NAME:  Tom Scott, MRN:  [id number], DOB:  Apr 25, 1956, LOS: 7 ADMISSION DATE:  11/08/2020, CONSULTATION DATE: 11/08/2020  CHIEF COMPLAINT:  Septic shock   History of Present Illness:   78 yoM with a history of atrial fibrillation on xarelto (not taking since 3/22), hypertension, DM, and HLD who initially presented to [facility] in [place] for chest and epigastric pain. Transferred to [facility] due to septic shock requiring pressors and concerns for choledocholithiasis.   He reports 2 weeks of intermittent abdominal pain that worsened 3 days ago. Endorses nausea, denies emesis, fevers, diarrhea. Sign out prior to transfer expressed concern for choledocholithiasis. On arrival, documentaion included a CT abdomen with cholelithiasis no cholecystitis. He also had lactic acidosis, transaminitis and an elevated t bili. He was also with atrial fibrillation with RVR and hypotensive started on pressors. Of note, he stopped taking Xarelto in March 2022 secondary to finances and has been taking aspirin. He   Past Medical History:  Atrial fibrillation HTN DM HLD Hx of gastric bypass   Significant Hospital Events:  - 5/19 admitted to [facility] ICU - 5/20 repeat US and CT abdomen showed acute calculous cholecystitis. IR placed percutaneous drain   Consults:  GI General surgery  Procedures:  - 5/19 central venous catheter  - 5/29 percutaneous cholecystostomy tube placed   - 5/23 nontunneled HD catheter placed    Significant Diagnostic Tests:  US abdomen and CT abdomen show acute calculous cholecystitis  RLE vascular US negative for DVT   Micro Data:   Gallbladder fluid 5/20 >> Gram variable rods, GPC's >>  Blood 5/20 >> Ecoli, ESBL   Antimicrobials:  Metronidazole 5/18> 5/21 Cefepime 5/19> 5/21 Levaquin 5/18 Meropenem 5/21 >>   Interim History / Subjective:   NGT placed, nutrition started  I/O + 16L total  Hypokalemia, acidosis better Remains somewhat confused but pain is  resolved  Objective   Blood pressure 126/80, pulse (!) 102, temperature 99.9 F (37.7 C), temperature source Axillary, resp. rate (!) 22, height '5\' 10"'$  (1.778 m), weight 111.2 kg, SpO2 95 %.        Intake/Output Summary (Last 24 hours) at 11/15/2020 0723 Last data filed at 11/15/2020 0600 Gross per 24 hour  Intake 1610.44 ml  Output 272 ml  Net 1338.44 ml   Filed Weights   11/14/20 0213 11/15/20 0500 11/15/20 0700  Weight: 110.6 kg 111.2 kg 111.2 kg    Examination: General: Elderly male, less agitated and more comfortable today HENT: Oropharynx clear, no secretions, no stridor Lungs: Clear bilaterally, decreased at both bases Cardiovascular: Irregularly irregular, good rate control Abdomen: No pain on palpation.  Positive bowel sounds Extremities: RLE swelling, tenderness improved Neuro: Awake, fully oriented, is able to answer questions and interact improved over the last 24 hours Skin: No rash  Resolved Hospital Problem list   none  Assessment & Plan:   Septic shock Acute calculous cholecystitis with E. coli bacteremia, ESBL positive  Transaminitis -Underwent percutaneous cholecystostomy 5/20 -Pressors off -Plan to continue meropenem, will need to determine stop date depending on overall clinical improvement.  His abdominal exam is better 5/26 -Appreciate GI and surgery assistance in management  Acute metabolic encephalopathy, improving - Likely multifactorial in the setting of sepsis, uremia, hospital delirium and use of narcotics -Continue to follow mentation.  Hopefully will continue to improve with HD, performing 5/26 -Avoid sedating medications as able.  He does have fentanyl ordered as needed  AKI -HD today 5/26 -Still hope that he may have a  renal recovery although he is anuric/oliguric.  Okay to remove Foley catheter for now but suspect it will have to be reintroduced to get accurate urine measurements once stabilized from acute uremia. -We will have to have  discussions regarding whether he would want long-term HD if we do not think there is going to be a renal recovery -Follow urine output, BMP  Afib w/ RVR -Secondary to sepsis, HR improved -Change IV amiodarone to per tube on 5/26 -Continue heparin, may be able to consider oral anticoagulation in the near future -Continue telemetry  Asymptomatic anemia -Follow CBC  Right leg swelling Diffuse pain  -Pain improved -No evidence of DVT -Continue to follow.  Hopefully will improve with volume removal with HD   HTN -Continue to hold Home antihypertensive regimen  DM -SSI -CBG monitoring  HLD -Okay to resume statin per tube  Protein caloric malnutrition -Tolerated core track placement -Tube feeding as ordered   Best practice (evaluated daily)  Diet: TF Pain/Anxiety/Delirium protocol (if indicated): fentanyl VAP protocol (if indicated): n/a DVT prophylaxis: Heparin GI prophylaxis: PPI Glucose control: SSI Mobility: bedrest Disposition: ICU  Goals of Care:  Last date of multidisciplinary goals of care discussion: 5/25 Family and staff present: Yes Summary of discussion: continue current plan of care Follow up goals of care discussion due: n/a Code Status: Full code Family: Updated daily, spoke with wife and son on 5/26  Labs   CBC: Recent Labs  Lab 11/11/20 0418 11/11/20 1755 11/12/20 0334 11/13/20 0338 11/14/20 0350 11/15/20 0500  WBC 18.6*  --  16.3* 17.2* 17.5* 17.1*  HGB 9.5* 10.2* 9.1* 10.0* 8.4* 8.9*  HCT 29.4* 30.0* 27.7* 28.8* 24.3* 24.2*  MCV 82.8  --  81.2 76.6* 76.4* 72.7*  PLT 71*  --  71* 70* 73* 96*    Basic Metabolic Panel: Recent Labs  Lab 11/11/20 1600 11/11/20 1755 11/12/20 0334 11/13/20 0657 11/14/20 0652 11/14/20 1823 11/15/20 0500  NA 133* 137 133* 137 136  --  136  K 4.0 3.9 3.9 3.9 3.5  --  3.2*  CL 107  --  107 105 102  --  102  CO2 13*  --  13* 17* 16*  --  19*  GLUCOSE 119*  --  112* 112* 123*  --  164*  BUN 63*  --  69*  54* 51*  --  60*  CREATININE 4.89*  --  5.50* 5.07* 5.06*  --  5.76*  CALCIUM 7.7*  --  7.8* 8.3* 8.0*  --  8.3*  MG  --   --   --   --   --  2.0 2.1  PHOS  --   --   --   --  3.5  --  3.2   GFR: Estimated Creatinine Clearance: 15.5 mL/min (A) (by C-G formula based on SCr of 5.76 mg/dL (H)). Recent Labs  Lab 11/08/20 2035 11/09/20 0105 11/09/20 0358 11/10/20 2140 11/11/20 0418 11/12/20 0334 11/13/20 0338 11/14/20 0350 11/15/20 0500  WBC  --   --    < >  --    < > 16.3* 17.2* 17.5* 17.1*  LATICACIDVEN 4.8* 4.5*  --  0.8  --   --   --   --   --    < > = values in this interval not displayed.    Liver Function Tests: Recent Labs  Lab 11/09/20 0358 11/10/20 0213 11/11/20 0835 11/12/20 0927 11/13/20 0657 11/14/20 0652 11/15/20 0500  AST 200* 125* 64* 45* 31  --   --  ALT 263* 199* 117* 89* 70*  --   --   ALKPHOS 103 125 173* 170* 177*  --   --   BILITOT 3.5* 3.5* 3.8* 2.9* 2.6*  --   --   PROT 5.6* 5.7* 5.1* 5.3* 5.6*  --   --   ALBUMIN 2.6* 2.6* 2.1* 2.0* 2.0* 1.8* 1.7*   Recent Labs  Lab 11/08/20 1428 11/09/20 0358  LIPASE 79* 27   No results for input(s): AMMONIA in the last 168 hours.  ABG    Component Value Date/Time   PHART 7.328 (L) 11/11/2020 1755   PCO2ART 23.3 (L) 11/11/2020 1755   PO2ART 69 (L) 11/11/2020 1755   HCO3 12.2 (L) 11/11/2020 1755   TCO2 13 (L) 11/11/2020 1755   ACIDBASEDEF 12.0 (H) 11/11/2020 1755   O2SAT 93.0 11/11/2020 1755     Coagulation Profile: Recent Labs  Lab 11/08/20 1426  INR 1.4*    Cardiac Enzymes: No results for input(s): CKTOTAL, CKMB, CKMBINDEX, TROPONINI in the last 168 hours.  HbA1C: Hgb A1c MFr Bld  Date/Time Value Ref Range Status  11/08/2020 02:26 PM 8.3 (H) 4.8 - 5.6 % Final    Comment:    (NOTE) Pre diabetes:          5.7%-6.4%  Diabetes:              >6.4%  Glycemic control for   <7.0% adults with diabetes     CBG: Recent Labs  Lab 11/14/20 1149 11/14/20 1555 11/14/20 1959  11/14/20 2353 11/15/20 0326  GLUCAP 127* 186* 173* 165* 155*   Independent critical care time 31 minutes  Baltazar Apo, MD, PhD 11/15/2020, 8:44 AM [facility] [phone] or if no answer before 7:00PM call [phone] For any issues after 7:00PM please call eLink [phone]

## 2020-11-15 NOTE — TOC Benefit Eligibility Note (Signed)
Transition of Care [facility]) Benefit Eligibility Note    Patient Details  Name: Add Jezewski MRN: [id number] Date of Birth: 11-28-1955   Medication/Dose: Eliquis '5mg'$ . bid for 30 day supply  Covered?: Yes  Tier: 3 Drug  Prescription Coverage [facility]erred [facility]  Spoke with Person/Company/Phone Number:: Elsi H. W[employer] [phone]  Co-Pay: $78.95  Prior Approval: No  Deductible: Unmet       Shelda Altes Phone Number: 11/15/2020, 9:57 AM

## 2020-11-15 NOTE — Progress Notes (Signed)
Nutrition Follow-up  DOCUMENTATION CODES:   Severe malnutrition in context of acute illness/injury  INTERVENTION:   - Trial of Ensure Enlive po BID, each supplement provides 350 kcal and 20 grams of protein  Continue tube feeds via Cortrak (in jejunum): - Continue advancing Vital 1.5 to goal rate of 60 ml/hr (1440 ml/day) - ProSource TF 45 ml TID  Tube feeding regimen at goal provides 2280 kcal, 130 grams of protein, and 1100 ml of H2O.  - MVI with minerals BID per tube  - 500 mg calcium carbonate TID per tube  - Continue to follow vitamin/mineral labs and supplement as needed  NUTRITION DIAGNOSIS:   Severe Malnutrition related to acute illness (cholecystitis) as evidenced by mild fat depletion,moderate muscle depletion,energy intake < or equal to 50% for > or equal to 5 days.  Ongoing, being addressed via TF  GOAL:   Patient will meet greater than or equal to 90% of their needs  Met via TF  MONITOR:   Diet advancement,Labs,Weight trends,TF tolerance,Skin,I & O's  REASON FOR ASSESSMENT:   NPO/Clear Liquid Diet    ASSESSMENT:   65 year old male who presented on 5/19 from [facility] with septic shock, cholecystitis. 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 Pt admitted with acute calculous cholecystitis, AKI.  5/20 - NG tube placed, percutaneous cholecystostomy tube placement 5/21 - NG tube removed, clear liquids 5/23 - first HD, NPO 5/25 - Cortrak placed (in jejunum), diet advanced to Regular  Discussed pt with RN and during ICU rounds. Pt had HD this morning with 1500 ml net UF. CCM hopeful that mentation with continue to improve with HD.  RN reports pt with minimal PO intake and consumed 1 mango ice pop so far today. Unlikely that pt will be able to meet nutritional needs via PO route. Tube feeds currently infusing at 40 ml/hr. Continue titration of TF to goal rate of 60 ml/hr. Once pt eating more, can  transition to nocturnal TF.  Spoke with pt and wife at bedside. Pt refusing to eat anything this afternoon and pt's wife appears distressed by this. Assured pt's wife that pt would receive adequate nutrition via Cortrak until PO intake improves. RD to trial Ensure Enlive to see if pt accepts this supplement.  Admit with: 98 kg Current weight: 111.2 kg  Pt continues to have moderate pitting edema to BUE and BLE.  Medications reviewed and include: calcium carbonate 500 mg TID, cholecalciferol, SSI q 4 hors, MVI with minerals BID, protonix, zinc sulfate 220 mg daily, IV abx  Vitamin/Mineral Profile:  Thiamine B1: pending Vitamin B12: 2699 (H) Vitamin A: pending Vitamin D: 17.47 (L) Vitamin C: pending Zinc: 37 (L)  Labs reviewed: potassium 3.2, BUN 60, creatinine 5.76, hemoglobin 8.9 CBG's: 142-186 x 24 hours  UOP: 72 x 24 hours Perc chole drain: 200 ml x 24 hours I/O's: +15.2 L since admit  Diet Order:   Diet Order            Diet regular Room service appropriate? Yes; Fluid consistency: Thin  Diet effective now                 EDUCATION NEEDS:   No education needs have been identified at this time  Skin:  Skin Assessment: Skin Integrity Issues: Stage II: coccyx  Last BM:  11/15/20 large type 7  Height:   Ht Readings from Last 1 Encounters:  11/08/20 5' 10"  (1.778 m)    Weight:  Wt Readings from Last 1 Encounters:  11/15/20 111.2 kg    BMI:  Body mass index is 35.18 kg/m.  Estimated Nutritional Needs:   Kcal:  1655-3748  Protein:  125-145 grams  Fluid:  >/= 2.0 L    Gustavus Bryant, MS, RD, LDN Inpatient Clinical Dietitian Please see AMiON for contact information.

## 2020-11-15 NOTE — Progress Notes (Addendum)
[place] for heparin Indication: atrial fibrillation  Patient Measurements: Height: '5\' 10"'$  (177.8 cm) Weight: 111.2 kg (245 lb 2.4 oz) IBW/kg (Calculated) : 73 Heparin Dosing Weight: 93.3 kg  Vital Signs: Temp: 99.9 F (37.7 C) (05/26 0328) Temp Source: Axillary (05/26 0328) BP: 106/64 (05/26 0600) Pulse Rate: 97 (05/26 0600)  Labs: Recent Labs    11/13/20 0338 11/13/20 0657 11/13/20 0800 11/14/20 0350 11/14/20 0652 11/14/20 0947 11/14/20 1800 11/15/20 0500 11/15/20 0505  HGB 10.0*  --   --  8.4*  --   --   --  8.9*  --   HCT 28.8*  --   --  24.3*  --   --   --  24.2*  --   PLT 70*  --   --  73*  --   --   --  96*  --   HEPARINUNFRC  --   --    < >  --   --  0.35 0.32  --  0.28*  CREATININE  --  5.07*  --   --  5.06*  --   --  5.76*  --    < > = values in this interval not displayed.    Estimated Creatinine Clearance: 15.5 mL/min (A) (by C-G formula based on SCr of 5.76 mg/dL (H)).   Assessment: 41 yof presenting with cholelithiasis - now found to be in Afib with RVR.  Underwent perc cholecystomy 5/20 - was on Xarelto PTA (but off since March d/t cost).   Heparin level 0.28 is subtherapeutic on heparin 2950 units/hr after being therapeutic x2. Level drawn appropriately. Per overnight RN no issues with IV infusion or access. Hgb 8.9. Plt 96 - increase. No noted bleeding.   Goal of Therapy:  Heparin level 0.3-0.7 units/ml Monitor platelets by anticoagulation protocol: Yes   Plan:  Increase heparin to 3050 units/hr Check 8 hr heparin level  Monitor heparin level, CBC and s/s of bleeding daily Follow up oral AC plans - case management consult placed to assess cost of apixaban  Cristela Felt, PharmD Clinical Pharmacist  11/15/2020 7:15 AM

## 2020-11-15 NOTE — TOC Benefit Eligibility Note (Signed)
Patient Teacher, English as a foreign language completed.    The patient is currently admitted and upon discharge could be taking Eliquis 5 mg.  The current 30 day co-pay is, $88.85, due to a $41.85 remaining deductible.  After this time, monthly copay will be $47.00  The patient is insured through [place], [place] Patient Advocate Specialist [place] Team Direct Number: [phone]  Fax: [phone]

## 2020-11-16 DIAGNOSIS — N179 Acute kidney failure, unspecified: Secondary | ICD-10-CM | POA: Diagnosis not present

## 2020-11-16 DIAGNOSIS — R7989 Other specified abnormal findings of blood chemistry: Secondary | ICD-10-CM | POA: Diagnosis not present

## 2020-11-16 DIAGNOSIS — A419 Sepsis, unspecified organism: Secondary | ICD-10-CM | POA: Diagnosis not present

## 2020-11-16 DIAGNOSIS — K810 Acute cholecystitis: Secondary | ICD-10-CM | POA: Diagnosis not present

## 2020-11-16 DIAGNOSIS — R6521 Severe sepsis with septic shock: Secondary | ICD-10-CM | POA: Diagnosis not present

## 2020-11-16 LAB — GLUCOSE, CAPILLARY
Glucose-Capillary: 147 mg/dL — ABNORMAL HIGH (ref 70–99)
Glucose-Capillary: 151 mg/dL — ABNORMAL HIGH (ref 70–99)
Glucose-Capillary: 163 mg/dL — ABNORMAL HIGH (ref 70–99)
Glucose-Capillary: 167 mg/dL — ABNORMAL HIGH (ref 70–99)
Glucose-Capillary: 215 mg/dL — ABNORMAL HIGH (ref 70–99)
Glucose-Capillary: 217 mg/dL — ABNORMAL HIGH (ref 70–99)

## 2020-11-16 LAB — RENAL FUNCTION PANEL
Albumin: 1.9 g/dL — ABNORMAL LOW (ref 3.5–5.0)
Anion gap: 8 (ref 5–15)
BUN: 50 mg/dL — ABNORMAL HIGH (ref 8–23)
CO2: 24 mmol/L (ref 22–32)
Calcium: 7.9 mg/dL — ABNORMAL LOW (ref 8.9–10.3)
Chloride: 103 mmol/L (ref 98–111)
Creatinine, Ser: 4.93 mg/dL — ABNORMAL HIGH (ref 0.61–1.24)
GFR, Estimated: 12 mL/min — ABNORMAL LOW (ref >60)
Glucose, Bld: 164 mg/dL — ABNORMAL HIGH (ref 70–99)
Phosphorus: 3.1 mg/dL (ref 2.5–4.6)
Potassium: 2.9 mmol/L — ABNORMAL LOW (ref 3.5–5.1)
Sodium: 135 mmol/L (ref 135–145)

## 2020-11-16 LAB — CBC
HCT: 23.4 % — ABNORMAL LOW (ref 39.0–52.0)
Hemoglobin: 8.4 g/dL — ABNORMAL LOW (ref 13.0–17.0)
MCH: 26.3 pg (ref 26.0–34.0)
MCHC: 35.9 g/dL (ref 30.0–36.0)
MCV: 73.1 fL — ABNORMAL LOW (ref 80.0–100.0)
Platelets: 107 10*3/uL — ABNORMAL LOW (ref 150–400)
RBC: 3.2 MIL/uL — ABNORMAL LOW (ref 4.22–5.81)
RDW: 18.8 % — ABNORMAL HIGH (ref 11.5–15.5)
WBC: 13 10*3/uL — ABNORMAL HIGH (ref 4.0–10.5)
nRBC: 0.2 % (ref 0.0–0.2)

## 2020-11-16 LAB — MAGNESIUM: Magnesium: 2.1 mg/dL (ref 1.7–2.4)

## 2020-11-16 MED ORDER — POTASSIUM CHLORIDE 20 MEQ PO PACK
40.0000 meq | PACK | Freq: Once | ORAL | Status: AC
  Administered 2020-11-16: 40 meq
  Filled 2020-11-16: qty 2

## 2020-11-16 MED ORDER — CHLORHEXIDINE GLUCONATE CLOTH 2 % EX PADS
6.0000 | MEDICATED_PAD | Freq: Every day | CUTANEOUS | Status: DC
  Administered 2020-11-17 – 2020-12-02 (×16): 6 via TOPICAL

## 2020-11-16 MED ORDER — POTASSIUM CHLORIDE CRYS ER 20 MEQ PO TBCR: 40.0000 meq | EXTENDED_RELEASE_TABLET | Freq: Once | ORAL | Status: DC

## 2020-11-16 MED ORDER — LIDOCAINE 5 % EX PTCH
1.0000 | MEDICATED_PATCH | CUTANEOUS | Status: DC
  Administered 2020-11-17 – 2020-12-02 (×9): 1 via TRANSDERMAL
  Filled 2020-11-16 (×10): qty 1

## 2020-11-16 NOTE — Progress Notes (Signed)
Pt transferred to room 6N19 via bed from 50M, family at bedside.  Nurse will tube up the feeding vital to be restarted.  HOB up 45 degrees.

## 2020-11-16 NOTE — Progress Notes (Signed)
   11/16/20 1735  Assess: MEWS Score  Temp 99 F (37.2 C)  BP 99/61  Pulse Rate 95  Resp (!) 22  Level of Consciousness Alert  SpO2 93 %  O2 Device Room Air  Patient Activity (if Appropriate) In bed  Assess: MEWS Score  MEWS Temp 0  MEWS Systolic 1  MEWS Pulse 0  MEWS RR 1  MEWS LOC 0  MEWS Score 2  MEWS Score Color Yellow  Assess: if the MEWS score is Yellow or Red  Were vital signs taken at a resting state? Yes  Focused Assessment No change from prior assessment  MEWS guidelines implemented *See Row Information* Yes  Treat  Pain Scale 0-10  Pain Score 0  Take Vital Signs  Increase Vital Sign Frequency  Yellow: Q 2hr X 2 then Q 4hr X 2, if remains yellow, continue Q 4hrs  Escalate  MEWS: Escalate Yellow: discuss with charge nurse/RN and consider discussing with provider and RRT  Notify: Charge Nurse/RN  Name of Charge Nurse/RN Notified Esperanza Richters, RN  Date Charge Nurse/RN Notified 11/16/20  Time Charge Nurse/RN Notified 1735  Document  Patient Outcome Stabilized after interventions  Progress note created (see row info) Yes

## 2020-11-16 NOTE — Progress Notes (Signed)
NAME:  Tom Scott, MRN:  [id number], DOB:  1956-05-15, LOS: 8 ADMISSION DATE:  11/08/2020, CONSULTATION DATE: 11/08/2020  CHIEF COMPLAINT:  Septic shock   History of Present Illness:   31 yoM with a history of atrial fibrillation on xarelto (not taking since 3/22), hypertension, DM, and HLD who initially presented to [facility] in [place] for chest and epigastric pain. Transferred to [facility] due to septic shock requiring pressors and concerns for choledocholithiasis.   He reports 2 weeks of intermittent abdominal pain that worsened 3 days ago. Endorses nausea, denies emesis, fevers, diarrhea. Sign out prior to transfer expressed concern for choledocholithiasis. On arrival, documentaion included a CT abdomen with cholelithiasis no cholecystitis. He also had lactic acidosis, transaminitis and an elevated t bili. He was also with atrial fibrillation with RVR and hypotensive started on pressors. Of note, he stopped taking Xarelto in March 2022 secondary to finances and has been taking aspirin.  Past Medical History:  Atrial fibrillation HTN DM HLD Hx of gastric bypass   Significant Hospital Events:  - 5/19 admitted to [facility] ICU - 5/20 repeat US and CT abdomen showed acute calculous cholecystitis. IR placed percutaneous drain  -HD started 5/23  Consults:  GI General surgery  Procedures:  - 5/19 central venous catheter  - 5/29 percutaneous cholecystostomy tube placed   - 5/23 nontunneled HD catheter placed    Significant Diagnostic Tests:  US abdomen and CT abdomen show acute calculous cholecystitis  RLE vascular US negative for DVT   Micro Data:   Gallbladder fluid 5/20 >> E coli, ESBL Blood 5/20 (OSH) >> Ecoli, ESBL Blood 5/20 >> negative     Antimicrobials:  Metronidazole 5/18> 5/21 Cefepime 5/19> 5/21 Levaquin 5/18 Meropenem 5/21 >> (planning for 10 days)  Interim History / Subjective:   Tolerated HD on 5/26 Feels stronger, more awake and interacting with  family Hypokalemia Leukocytosis improving Platelets improving I/O+ 17.2 L total  Objective   Blood pressure 98/62, pulse 92, temperature 98 F (36.7 C), temperature source Axillary, resp. rate (!) 24, height '5\' 10"'$  (1.778 m), weight 114 kg, SpO2 96 %.        Intake/Output Summary (Last 24 hours) at 11/16/2020 1415 Last data filed at 11/16/2020 0600 Gross per 24 hour  Intake 2015.32 ml  Output 50 ml  Net 1965.32 ml   Filed Weights   11/15/20 0500 11/15/20 0700 11/16/20 0500  Weight: 111.2 kg 111.2 kg 114 kg    Examination: General: Elderly man, less agitated, more comfortable and interacting HENT: Oropharynx clear, no stridor no secretions Lungs: Decreased at both bases, otherwise clear, no crackles or wheezes Cardiovascular: Irregularly irregular, distant, no murmur Abdomen: No pain on palpation, nondistended, positive bowel sounds.  Drain in place Extremities: Bilateral lower extremity swelling, right greater than left.  Tenderness improved Neuro: Awake, oriented to self, place.  More interactive.  Following commands Skin: No rash  Resolved Hospital Problem list   none  Assessment & Plan:   Septic shock Acute calculous cholecystitis with E. coli bacteremia, ESBL positive  Transaminitis -5/20 percutaneous cholecystostomy -Pressors weaned off -Plan to continue meropenem for 10 days total -Appreciate GI and surgery assistance in management  Acute metabolic encephalopathy, improving -Continues to improve as we treat his uremia.  Careful with sedating medications including narcotics.  We will try to use a lidocaine patch for his back discomfort -Follow mentation closely  AKI -Tolerating HD, last on 5/26. -Still hoping for renal recovery although he remains oliguric.  Following BMP  and urine output.  May continue to require HD.  I have advised him and his family that possibly he might require it lifelong.  They will have to consider this and whether he would want  lifelong HD  Afib w/ RVR -Due to sepsis, improved -Amiodarone per tube -Heparin transitioned to Eliquis -Telemetry monitoring  Asymptomatic anemia -Follow CBC  Right leg swelling Diffuse pain  -Lower extremity pain improved, question whether this was related to edema and or uremia -No evidence DVT lower extremities -Continue to follow   HTN -Home antihypertensive regimen is on hold  DM -Sliding-scale insulin -CBG monitoring  HLD -Continue statin per tube  Protein caloric malnutrition -Tube feeding per core track tube. -Push oral intake as he is able to tolerate   Best practice (evaluated daily)  Diet: TF Pain/Anxiety/Delirium protocol (if indicated): fentanyl VAP protocol (if indicated): n/a DVT prophylaxis: Heparin GI prophylaxis: PPI Glucose control: SSI Mobility: bedrest Disposition: To telemetry  Goals of Care:  Last date of multidisciplinary goals of care discussion: 5/25 Family and staff present: Yes Summary of discussion: continue current plan of care Follow up goals of care discussion due: n/a Code Status: Full code Family: Updating wife and son daily, 5/27  Labs   CBC: Recent Labs  Lab 11/12/20 0334 11/13/20 0338 11/14/20 0350 11/15/20 0500 11/16/20 0500  WBC 16.3* 17.2* 17.5* 17.1* 13.0*  HGB 9.1* 10.0* 8.4* 8.9* 8.4*  HCT 27.7* 28.8* 24.3* 24.2* 23.4*  MCV 81.2 76.6* 76.4* 72.7* 73.1*  PLT 71* 70* 73* 96* 107*    Basic Metabolic Panel: Recent Labs  Lab 11/12/20 0334 11/13/20 0657 11/14/20 0652 11/14/20 1823 11/15/20 0500 11/15/20 1700 11/16/20 0500  NA 133* 137 136  --  136  --  135  K 3.9 3.9 3.5  --  3.2*  --  2.9*  CL 107 105 102  --  102  --  103  CO2 13* 17* 16*  --  19*  --  24  GLUCOSE 112* 112* 123*  --  164*  --  164*  BUN 69* 54* 51*  --  60*  --  50*  CREATININE 5.50* 5.07* 5.06*  --  5.76*  --  4.93*  CALCIUM 7.8* 8.3* 8.0*  --  8.3*  --  7.9*  MG  --   --   --  2.0 2.1 2.0 2.1  PHOS  --   --  3.5  --  3.2  --   3.1   GFR: Estimated Creatinine Clearance: 18.4 mL/min (A) (by C-G formula based on SCr of 4.93 mg/dL (H)). Recent Labs  Lab 11/10/20 2140 11/11/20 0418 11/13/20 0338 11/14/20 0350 11/15/20 0500 11/16/20 0500  WBC  --    < > 17.2* 17.5* 17.1* 13.0*  LATICACIDVEN 0.8  --   --   --   --   --    < > = values in this interval not displayed.    Liver Function Tests: Recent Labs  Lab 11/10/20 0213 11/11/20 0835 11/12/20 0927 11/13/20 0657 11/14/20 0652 11/15/20 0500 11/16/20 0500  AST 125* 64* 45* 31  --   --   --   ALT 199* 117* 89* 70*  --   --   --   ALKPHOS 125 173* 170* 177*  --   --   --   BILITOT 3.5* 3.8* 2.9* 2.6*  --   --   --   PROT 5.7* 5.1* 5.3* 5.6*  --   --   --  ALBUMIN 2.6* 2.1* 2.0* 2.0* 1.8* 1.7* 1.9*   No results for input(s): LIPASE, AMYLASE in the last 168 hours. No results for input(s): AMMONIA in the last 168 hours.  ABG    Component Value Date/Time   PHART 7.328 (L) 11/11/2020 1755   PCO2ART 23.3 (L) 11/11/2020 1755   PO2ART 69 (L) 11/11/2020 1755   HCO3 12.2 (L) 11/11/2020 1755   TCO2 13 (L) 11/11/2020 1755   ACIDBASEDEF 12.0 (H) 11/11/2020 1755   O2SAT 93.0 11/11/2020 1755     Coagulation Profile: No results for input(s): INR, PROTIME in the last 168 hours.  Cardiac Enzymes: No results for input(s): CKTOTAL, CKMB, CKMBINDEX, TROPONINI in the last 168 hours.  HbA1C: Hgb A1c MFr Bld  Date/Time Value Ref Range Status  11/08/2020 02:26 PM 8.3 (H) 4.8 - 5.6 % Final    Comment:    (NOTE) Pre diabetes:          5.7%-6.4%  Diabetes:              >6.4%  Glycemic control for   <7.0% adults with diabetes     CBG: Recent Labs  Lab 11/15/20 1909 11/16/20 0011 11/16/20 0330 11/16/20 0840 11/16/20 1237  GLUCAP 170* 217* 147* 215* 167*     Baltazar Apo, MD, PhD 11/16/2020, 2:15 PM [facility] [phone] or if no answer before 7:00PM call [phone] For any issues after 7:00PM please call eLink  [phone]

## 2020-11-16 NOTE — Progress Notes (Signed)
[facility] Progress Note  Name: Seanmichael Hatch MRN: [id number] DOB: April 30, 1956  Chief Complaint:  abd pain and chest pain  Subjective:  Last HD on 5/26 with 1.5 kg UF.  He has been anuric.  Mental status better.   Review of systems:   States breathing is ok Denies chest pain  Denies n/v   ----------- Background on consult:  Mordche Parpart is a 65 y.o. male, diabetes atrial fibrillation who presented to epigastric and chest pain.  Several days of abd pain which worsened in the 3 days before presentation.  He was transferred to [facility] for septic shock and concern for choledocolithiasis.  He was found to have septic shock due to calculus cholecystitis and gram negative bacteremia.  He is s/p percutaneous cholecystostomy on 5/20.  His course has been complicated by AKI.  Nephrology consulted for assistance with management of the same.  He had 150 mL uop over 5/22 and he had lasix 60 mg IV once on 5/22.  He has been on bicarb gtt.  He was on LR at 100 ml/hr 5/19-5/22.  He was on levo 5/19-5/20 and phenylephrine 5/19 - 5/22.  He had a CT abd/pelvis without contrast on 5/20 with no hydro.  Critical care has placed a nontunneled catheter for HD access.  His wife and daughter provided some history as he was not able to answer questions.  He said hi to his daughter but did not answer her questions, as well.    Intake/Output Summary (Last 24 hours) at 11/16/2020 0637 Last data filed at 11/16/2020 0400 Gross per 24 hour  Intake 2680.65 ml  Output 1710 ml  Net 970.65 ml    Vitals:  Vitals:   11/16/20 0300 11/16/20 0330 11/16/20 0400 11/16/20 0500  BP: 94/66  104/65   Pulse: 93  (!) 101   Resp: (!) 7  (!) 23   Temp:  98.2 F (36.8 C)    TempSrc:  Oral    SpO2: 96%  94%   Weight:    114 kg  Height:         Physical Exam:    General: elderly male in bed  HEENT: NCAT Eyes: EOMI sclera anicteric Neck: supple trachea midline Heart: S1S2 tachycardic at 100 ; no rub  Lungs:  clear and unlabored at rest Abdomen: soft/nondistended; RUQ abd drain Extremities: 2+ pitting edema; no cyanosis or clubbing  Skin: no rash on extremities exposed  Neuro: awake on arrival and oriented x 3  psych - calm; no agitation  Access: left IJ nontunneled HD catheter   Medications reviewed   Labs:  BMP Latest Ref Rng & Units 11/15/2020 11/14/2020 11/13/2020  Glucose 70 - 99 mg/dL 164(H) 123(H) 112(H)  BUN 8 - 23 mg/dL 60(H) 51(H) 54(H)  Creatinine 0.61 - 1.24 mg/dL 5.76(H) 5.06(H) 5.07(H)  Sodium 135 - 145 mmol/L 136 136 137  Potassium 3.5 - 5.1 mmol/L 3.2(L) 3.5 3.9  Chloride 98 - 111 mmol/L 102 102 105  CO2 22 - 32 mmol/L 19(L) 16(L) 17(L)  Calcium 8.9 - 10.3 mg/dL 8.3(L) 8.0(L) 8.3(L)     Assessment/Plan:   # AKI  - Secondary to ATN from septic shock; pt also with afib with RVR.  On lasix and irbesartan at home.  No hydro on CT a/p.  Started HD on 5/23 pm after nontunneled catheter placed by critical care.  HD on 5/23 and 5/24 - labs pending today  - anticipate HD on 5/28 - When able get UA and up/cr  ratio - he is anuric - strict ins/outs  # septic shock  - secondary to calculus cholecystitis  - abx per primary team - on meropenem  # Gram negative Bacteremia - abx per primary team - on meropenem - 5/20 blood cx here negative  # Metabolic acidosis  - setting of shock, AKI  - continue HD as per above  # Calculus cholecystitis  - s/p perc cholecystostomy 5/20   # Afib with RVR - on amio now is PO  # Encephalopathy acute - multifactorial -metabolic derangements. Some improvement with HD noted  - HD as above  - continue work -up per primary team   Claudia Desanctis, MD 11/16/2020 6:46 AM

## 2020-11-16 NOTE — Progress Notes (Addendum)
[facility] Progress Note  Chief Complaint: Elevated LFTs  History: Patient remains on CRRT, though off today.  He has been tolerating some solid food in small amounts.  He also continues to get tube feeds through a core track. He is more alert than last I saw him and his interactive, and his wife is at the bedside.  He still remains acute and chronically ill-appearing. Says he feels "great", and has little pain at the cholecystostomy site.  ROS: Cardiovascular: Denies chest pain Respiratory: Breathing somewhat short, says it is getting better day by day   Objective:   Current Facility-Administered Medications:  .  0.9 %  sodium chloride infusion, 250 mL, Intravenous, Continuous, Bowser, Grace E, NP .  amiodarone (PACERONE) tablet 200 mg, 200 mg, Per Tube, Daily, Collene Gobble, MD, 200 mg at 11/16/20 5329 .  apixaban (ELIQUIS) tablet 5 mg, 5 mg, Per Tube, BID, Collene Gobble, MD, 5 mg at 11/16/20 9242 .  calcium carbonate (TUMS - dosed in mg elemental calcium) chewable tablet 200 mg of elemental calcium, 1 tablet, Per Tube, TID, Collene Gobble, MD, 200 mg of elemental calcium at 11/16/20 0922 .  chlorhexidine (PERIDEX) 0.12 % solution 15 mL, 15 mL, Mouth Rinse, BID, Halford Chessman, Vineet, MD, 15 mL at 11/16/20 0941 .  Chlorhexidine Gluconate Cloth 2 % PADS 6 each, 6 each, Topical, Q0600, Claudia Desanctis, MD, 6 each at 11/16/20 0100 .  Chlorhexidine Gluconate Cloth 2 % PADS 6 each, 6 each, Topical, Q0600, Claudia Desanctis, MD .  cholecalciferol (VITAMIN D3) tablet 1,000 Units, 1,000 Units, Per Tube, Daily, Rehman, Areeg N, DO, 1,000 Units at 11/16/20 0922 .  docusate (COLACE) 50 MG/5ML liquid 100 mg, 100 mg, Per Tube, BID PRN, Henri Medal, RPH .  feeding supplement (ENSURE ENLIVE / ENSURE PLUS) liquid 237 mL, 237 mL, Oral, BID BM, Collene Gobble, MD, 237 mL at 11/16/20 0940 .  feeding supplement (PROSource TF) liquid 45 mL, 45 mL, Per Tube, TID, Collene Gobble, MD, 45 mL at 11/16/20 0923 .   feeding supplement (VITAL 1.5 CAL) liquid 1,000 mL, 1,000 mL, Per Tube, Continuous, Collene Gobble, MD, Last Rate: 60 mL/hr at 11/16/20 0314, 1,000 mL at 11/16/20 0314 .  fentaNYL (SUBLIMAZE) injection 12.5 mcg, 12.5 mcg, Intravenous, Q4H PRN, Rehman, Areeg N, DO, 12.5 mcg at 11/13/20 2147 .  heparin injection 1,000-6,000 Units, 1,000-6,000 Units, Intracatheter, PRN, Collene Gobble, MD, 2,000 Units at 11/15/20 [phone] .  insulin aspart (novoLOG) injection 0-15 Units, 0-15 Units, Subcutaneous, Q4H, Bowser, Laurel Dimmer, NP, 5 Units at 11/16/20 [phone] .  MEDLINE mouth rinse, 15 mL, Mouth Rinse, q12n4p, Sood, Vineet, MD, 15 mL at 11/15/20 1540 .  meropenem (MERREM) 500 mg in sodium chloride 0.9 % 100 mL IVPB, 500 mg, Intravenous, Q24H, Millen, Jessica B, RPH, Last Rate: 200 mL/hr at 11/15/20 2200, Infusion Verify at 11/15/20 2200 .  multivitamin with minerals tablet 1 tablet, 1 tablet, Per Tube, BID, Collene Gobble, MD, 1 tablet at 11/16/20 [phone] .  ondansetron (ZOFRAN) injection 4 mg, 4 mg, Intravenous, Q6H PRN, Bowser, Grace E, NP, 4 mg at 11/09/20 0840 .  pantoprazole sodium (PROTONIX) 40 mg/20 mL oral suspension 40 mg, 40 mg, Per Tube, Daily, Henri Medal, RPH, 40 mg at 11/16/20 9892 .  polyethylene glycol (MIRALAX / GLYCOLAX) packet 17 g, 17 g, Per Tube, Daily PRN, Henri Medal, RPH .  potassium chloride (KLOR-CON) packet 40 mEq, 40 mEq, Per Tube, Once,  Claudia Desanctis, MD .  rosuvastatin (CRESTOR) tablet 10 mg, 10 mg, Per Tube, Daily, Collene Gobble, MD, 10 mg at 11/16/20 3500 .  sodium chloride flush (NS) 0.9 % injection 10-40 mL, 10-40 mL, Intracatheter, Q12H, Agarwala, Ravi, MD, 10 mL at 11/15/20 2118 .  sodium chloride flush (NS) 0.9 % injection 10-40 mL, 10-40 mL, Intracatheter, PRN, Agarwala, Ravi, MD .  sodium chloride flush (NS) 0.9 % injection 5 mL, 5 mL, Intracatheter, Q8H, Greggory Keen, MD, 5 mL at 11/16/20 0549 .  zinc sulfate capsule 220 mg, 220 mg, Per Tube, Daily, Collene Gobble, MD,  220 mg at 11/16/20 [phone]  . sodium chloride    . feeding supplement (VITAL 1.5 CAL) 1,000 mL (11/16/20 0314)  . meropenem (MERREM) IV 200 mL/hr at 11/15/20 2200     Vital signs in last 24 hrs: Vitals:   11/16/20 0600 11/16/20 0700  BP: 98/62   Pulse: 92   Resp: (!) 24   Temp:  99.4 F (37.4 C)  SpO2: 96%     Intake/Output Summary (Last 24 hours) at 11/16/2020 1125 Last data filed at 11/16/2020 0600 Gross per 24 hour  Intake 2305.58 ml  Output 200 ml  Net 2105.58 ml     Physical Exam Ill-appearing, but nontoxic.  Core track tube exiting nostril. Left carotid dialysis catheter  HEENT: sclera anicteric, oral mucosa dry  Cardiac: Irregular without murmurs, + peripheral edema  Pulm: Rhonchi bilaterally, normal RR and effort noted  Abdomen: soft, no tenderness, with active bowel sounds. No guarding or palpable hepatosplenomegaly.  Clear fluid and some greenish debris in his cholecystostomy collection bag/tube.  Skin; warm and dry, no jaundice.  Pale  Recent Labs:  CBC Latest Ref Rng & Units 11/16/2020 11/15/2020 11/14/2020  WBC 4.0 - 10.5 K/uL 13.0(H) 17.1(H) 17.5(H)  Hemoglobin 13.0 - 17.0 g/dL 8.4(L) 8.9(L) 8.4(L)  Hematocrit 39.0 - 52.0 % 23.4(L) 24.2(L) 24.3(L)  Platelets 150 - 400 K/uL 107(L) 96(L) 73(L)    No results for input(s): INR in the last 168 hours. CMP Latest Ref Rng & Units 11/16/2020 11/15/2020 11/14/2020  Glucose 70 - 99 mg/dL 164(H) 164(H) 123(H)  BUN 8 - 23 mg/dL 50(H) 60(H) 51(H)  Creatinine 0.61 - 1.24 mg/dL 4.93(H) 5.76(H) 5.06(H)  Sodium 135 - 145 mmol/L 135 136 136  Potassium 3.5 - 5.1 mmol/L 2.9(L) 3.2(L) 3.5  Chloride 98 - 111 mmol/L 103 102 102  CO2 22 - 32 mmol/L 24 19(L) 16(L)  Calcium 8.9 - 10.3 mg/dL 7.9(L) 8.3(L) 8.0(L)  Total Protein 6.5 - 8.1 g/dL - - -  Total Bilirubin 0.3 - 1.2 mg/dL - - -  Alkaline Phos 38 - 126 U/L - - -  AST 15 - 41 U/L - - -  ALT 0 - 44 U/L - - -   No LFTs in several days  Radiologic  studies:   Assessment & Plan  Assessment:  Elevated LFTs in the setting of severe acute cholecystitis requiring cholecystostomy tube due to the degree of patient's illness. Acute renal failure Sepsis clearing Thrombocytopenia due to acute illness.  Our clinical impression thus far is that he most likely does not have choledocholithiasis.  LFT elevation likely from edematous/infected gallbladder.  No biliary ductal dilatation on initial ultrasound.  Patient has not been well enough for good-quality MRCP, and his LFTs were slowly decreasing.   Plan: I will order hepatic function panel for tomorrow morning.  I expect they will remain modestly elevated, perhaps for weeks to come.  If he has a significant increase, especially alk phosphatase and bilirubin, please alert [place].  Please check hepatic function panel every 3 days while hospitalized, sooner if he has jaundice or icterus.  Advance diet and eventually remove core track tube per critical care team, in consultation with surgical service as needed.  GI service signing off, please call as needed.  (His wife was at the bedside and all questions answered)   Nelida Meuse III Office: [phone]

## 2020-11-16 NOTE — Discharge Instructions (Signed)
Cholecystostomy Cholecystostomy is a procedure to drain fluid from the gallbladder by using a flexible drainage tube (catheter). The gallbladder is a pear-shaped organ that lies beneath the liver on the right side of the body. The gallbladder stores bile, which is a fluid that helps the body digest fats. You may have this procedure:  If your gallbladder is infected due to gallstones (cholecystitis).  To control a gallbladder infection if you cannot have gallbladder surgery. Tell a health care provider about:  Any allergies you have.  All medicines you are taking, including vitamins, herbs, eye drops, creams, and over-the-counter medicines.  Any problems you or family members have had with anesthetic medicines.  Any blood disorders you have.  Any surgeries you have had.  Any medical conditions you have.  Whether you are pregnant or may be pregnant. What are the risks? Generally, this is a safe procedure. However, problems may occur, including:  The catheter moving out of place.  Clogging of the catheter.  Infection of the incision site.  Internal bleeding.  Leakage of bile from the gallbladder.  Infection inside the abdomen (peritonitis).  Damage to other structures or organs.  Low blood pressure and slowed heart rate.  Allergic reactions to medicines or dyes. What happens before the procedure? Most often, you will already be in the hospital receiving treatment for a gallbladder infection or other problems with the gallbladder. Staying hydrated Follow instructions from your health care provider about hydration, which may include:  Up to 2 hours before the procedure - you may continue to drink clear liquids, such as water, clear fruit juice, black coffee, and plain tea.   Eating and drinking Follow instructions from your health care provider about eating and drinking, which may include:  8 hours before the procedure - stop eating heavy meals or foods, such as meat,  fried foods, or fatty foods.  6 hours before the procedure - stop eating light meals or foods, such as toast or cereal.  6 hours before the procedure - stop drinking milk or drinks that contain milk.  2 hours before the procedure - stop drinking clear liquids. Medicines  Ask your health care provider about: ? Changing or stopping your regular medicines. This is especially important if you are taking diabetes medicines or blood thinners. ? Taking medicines such as aspirin and ibuprofen. These medicines can thin your blood. Do not take these medicines unless your health care provider tells you to take them. ? Taking over-the-counter medicines, vitamins, herbs, and supplements. General instructions  You may have an exam or testing, including: ? Imaging studies of your gallbladder. ? Blood tests.  Do not use any products that contain nicotine or tobacco before the procedure. These products include cigarettes, e-cigarettes, and chewing tobacco. If you need help quitting, ask your health care provider. Also, do not use these products after the procedure.  Plan to have someone take you home from the hospital or clinic.  If you will be going home right after the procedure, plan to have someone with you for 24 hours.  Ask your health care provider how your surgical site will be marked or identified.  Ask your health care provider what steps will be taken to help prevent infection. These may include: ? Removing hair at the surgery site. ? Washing skin with a germ-killing soap. ? Taking antibiotic medicine. What happens during the procedure?  An IV will be inserted into one of your veins.  You will be given one or more of  the following: ? A medicine to help you relax (sedative). ? A medicine to numb the area (local anesthetic).  A small incision will be made in your abdomen.  A long needle or a wide puncturing tool (trocar) will be put through the incision.  Your health care provider  will use an imaging study (ultrasound) to guide the needle or trocar into your gallbladder.  After the needle or trocar is in your gallbladder, a small amount of dye may be injected. An X-ray may be taken to make sure that the needle is in the correct place.  A catheter will be placed through the needle.  The needle or trocar will be removed.  The catheter will be secured to your skin with stitches (sutures).  The catheter will be connected to a drainage bag. Fluid will drain from the gallbladder into the bag. Some of this bile may be sent to the lab to be tested.  A bandage (dressing) will be placed over the incision site where the catheter was placed. The procedure may vary among health care providers and hospitals. What happens after the procedure?  Your blood pressure, heart rate, breathing rate, and blood oxygen level will be monitored until you leave the hospital or clinic.  Dye may be injected through your catheter to check the catheter and your gallbladder.  Your gallbladder may be flushed out (irrigated) through the catheter.  Your catheter and drainage bag may need to stay in place for several weeks or as told by your health care provider. Summary  Cholecystostomy is a procedure to drain fluid from the gallbladder using a flexible drainage tube (catheter).  Generally, this is a safe procedure. However, problems may occur, including bleeding, infection, clogging of the catheter, damage to other structures or organs, or low blood pressure and slowed heart rate.  Follow instructions before the procedure. You will be told when to stop all food and drink, whether to change or stop any medicines, and what tests need to be done.  After the procedure, you will be monitored in the hospital, and you may have a catheter and drainage bag when you go home. This information is not intended to replace advice given to you by your health care provider. Make sure you discuss any questions you  have with your health care provider. Document Revised: 01/04/2018 Document Reviewed: 01/04/2018 Elsevier Patient Education  2021 [place] [employer] This sheet gives you information about how to care for yourself after your procedure. Your health care provider may also give you more specific instructions. If you have problems or questions, contact your health care provider. What can I expect after the procedure? After your procedure, it is common to have soreness near the incision site of your drainage tube (catheter). Follow these instructions at home: Incision care  Follow instructions from your health care provider about how to take care of your incision site where the catheter was inserted. Make sure you: ? Wash your hands with soap and water before and after you change your bandage (dressing). If soap and water are not available, use hand sanitizer. ? Change your dressing as told by your health care provider.  Check the incision site every day for signs of infection. Check for: ? Redness, swelling, or pain. ? Fluid or blood. ? Warmth. ? Pus or a bad smell.  Do not take baths, swim, or use a hot tub until your health care provider approves. Ask your health care provider if you  may take showers. You may only be allowed to take sponge baths.   General instructions  Follow instructions from your health care provider about how to care for your catheter and collection bag at home.  Your health care provider will show you: ? How to record the amount of drainage from the catheter. ? How to flush the catheter. ? How to care for the catheter incision site.  Follow instructions from your health care provider about eating or drinking restrictions.  Take over-the-counter and prescription medicines only as told by your health care provider.  Keep all follow-up visits as told by your health care provider. This is important. Contact a health care provider if:  You  have redness, swelling, or pain around the catheter incision site.  You have nausea or vomiting. Get help right away if:  Your abdominal pain gets worse.  You feel dizzy or you faint while standing.  You have fluid or blood coming from the catheter incision site.  The area around the catheter incision site feels warm to the touch.  You have pus or a bad smell coming from the catheter incision site.  You have a fever.  You have shortness of breath.  You have a rapid heartbeat.  Your nausea or vomiting does not go away.  Your catheter becomes blocked.  Your catheter comes out of your abdomen. Summary  After your procedure, it is common to have soreness near the incision site of your drainage tube (catheter).  Wash your hands with soap and water before and after you change your bandage (dressing). Change your dressing as told by your health care provider.  Check the catheter incision site every day for signs of infection. Check for redness, swelling, pain, fluid, blood, warmth, pus, or a bad smell.  Contact your health care provider if you have nausea or vomiting, or if you have redness, swelling, or pain around your catheter incision site.  Get help right away if your abdominal pain gets worse, you feel dizzy, you have blood or fluid coming from the catheter incision site, you have a fever, or you have shortness of breath. This information is not intended to replace advice given to you by your health care provider. Make sure you discuss any questions you have with your health care provider. Document Revised: 01/04/2018 Document Reviewed: 01/04/2018 Elsevier Patient Education  [address] on my medicine - ELIQUIS (apixaban)  Why was Eliquis prescribed for you? Eliquis was prescribed for you to reduce the risk of a blood clot forming that can cause a stroke if you have a medical condition called atrial fibrillation (a type of irregular  heartbeat).  What do You need to know about Eliquis ? Take your Eliquis TWICE DAILY - one tablet in the morning and one tablet in the evening with or without food. If you have difficulty swallowing the tablet whole please discuss with your pharmacist how to take the medication safely.  Take Eliquis exactly as prescribed by your doctor and DO NOT stop taking Eliquis without talking to the doctor who prescribed the medication.  Stopping may increase your risk of developing a stroke.  Refill your prescription before you run out.  After discharge, you should have regular check-up appointments with your healthcare provider that is prescribing your Eliquis.  In the future your dose may need to be changed if your kidney function or weight changes by a significant amount or as you get older.  What do you do if  you miss a dose? If you miss a dose, take it as soon as you remember on the same day and resume taking twice daily.  Do not take more than one dose of ELIQUIS at the same time to make up a missed dose.  Important Safety Information A possible side effect of Eliquis is bleeding. You should call your healthcare provider right away if you experience any of the following: ? Bleeding from an injury or your nose that does not stop. ? Unusual colored urine (red or dark brown) or unusual colored stools (red or black). ? Unusual bruising for unknown reasons. ? A serious fall or if you hit your head (even if there is no bleeding).  Some medicines may interact with Eliquis and might increase your risk of bleeding or clotting while on Eliquis. To help avoid this, consult your healthcare provider or pharmacist prior to using any new prescription or non-prescription medications, including herbals, vitamins, non-steroidal anti-inflammatory drugs (NSAIDs) and supplements.  This website has more information on Eliquis (apixaban): http://www.eliquis.com/eliquis/home

## 2020-11-16 NOTE — Progress Notes (Signed)
Subjective: Continues to look better.  Off CRRT today.  Ate some eggs and toast this morning.  Otherwise getting TFs as well.  Denies abdominal pain.  tmax 101  ROS: see above  Objective: Vital signs in last 24 hours: Temp:  [98.2 F (36.8 C)-101.6 F (38.7 C)] 98.2 F (36.8 C) (05/27 0330) Pulse Rate:  [85-112] 92 (05/27 0600) Resp:  [7-32] 24 (05/27 0600) BP: (84-138)/(50-68) 98/62 (05/27 0600) SpO2:  [89 %-96 %] 96 % (05/27 0600) Weight:  PE:5023248 kg] 114 kg (05/27 0500) Last BM Date: 11/15/20  Intake/Output from previous day: 05/26 0701 - 05/27 0700 In: 2733.2 [I.V.:219.3; AA:340493; IV Piggyback:1096.5] Out: 1710 [Urine:10; Drains:200] Intake/Output this shift: No intake/output data recorded.  PE: Abd: soft, NT, ND, +BS, perc chole drain in place with clear output today   Lab Results:  Recent Labs    11/15/20 0500 11/16/20 0500  WBC 17.1* 13.0*  HGB 8.9* 8.4*  HCT 24.2* 23.4*  PLT 96* 107*   BMET Recent Labs    11/15/20 0500 11/16/20 0500  NA 136 135  K 3.2* 2.9*  CL 102 103  CO2 19* 24  GLUCOSE 164* 164*  BUN 60* 50*  CREATININE 5.76* 4.93*  CALCIUM 8.3* 7.9*   PT/INR No results for input(s): LABPROT, INR in the last 72 hours. CMP     Component Value Date/Time   NA 135 11/16/2020 0500   K 2.9 (L) 11/16/2020 0500   CL 103 11/16/2020 0500   CO2 24 11/16/2020 0500   GLUCOSE 164 (H) 11/16/2020 0500   BUN 50 (H) 11/16/2020 0500   CREATININE 4.93 (H) 11/16/2020 0500   CALCIUM 7.9 (L) 11/16/2020 0500   PROT 5.6 (L) 11/13/2020 0657   ALBUMIN 1.9 (L) 11/16/2020 0500   AST 31 11/13/2020 0657   ALT 70 (H) 11/13/2020 0657   ALKPHOS 177 (H) 11/13/2020 0657   BILITOT 2.6 (H) 11/13/2020 0657   GFRNONAA 12 (L) 11/16/2020 0500   Lipase     Component Value Date/Time   LIPASE 27 11/09/2020 0358       Studies/Results: DG Abd Portable 1V  Result Date: 11/14/2020 CLINICAL DATA:  Feeding tube placement. EXAM: PORTABLE ABDOMEN - 1 VIEW  COMPARISON:  Same day. FINDINGS: Distal tip of feeding tube is seen in expected position of proximal jejunum in patient status post gastrojejunostomy. Percutaneous cholecystostomy is again noted. IMPRESSION: Distal tip of feeding tube seen in expected position of proximal jejunum. Electronically Signed   By: Marijo Conception M.D.   On: 11/14/2020 15:58   DG Abd Portable 1V  Result Date: 11/14/2020 CLINICAL DATA:  Feeding tube placement, history of gastric bypass EXAM: PORTABLE ABDOMEN - 1 VIEW COMPARISON:  CT abdomen/pelvis dated 11/09/2020 FINDINGS: Weighted feeding tube in the left upper abdomen adjacent to surgical clips. It is unclear radiographically whether this is within the gastric pouch or within the jejunum distal to the gastrojejunostomy, although the latter is favored when correlating with prior CT. Pigtail drainage catheter in the right upper abdomen, related to percutaneous cholecystostomy. Degenerative changes of the lumbar spine. IMPRESSION: Weighted feeding tube in the left upper abdomen, likely within the jejunum distal to the gastrojejunostomy, less likely within the gastric pouch. Status post percutaneous cholecystostomy. Electronically Signed   By: Julian Hy M.D.   On: 11/14/2020 13:15    Anti-infectives: Anti-infectives (From admission, onward)   Start     Dose/Rate Route Frequency Ordered Stop   11/13/20 2200  meropenem (  MERREM) 500 mg in sodium chloride 0.9 % 100 mL IVPB        500 mg 200 mL/hr over 30 Minutes Intravenous Every 24 hours 11/13/20 0916     11/12/20 2200  meropenem (MERREM) 1 g in sodium chloride 0.9 % 100 mL IVPB  Status:  Discontinued        1 g 200 mL/hr over 30 Minutes Intravenous Every 24 hours 11/12/20 0742 11/13/20 0916   11/10/20 2300  meropenem (MERREM) 1 g in sodium chloride 0.9 % 100 mL IVPB  Status:  Discontinued        1 g 200 mL/hr over 30 Minutes Intravenous Every 12 hours 11/10/20 2207 11/12/20 0742   11/08/20 1600  ceFEPIme (MAXIPIME) 2  g in sodium chloride 0.9 % 100 mL IVPB  Status:  Discontinued        2 g 200 mL/hr over 30 Minutes Intravenous Every 12 hours 11/08/20 1456 11/10/20 2207   11/08/20 1500  metroNIDAZOLE (FLAGYL) IVPB 500 mg  Status:  Discontinued        500 mg 100 mL/hr over 60 Minutes Intravenous Every 8 hours 11/08/20 1404 11/10/20 2213       Assessment/Plan E coli bacteremia - abx per primary ARF - on HD DM A fib HTN Thrombocytopenia - improving Diffuse body pain  Acute cholecystitis -s/p perc chole drain by IR -perc drain will need to remain in place 6-8 weeks at least. -patient is stable with his drain in place from a surgical standpoint.  We will have him follow up with Dr. Brantley Stage in 6 weeks for follow up.  He will also need to see IR in 4-5 weeks as well for drain assessment. -we will sign off at this time.  Please call back if questions or needs arise.  FEN - cortrak/regular diet VTE - heparin ID - merrem   LOS: 8 days    Henreitta Cea , [facility] 11/16/2020, 9:38 AM Please see Amion for pager number during day hours 7:00am-4:30pm or 7:00am -11:30am on weekends

## 2020-11-17 LAB — GLUCOSE, CAPILLARY
Glucose-Capillary: 158 mg/dL — ABNORMAL HIGH (ref 70–99)
Glucose-Capillary: 168 mg/dL — ABNORMAL HIGH (ref 70–99)
Glucose-Capillary: 175 mg/dL — ABNORMAL HIGH (ref 70–99)
Glucose-Capillary: 178 mg/dL — ABNORMAL HIGH (ref 70–99)
Glucose-Capillary: 206 mg/dL — ABNORMAL HIGH (ref 70–99)
Glucose-Capillary: 234 mg/dL — ABNORMAL HIGH (ref 70–99)

## 2020-11-17 LAB — CBC
HCT: 21.5 % — ABNORMAL LOW (ref 39.0–52.0)
Hemoglobin: 7.7 g/dL — ABNORMAL LOW (ref 13.0–17.0)
MCH: 26.8 pg (ref 26.0–34.0)
MCHC: 35.8 g/dL (ref 30.0–36.0)
MCV: 74.9 fL — ABNORMAL LOW (ref 80.0–100.0)
Platelets: 128 10*3/uL — ABNORMAL LOW (ref 150–400)
RBC: 2.87 MIL/uL — ABNORMAL LOW (ref 4.22–5.81)
RDW: 20.2 % — ABNORMAL HIGH (ref 11.5–15.5)
WBC: 11.3 10*3/uL — ABNORMAL HIGH (ref 4.0–10.5)
nRBC: 0.2 % (ref 0.0–0.2)

## 2020-11-17 LAB — RENAL FUNCTION PANEL
Albumin: 1.7 g/dL — ABNORMAL LOW (ref 3.5–5.0)
Anion gap: 9 (ref 5–15)
BUN: 66 mg/dL — ABNORMAL HIGH (ref 8–23)
CO2: 22 mmol/L (ref 22–32)
Calcium: 7.8 mg/dL — ABNORMAL LOW (ref 8.9–10.3)
Chloride: 102 mmol/L (ref 98–111)
Creatinine, Ser: 5.9 mg/dL — ABNORMAL HIGH (ref 0.61–1.24)
GFR, Estimated: 10 mL/min — ABNORMAL LOW (ref >60)
Glucose, Bld: 197 mg/dL — ABNORMAL HIGH (ref 70–99)
Phosphorus: 3.7 mg/dL (ref 2.5–4.6)
Potassium: 3.1 mmol/L — ABNORMAL LOW (ref 3.5–5.1)
Sodium: 133 mmol/L — ABNORMAL LOW (ref 135–145)

## 2020-11-17 LAB — IRON AND TIBC
Iron: 27 ug/dL — ABNORMAL LOW (ref 45–182)
Saturation Ratios: 14 % — ABNORMAL LOW (ref 17.9–39.5)
TIBC: 199 ug/dL — ABNORMAL LOW (ref 250–450)
UIBC: 172 ug/dL

## 2020-11-17 LAB — HEPATIC FUNCTION PANEL
ALT: 58 U/L — ABNORMAL HIGH (ref 0–44)
AST: 157 U/L — ABNORMAL HIGH (ref 15–41)
Albumin: 1.7 g/dL — ABNORMAL LOW (ref 3.5–5.0)
Alkaline Phosphatase: 190 U/L — ABNORMAL HIGH (ref 38–126)
Bilirubin, Direct: 0.4 mg/dL — ABNORMAL HIGH (ref 0.0–0.2)
Indirect Bilirubin: 1.1 mg/dL — ABNORMAL HIGH (ref 0.3–0.9)
Total Bilirubin: 1.5 mg/dL — ABNORMAL HIGH (ref 0.3–1.2)
Total Protein: 5.3 g/dL — ABNORMAL LOW (ref 6.5–8.1)

## 2020-11-17 LAB — AMMONIA: Ammonia: 27 umol/L (ref 9–35)

## 2020-11-17 LAB — FERRITIN: Ferritin: 181 ng/mL (ref 24–336)

## 2020-11-17 MED ORDER — DARBEPOETIN ALFA 40 MCG/0.4ML IJ SOSY
PREFILLED_SYRINGE | INTRAMUSCULAR | Status: AC
  Administered 2020-11-17: 40 ug via SUBCUTANEOUS
  Filled 2020-11-17: qty 0.4

## 2020-11-17 MED ORDER — DARBEPOETIN ALFA 40 MCG/0.4ML IJ SOSY
40.0000 ug | PREFILLED_SYRINGE | INTRAMUSCULAR | Status: DC
Start: 2020-11-17 — End: 2020-11-23
  Filled 2020-11-17: qty 0.4

## 2020-11-17 MED ORDER — HEPARIN SODIUM (PORCINE) 1000 UNIT/ML IJ SOLN
INTRAMUSCULAR | Status: AC
  Administered 2020-11-17: 1000 [IU]
  Filled 2020-11-17: qty 3

## 2020-11-17 MED ORDER — ALBUMIN HUMAN 25 % IV SOLN
INTRAVENOUS | Status: AC
  Administered 2020-11-17: 25 g
  Filled 2020-11-17: qty 100

## 2020-11-17 MED ORDER — POTASSIUM CHLORIDE CRYS ER 10 MEQ PO TBCR
10.0000 meq | EXTENDED_RELEASE_TABLET | Freq: Once | ORAL | Status: AC
  Administered 2020-11-17: 10 meq via ORAL
  Filled 2020-11-17: qty 1

## 2020-11-17 NOTE — Progress Notes (Signed)
Upon assessment, patient's Cort[place] tube was laying in his bed. I asked patient if he knew it was out and he said yes, he pulled it out. Patient stated that he did not want it in. Patient tolerated 75% of his supper this evening. Will continue to encourage PO intake. On-call doctor notified via Amnion. Wife made aware when she called for update, inquired as to whether it will be replaced. I informed her that I did not know at that time. Will continue to monitor.

## 2020-11-17 NOTE — Progress Notes (Signed)
Receive pt from HD. Call button in reach. Wife place dinner and breakfast order after encouraging pt need to tried to eat.  Bladder scan done and Dr. Royce Macadamia notified.

## 2020-11-17 NOTE — Progress Notes (Signed)
PROGRESS NOTE    Tom Scott  [id number] DOB: 1956/02/11 DOA: 11/08/2020 PCP: Patient, No Pcp Per (Inactive)   Brief Narrative: Tom Scott is a 65 y.o. male with a history of atrial fibrillation, hypertension, diabetes mellitus, hyperlipidemia.  Patient initially presented to [facility] in the hospital secondary to chest pain and epigastric pain with concern for choledocholithiasis.  He was transferred to [facility] for ERCP and due to septic shock requiring vasopressor support.  He was admitted to the ICU.  General surgery and gastroenterology were consulted for management of his acute calculus cholecystitis.  IR consulted for percutaneous cholecystostomy and patient managed with empiric antibiotics.  While admitted, patient developed ATN requiring initiation of hemodialysis.  Also while admitted, patient was found to have evidence of ESBL E. coli bacteremia from outside hospital report.  Patient's antibiotics have been transitioned to meropenem to treat his bacteremia and cholecystitis.   Assessment & Plan:   Principal Problem:   Septic shock ([place]) Active Problems:   Pressure injury of skin   Protein-calorie malnutrition, severe   Encounter for central line placement   AKI (acute kidney injury) ([place])   Septic shock Present on admission.  Secondary to cholecystitis and bacteremia. Patient required ICU admission with vasopressor support. Weaned off vasopressors and currently stable.  Acute calculus cholecystitis General surgery and IR consulted. IR placed percutaneous cholecystostomy on 5/20. LFTs with initial downtrend. AST slightly elevated -Hepatic function panel in AM  ESBL E. Coli bacteremia Report received from OSH per PCCM that reports ESBL bacteremia and patient started on meropenem IV. -Continue Meropenem -ID consult on Monday for antibiotic duration recommendations  Acute metabolic encephalopathy Secondary to uremia. Resolved.  AKI Secondary to ATN  secondary to septic shock. Patient started on hemodialysis. Poor urine output. -Nephrology recommendations: TTS schedule for HD  Atrial fibrillation with RVR Patient started on amiodarone for control. Currently rate controlled. On Eliquis for stroke prevention -Continue amiodarone and Eliquis  Asymptomatic anemia Patient with unknown baseline. Hemoglobin of 11 on admission with slow downtrend. No obvious source of bleeding. -CBC in AM  Anasarca Secondary to fluid resuscitation with IV fluids in setting of kidney failure in addition to albuminemia.  Primary hypertension Patient is on amlodipine, losartan and metoprolol as an outpatient. Antihypertensives held secondary to hypotension and septic shock. Blood pressure now better controlled and stable. -If worsening control, can restart regimen  Diabetes mellitus, type 2 Patient is on metformin as an outpatient. -SSI  Hyperlipidemia -Continue Crestor  Severe malnutrition History of gastric bypass. Worse secondary to critical illness. NG tube inserted on 5/25 and patient started on tube feeds on 5/25. -Encourage oral intake in attempts to wean NG tube feds -Nutrition recommendations for tube feeds  Pressure injury Medial coccyx, not POA   DVT prophylaxis: Eliquis Code Status:   Code Status: Full Code Family Communication: Wife at bedside Disposition Plan: Discharge likely in several days pending management of AKI, transition off tube feeds, PT/OT   Consultants:   PCCM  Gastroenterology  General surgery  Interventional radiology  Nephrology  Procedures:     Antimicrobials:  Flagyl  Cefepime  Meropenem    Subjective: No issues this morning. Has to have a bowel movement. No urine output to his knowledge. Eager to get out of bed to mobilize  Objective: Vitals:   11/17/20 1355 11/17/20 1401 11/17/20 1430 11/17/20 1500  BP: (!) 100/57 118/62 100/63 (!) 119/55  Pulse: 93 96 89 92  Resp: (!) 29 (!) 30 (!)  23  20  Temp: 99.8 F (37.7 C)     TempSrc: Oral     SpO2: 94%     Weight: 120.5 kg     Height:        Intake/Output Summary (Last 24 hours) at 11/17/2020 1528 Last data filed at 11/17/2020 [id number] Gross per 24 hour  Intake 1249.82 ml  Output 200 ml  Net 1049.82 ml   Filed Weights   11/16/20 0500 11/17/20 0447 11/17/20 1355  Weight: 114 kg 114 kg 120.5 kg    Examination:  General exam: Appears calm and comfortable Respiratory system: Clear to auscultation. Respiratory effort normal. Cardiovascular system: S1 & S2 heard. No murmurs, rubs, gallops or clicks. Gastrointestinal system: Abdomen is nondistended, edematous, soft and nontender. No organomegaly or masses felt. Normal bowel sounds heard. Central nervous system: Alert and oriented. No focal neurological deficits. Musculoskeletal: BLE pitting edema. No calf tenderness Skin: No cyanosis. No rashes Psychiatry: Judgement and insight appear normal. Mood & affect appropriate.     Data Reviewed: I have personally reviewed following labs and imaging studies  CBC Lab Results  Component Value Date   WBC 11.3 (H) 11/17/2020   RBC 2.87 (L) 11/17/2020   HGB 7.7 (L) 11/17/2020   HCT 21.5 (L) 11/17/2020   MCV 74.9 (L) 11/17/2020   MCH 26.8 11/17/2020   PLT 128 (L) 11/17/2020   MCHC 35.8 11/17/2020   RDW 20.2 (H) 0000000     Last metabolic panel Lab Results  Component Value Date   NA 133 (L) 11/17/2020   K 3.1 (L) 11/17/2020   CL 102 11/17/2020   CO2 22 11/17/2020   BUN 66 (H) 11/17/2020   CREATININE 5.90 (H) 11/17/2020   GLUCOSE 197 (H) 11/17/2020   GFRNONAA 10 (L) 11/17/2020   CALCIUM 7.8 (L) 11/17/2020   PHOS 3.7 11/17/2020   PROT 5.3 (L) 11/17/2020   ALBUMIN 1.7 (L) 11/17/2020   ALBUMIN 1.7 (L) 11/17/2020   BILITOT 1.5 (H) 11/17/2020   ALKPHOS 190 (H) 11/17/2020   AST 157 (H) 11/17/2020   ALT 58 (H) 11/17/2020   ANIONGAP 9 11/17/2020    CBG (last 3)  Recent Labs    11/17/20 0231 11/17/20 0751  11/17/20 1200  GLUCAP 206* 175* 168*     GFR: Estimated Creatinine Clearance: 15.8 mL/min (A) (by C-G formula based on SCr of 5.9 mg/dL (H)).  Coagulation Profile: No results for input(s): INR, PROTIME in the last 168 hours.  Recent Results (from the past 240 hour(s))  MRSA PCR Screening     Status: None   Collection Time: 11/08/20  5:28 PM   Specimen: Nasal Mucosa; Nasopharyngeal  Result Value Ref Range Status   MRSA by PCR NEGATIVE NEGATIVE Final    Comment:        The GeneXpert MRSA Assay (FDA approved for NASAL specimens only), is one component of a comprehensive MRSA colonization surveillance program. It is not intended to diagnose MRSA infection nor to guide or monitor treatment for MRSA infections. Performed at [facility], [place] [address], [place], [place] [address]   Culture, blood (routine x 2)     Status: None   Collection Time: 11/09/20  2:30 AM   Specimen: BLOOD LEFT HAND  Result Value Ref Range Status   Specimen Description BLOOD LEFT HAND  Final   Special Requests AEROBIC BOTTLE ONLY Blood Culture adequate volume  Final   Culture   Final    NO GROWTH 5 DAYS Performed at [facility]  Lab, [address], [place], [place] [address]    Report Status 11/14/2020 FINAL  Final  Culture, blood (routine x 2)     Status: None   Collection Time: 11/09/20  2:35 AM   Specimen: BLOOD RIGHT HAND  Result Value Ref Range Status   Specimen Description BLOOD RIGHT HAND  Final   Special Requests AEROBIC BOTTLE ONLY Blood Culture adequate volume  Final   Culture   Final    NO GROWTH 5 DAYS Performed at [facility], [place] [address] [place], [place] [address]    Report Status 11/14/2020 FINAL  Final  Aerobic/Anaerobic Culture (surgical/deep wound)     Status: None   Collection Time: 11/09/20 12:28 PM   Specimen: Abscess  Result Value Ref Range Status   Specimen Description ABSCESS GALL BLADDER  Final   Special Requests NONE  Final   Gram Stain   Final     FEW WBC PRESENT, PREDOMINANTLY MONONUCLEAR FEW GRAM VARIABLE ROD FEW GRAM POSITIVE COCCI    Culture   Final    ABUNDANT ESCHERICHIA COLI Confirmed Extended Spectrum Beta-Lactamase Producer (ESBL).  In bloodstream infections from ESBL organisms, carbapenems are preferred over piperacillin/tazobactam. They are shown to have a lower risk of mortality. RARE BACTEROIDES FRAGILIS BETA LACTAMASE POSITIVE Performed at [facility], [place] [address], [place], [place] [address]    Report Status 11/12/2020 FINAL  Final   Organism ID, Bacteria ESCHERICHIA COLI  Final      Susceptibility   Escherichia coli - MIC*    AMPICILLIN >=32 RESISTANT Resistant     CEFAZOLIN >=64 RESISTANT Resistant     CEFEPIME 2 SENSITIVE Sensitive     CEFTAZIDIME RESISTANT Resistant     CEFTRIAXONE >=64 RESISTANT Resistant     CIPROFLOXACIN >=4 RESISTANT Resistant     GENTAMICIN <=1 SENSITIVE Sensitive     IMIPENEM <=0.25 SENSITIVE Sensitive     TRIMETH/SULFA >=320 RESISTANT Resistant     AMPICILLIN/SULBACTAM 4 SENSITIVE Sensitive     PIP/TAZO <=4 SENSITIVE Sensitive     * ABUNDANT ESCHERICHIA COLI        Radiology Studies: No results found.      Scheduled Meds: . amiodarone  200 mg Per Tube Daily  . apixaban  5 mg Per Tube BID  . calcium carbonate  1 tablet Per Tube TID  . Chlorhexidine Gluconate Cloth  6 each Topical Q0600  . cholecalciferol  1,000 Units Per Tube Daily  . darbepoetin (ARANESP) injection - NON-DIALYSIS  40 mcg Subcutaneous Q Sat-1800  . feeding supplement  237 mL Oral BID BM  . feeding supplement (PROSource TF)  45 mL Per Tube TID  . insulin aspart  0-15 Units Subcutaneous Q4H  . lidocaine  1 patch Transdermal Q24H  . multivitamin with minerals  1 tablet Per Tube BID  . rosuvastatin  10 mg Per Tube Daily  . sodium chloride flush  10-40 mL Intracatheter Q12H  . sodium chloride flush  5 mL Intracatheter Q8H  . zinc sulfate  220 mg Per Tube Daily   Continuous Infusions: .  sodium chloride    . feeding supplement (VITAL 1.5 CAL) 1,000 mL (11/17/20 1141)  . meropenem (MERREM) IV Stopped (11/17/20 [id number])     LOS: 9 days    Cordelia Poche, MD [facility] 11/17/2020, 3:28 PM  If 7PM-7AM, please contact night-coverage [email or web address]

## 2020-11-17 NOTE — Progress Notes (Addendum)
Referring Physician(s): Dr. Franco Collet / Dr. Jeanmarie Hubert  Supervising Physician: Sandi Mariscal  Patient Status:    Chief Complaint:  Acute calculus cholecystitis with choledocholithiasis  s/p cholecystomy tube on 5.20.22 by Dr. Annamaria Boots.   Subjective:  Patient seen at bedside with wife present. Patient states overall his condition has improved and  he is feeling stronger.  Allergies: Penicillamine and Penicillins  Medications: Prior to Admission medications   Medication Sig Start Date End Date Taking? Authorizing Provider  amLODipine (NORVASC) 10 MG tablet Take 10 mg by mouth every morning. 10/18/20  Yes [provider]  aspirin EC 81 MG tablet Take 162 mg by mouth daily. Swallow whole.   Yes [provider]  furosemide (LASIX) 20 MG tablet Take 40 mg by mouth daily. 09/18/20  Yes [provider]  hydrOXYzine (ATARAX/VISTARIL) 10 MG tablet Take 10-20 mg by mouth at bedtime. 10/29/20  Yes [provider]  irbesartan (AVAPRO) 150 MG tablet Take 150 mg by mouth daily. 08/30/20  Yes [provider]  metFORMIN (GLUCOPHAGE) 500 MG tablet Take 1,000 mg by mouth 2 (two) times daily. 09/14/20  Yes [provider]  metoprolol succinate (TOPROL-XL) 50 MG 24 hr tablet Take 50 mg by mouth every morning. 08/11/20  Yes [provider]  NONFORMULARY OR COMPOUNDED ITEM Apply 1 application topically 2 (two) times daily. TAC 0.1% cream + Hydrophor c&m 0.5% No more than a week at a time 08/13/20  Yes [provider]  rosuvastatin (CRESTOR) 10 MG tablet Take 10 mg by mouth at bedtime. 09/06/20  Yes [provider]     Vital Signs: BP 112/66 (BP Location: Left Arm)   Pulse 75   Temp 98.1 F (36.7 C) (Oral)   Resp (!) 21   Ht '5\' 10"'$  (1.778 m)   Wt 251 lb 5.2 oz (114 kg)   SpO2 90%   BMI 36.06 kg/m   Physical Exam Vitals and nursing note reviewed.  Constitutional:      Appearance: He is well-developed.  HENT:     Head:  Normocephalic.  Pulmonary:     Effort: Pulmonary effort is normal.  Abdominal:     Comments: Positive RUQ drain to  gravity bag.Ssite is unremarkable with no erythema, edema, tenderness, bleeding or drainage noted at exit site. Suture and stat lock in place. Dressing is clean dry and intact. 50 ml of clear with pink tinge colored fluid noted in gravity bag. Drain is able to be flushed easily.   Musculoskeletal:        General: Normal range of motion.     Cervical back: Normal range of motion.  Skin:    General: Skin is dry.  Neurological:     Mental Status: He is alert and oriented to person, place, and time.     Imaging: DG Abd Portable 1V  Result Date: 11/14/2020 CLINICAL DATA:  Feeding tube placement. EXAM: PORTABLE ABDOMEN - 1 VIEW COMPARISON:  Same day. FINDINGS: Distal tip of feeding tube is seen in expected position of proximal jejunum in patient status post gastrojejunostomy. Percutaneous cholecystostomy is again noted. IMPRESSION: Distal tip of feeding tube seen in expected position of proximal jejunum. Electronically Signed   By: Marijo Conception M.D.   On: 11/14/2020 15:58   DG Abd Portable 1V  Result Date: 11/14/2020 CLINICAL DATA:  Feeding tube placement, history of gastric bypass EXAM: PORTABLE ABDOMEN - 1 VIEW COMPARISON:  CT abdomen/pelvis dated 11/09/2020 FINDINGS: Weighted feeding tube in the  left upper abdomen adjacent to surgical clips. It is unclear radiographically whether this is within the gastric pouch or within the jejunum distal to the gastrojejunostomy, although the latter is favored when correlating with prior CT. Pigtail drainage catheter in the right upper abdomen, related to percutaneous cholecystostomy. Degenerative changes of the lumbar spine. IMPRESSION: Weighted feeding tube in the left upper abdomen, likely within the jejunum distal to the gastrojejunostomy, less likely within the gastric pouch. Status post percutaneous cholecystostomy. Electronically Signed    By: Julian Hy M.D.   On: 11/14/2020 13:15    Labs:  CBC: Recent Labs    11/14/20 0350 11/15/20 0500 11/16/20 0500 11/17/20 0310  WBC 17.5* 17.1* 13.0* 11.3*  HGB 8.4* 8.9* 8.4* 7.7*  HCT 24.3* 24.2* 23.4* 21.5*  PLT 73* 96* 107* 128*    COAGS: Recent Labs    11/08/20 1426  INR 1.4*    BMP: Recent Labs    11/14/20 0652 11/15/20 0500 11/16/20 0500 11/17/20 0310  NA 136 136 135 133*  K 3.5 3.2* 2.9* 3.1*  CL 102 102 103 102  CO2 16* 19* 24 22  GLUCOSE 123* 164* 164* 197*  BUN 51* 60* 50* 66*  CALCIUM 8.0* 8.3* 7.9* 7.8*  CREATININE 5.06* 5.76* 4.93* 5.90*  GFRNONAA 12* 10* 12* 10*    LIVER FUNCTION TESTS: Recent Labs    11/11/20 0835 11/12/20 0927 11/13/20 0657 11/14/20 0652 11/15/20 0500 11/16/20 0500 11/17/20 0310  BILITOT 3.8* 2.9* 2.6*  --   --   --  1.5*  AST 64* 45* 31  --   --   --  157*  ALT 117* 89* 70*  --   --   --  58*  ALKPHOS 173* 170* 177*  --   --   --  190*  PROT 5.1* 5.3* 5.6*  --   --   --  5.3*  ALBUMIN 2.1* 2.0* 2.0* 1.8* 1.7* 1.9* 1.7*  1.7*    Assessment and Plan:  65  y.o. male inpatient. History of  a fib (on Xarelto), HTN, DM. Presented to OSH with  persistent and worsening chest and abdominal pain X 2 week.  Found to be in septic shock with acute calculus cholecystitis with choledocholithiasis. Patient was transferred to [facility] where he was found to be in a fib with RVR.  IR placed cholecystomy tube on 5.20.22 by Dr. Annamaria Boots.  Per EPIC output is 200 ml, 300 ml, 355 ml. Cultures from gallbladder shows abundant e coli predominately monocular. WBC is 11.3 (down trending). Patient is afebrile. RUQ drain to gravity bag. 50 ml of  fluid noted to be in the foley - gravity bag.Site is unremarkable with no erythema, edema, tenderness, bleeding or drainage noted at exit site. Dressing is clean dry and intact.   Cholecystomy tube drain will need to remain in place at least 4-6 weeks unless gallbladder surgically removed in interim;  continue tid drain flushes in house and once daily with 5 ml sterile normal saline as outpatient; will schedule f/u cholangiogram in 4-6 weeks   Continue current treatment plans as per general surgery  Electronically Signed: Jacqualine Mau, NP 11/17/2020, 8:00 AM   I spent a total of 15 Minutes at the patient's bedside AND on the patient's hospital floor or unit, greater than 50% of which was counseling/coordinating care for cholecystostomy tube

## 2020-11-17 NOTE — Progress Notes (Signed)
[facility] Progress Note  Name: Shmaya Macek MRN: [id number] DOB: Apr 04, 1956  Chief Complaint:  abd pain and chest pain  Subjective:  Last HD on 5/26 with 1.5 kg UF.  He was moved to the floor.  His wife is at bedside.  He had 200 ml uop over 5/27.  He has been more alert - a little sleepy now.  He was moved to a regular floor bed.  His wife and I and the patient discussed risks/benefits/indications for ESA and they agreed to receive ESA/aranesp.  Review of systems:    Denies shortness of breath Denies chest pain  Denies n/v  Feels like he has to urinate ----------- Background on consult:  Crystian Timbers is a 65 y.o. male, diabetes atrial fibrillation who presented to epigastric and chest pain.  Several days of abd pain which worsened in the 3 days before presentation.  He was transferred to [facility] for septic shock and concern for choledocolithiasis.  He was found to have septic shock due to calculus cholecystitis and gram negative bacteremia.  He is s/p percutaneous cholecystostomy on 5/20.  His course has been complicated by AKI.  Nephrology consulted for assistance with management of the same.  He had 150 mL uop over 5/22 and he had lasix 60 mg IV once on 5/22.  He has been on bicarb gtt.  He was on LR at 100 ml/hr 5/19-5/22.  He was on levo 5/19-5/20 and phenylephrine 5/19 - 5/22.  He had a CT abd/pelvis without contrast on 5/20 with no hydro.  Critical care has placed a nontunneled catheter for HD access.  His wife and daughter provided some history as he was not able to answer questions.  He said hi to his daughter but did not answer her questions, as well.    Intake/Output Summary (Last 24 hours) at 11/17/2020 1257 Last data filed at 11/17/2020 [id number] Gross per 24 hour  Intake 1249.82 ml  Output 200 ml  Net 1049.82 ml    Vitals:  Vitals:   11/16/20 1958 11/17/20 0004 11/17/20 0447 11/17/20 0500  BP: 101/60 105/61  112/66  Pulse: 83 80  75  Resp: 20 (!) 21  (!) 21   Temp: 100 F (37.8 C) 99.7 F (37.6 C)  98.1 F (36.7 C)  TempSrc: Oral Oral  Oral  SpO2: 95% 94%  90%  Weight:   114 kg   Height:         Physical Exam:    General: elderly male in bed  HEENT: NCAT Eyes: EOMI sclera anicteric Neck: supple trachea midline Heart: S1S2 ; no rub  Lungs: clear and unlabored at rest Abdomen: soft/nondistended; RUQ abd drain Extremities: 2+ pitting edema; no cyanosis or clubbing  Neuro: awake on arrival and oriented x 3  psych - calm; no agitation  GU no foley Access: left IJ nontunneled HD catheter   Medications reviewed   Labs:  BMP Latest Ref Rng & Units 11/17/2020 11/16/2020 11/15/2020  Glucose 70 - 99 mg/dL 197(H) 164(H) 164(H)  BUN 8 - 23 mg/dL 66(H) 50(H) 60(H)  Creatinine 0.61 - 1.24 mg/dL 5.90(H) 4.93(H) 5.76(H)  Sodium 135 - 145 mmol/L 133(L) 135 136  Potassium 3.5 - 5.1 mmol/L 3.1(L) 2.9(L) 3.2(L)  Chloride 98 - 111 mmol/L 102 103 102  CO2 22 - 32 mmol/L 22 24 19(L)  Calcium 8.9 - 10.3 mg/dL 7.8(L) 7.9(L) 8.3(L)     Assessment/Plan:   # AKI  - Secondary to ATN from septic shock; pt also  with afib with RVR.  On lasix and irbesartan at home.  No hydro on CT a/p.  Started HD on 5/23 pm after nontunneled catheter placed by critical care.  HD on 5/23 and 5/24 - HD today and per a TTS schedule for now. Assess needs daily  - When able get UA and up/cr ratio - reordered  - strict ins/outs - Will need to consult for tunneled catheter as currently has nontunneled catheter placed in ICU  # septic shock  - secondary to calculus cholecystitis  - abx per primary team - on meropenem  # Gram negative Bacteremia - abx per primary team - on meropenem - 5/20 blood cx here negative  # Metabolic acidosis  - setting of shock, AKI  - continue HD as per above  # Calculus cholecystitis  - s/p perc cholecystostomy 5/20   # Afib with RVR - on PO amio now   # Encephalopathy acute - multifactorial -metabolic derangements. Some  improvement with HD noted  - HD as above  - continue work -up per primary team  - given elevated LFT's would check ammonia - ordered  # Transaminitis  - improved from 5/19 but worsened since last check - per primary team - check ammonia as above  # Microcytic anemia  - Check iron stores  - Aranesp 40 mcg on 5/28 and every Sat.    Claudia Desanctis, MD 11/17/2020  1:17 PM

## 2020-11-18 DIAGNOSIS — K8001 Calculus of gallbladder with acute cholecystitis with obstruction: Secondary | ICD-10-CM

## 2020-11-18 DIAGNOSIS — R7989 Other specified abnormal findings of blood chemistry: Secondary | ICD-10-CM | POA: Diagnosis not present

## 2020-11-18 LAB — RENAL FUNCTION PANEL
Albumin: 1.9 g/dL — ABNORMAL LOW (ref 3.5–5.0)
Anion gap: 10 (ref 5–15)
BUN: 47 mg/dL — ABNORMAL HIGH (ref 8–23)
CO2: 25 mmol/L (ref 22–32)
Calcium: 7.8 mg/dL — ABNORMAL LOW (ref 8.9–10.3)
Chloride: 98 mmol/L (ref 98–111)
Creatinine, Ser: 4.59 mg/dL — ABNORMAL HIGH (ref 0.61–1.24)
GFR, Estimated: 13 mL/min — ABNORMAL LOW (ref >60)
Glucose, Bld: 165 mg/dL — ABNORMAL HIGH (ref 70–99)
Phosphorus: 3.8 mg/dL (ref 2.5–4.6)
Potassium: 3.4 mmol/L — ABNORMAL LOW (ref 3.5–5.1)
Sodium: 133 mmol/L — ABNORMAL LOW (ref 135–145)

## 2020-11-18 LAB — CBC
HCT: 22.1 % — ABNORMAL LOW (ref 39.0–52.0)
Hemoglobin: 7.4 g/dL — ABNORMAL LOW (ref 13.0–17.0)
MCH: 25.9 pg — ABNORMAL LOW (ref 26.0–34.0)
MCHC: 33.5 g/dL (ref 30.0–36.0)
MCV: 77.3 fL — ABNORMAL LOW (ref 80.0–100.0)
Platelets: 154 10*3/uL (ref 150–400)
RBC: 2.86 MIL/uL — ABNORMAL LOW (ref 4.22–5.81)
RDW: 21.2 % — ABNORMAL HIGH (ref 11.5–15.5)
WBC: 11.9 10*3/uL — ABNORMAL HIGH (ref 4.0–10.5)
nRBC: 0 % (ref 0.0–0.2)

## 2020-11-18 LAB — GLUCOSE, CAPILLARY
Glucose-Capillary: 164 mg/dL — ABNORMAL HIGH (ref 70–99)
Glucose-Capillary: 129 mg/dL — ABNORMAL HIGH (ref 70–99)
Glucose-Capillary: 185 mg/dL — ABNORMAL HIGH (ref 70–99)
Glucose-Capillary: 164 mg/dL — ABNORMAL HIGH (ref 70–99)
Glucose-Capillary: 171 mg/dL — ABNORMAL HIGH (ref 70–99)

## 2020-11-18 LAB — HEPATIC FUNCTION PANEL
ALT: 79 U/L — ABNORMAL HIGH (ref 0–44)
AST: 175 U/L — ABNORMAL HIGH (ref 15–41)
Albumin: 1.9 g/dL — ABNORMAL LOW (ref 3.5–5.0)
Alkaline Phosphatase: 191 U/L — ABNORMAL HIGH (ref 38–126)
Bilirubin, Direct: 0.5 mg/dL — ABNORMAL HIGH (ref 0.0–0.2)
Indirect Bilirubin: 0.9 mg/dL (ref 0.3–0.9)
Total Bilirubin: 1.4 mg/dL — ABNORMAL HIGH (ref 0.3–1.2)
Total Protein: 5.7 g/dL — ABNORMAL LOW (ref 6.5–8.1)

## 2020-11-18 MED ORDER — CALCIUM CARBONATE ANTACID 500 MG PO CHEW
1.0000 | CHEWABLE_TABLET | Freq: Three times a day (TID) | ORAL | Status: DC
  Administered 2020-11-18 – 2020-12-01 (×37): 200 mg via ORAL
  Filled 2020-11-18 (×38): qty 1

## 2020-11-18 MED ORDER — VITAMIN D 25 MCG (1000 UNIT) PO TABS
1000.0000 [IU] | ORAL_TABLET | Freq: Every day | ORAL | Status: DC
  Administered 2020-11-19 – 2020-12-01 (×12): 1000 [IU] via ORAL
  Filled 2020-11-18 (×13): qty 1

## 2020-11-18 MED ORDER — ENSURE ENLIVE PO LIQD
237.0000 mL | Freq: Three times a day (TID) | ORAL | Status: DC
  Administered 2020-11-18: 237 mL via ORAL
  Filled 2020-11-18: qty 237

## 2020-11-18 MED ORDER — ROSUVASTATIN CALCIUM 5 MG PO TABS
10.0000 mg | ORAL_TABLET | Freq: Every day | ORAL | Status: DC
  Administered 2020-11-19 – 2020-12-01 (×12): 10 mg via ORAL
  Filled 2020-11-18 (×13): qty 2

## 2020-11-18 MED ORDER — AMIODARONE HCL 200 MG PO TABS
200.0000 mg | ORAL_TABLET | Freq: Every day | ORAL | Status: DC
  Administered 2020-11-19 – 2020-12-01 (×10): 200 mg via ORAL
  Filled 2020-11-18 (×13): qty 1

## 2020-11-18 MED ORDER — DOCUSATE SODIUM 50 MG/5ML PO LIQD: 100.0000 mg | Freq: Two times a day (BID) | ORAL | Status: DC | PRN

## 2020-11-18 MED ORDER — POLYSACCHARIDE IRON COMPLEX 150 MG PO CAPS
150.0000 mg | ORAL_CAPSULE | Freq: Every day | ORAL | Status: DC
  Administered 2020-11-18 – 2020-12-01 (×13): 150 mg via ORAL
  Filled 2020-11-18 (×14): qty 1

## 2020-11-18 MED ORDER — ADULT MULTIVITAMIN W/MINERALS CH
1.0000 | ORAL_TABLET | Freq: Two times a day (BID) | ORAL | Status: DC
  Administered 2020-11-18 – 2020-12-01 (×26): 1 via ORAL
  Filled 2020-11-18 (×27): qty 1

## 2020-11-18 MED ORDER — APIXABAN 5 MG PO TABS
5.0000 mg | ORAL_TABLET | Freq: Two times a day (BID) | ORAL | Status: DC
  Administered 2020-11-18 – 2020-12-01 (×26): 5 mg via ORAL
  Filled 2020-11-18 (×27): qty 1

## 2020-11-18 MED ORDER — ZINC SULFATE 220 (50 ZN) MG PO CAPS
220.0000 mg | ORAL_CAPSULE | Freq: Every day | ORAL | Status: AC
  Administered 2020-11-19 – 2020-11-28 (×9): 220 mg via ORAL
  Filled 2020-11-18 (×9): qty 1

## 2020-11-18 NOTE — Progress Notes (Addendum)
Inpatient Rehab Admissions Coordinator Note:   Per therapy recommendations, pt was screened for CIR candidacy by Clemens Catholic, [place] CCC-SLP. At this time, Pt. Is tolerating only bed level activities with therapies with max-max+2 A. He does not appear able to tolerate intensity of CIR at this time. In addition, CIR is not in network with Pt.s [facility] Ad[facility] plan.    Please contact me with questions.   Clemens Catholic, [place], [place] Admissions Coordinator  [phone] ([place]) [phone] (office)

## 2020-11-18 NOTE — Progress Notes (Addendum)
Daily Rounding Note  11/18/2020, 11:31 AM  LOS: 10 days   SUBJECTIVE:   Chief complaint: Rising transaminases and alkaline phosphatase. In pt at [facility] for 11 days after transfer from [facility].  See original GI consult note of 5/19.  Patient was transferred up from [facility].   Hospital with acute cholecystitis.  Elevated LFTs. Vague reports of CBD stone were not confirmed by subsequent imaging.   5/19 ultrasound abdomen with gallstones, mild pericholecystic fluid and mild gallbladder wall thickening.  CBD 4 mm.  Normal-appearing liver parenchyma. 11/09/2020 CTAP w/o contrast: Acute calculus cholecystitis, no perforation.  No biliary ductal dilatation.  Atelectasis versus pneumonia in the lungs.  Changes of gastric bypass with prominent loops of contrast in the small bowel of the left upper quadrant, possibly representing ileus.  Aortic atherosclerosis.  Perc chole tube drainage variable, between 200 to 350 mL daily over last 7 days Requiring HD for oliguric acute renal failure.  .   Labs over course of admission:  t bili 3.5 >> 3.8 >> 1.5.   Alk phos 121 >> 177 >> 191 AST/ALT: 45/89 >> 31/70 >> 157/58 >> 175/79 Ammonia  27 yesterday.   WBCs 22.5 >> 11.9.  Hgb 11 >> 7.4.  Consistently low MCV in low to upper 70s.   Platelets 71 >> 154.    He is not eating well at all after pulling out his core track feeding tube, he denies nausea.  Denies abdominal pain.  Passing loose stools and flatus.  His 1 complaint is knee pain from arthritis.    OBJECTIVE:         Vital signs in last 24 hours:    Temp:  [97.7 F (36.5 C)-99.9 F (37.7 C)] 97.7 F (36.5 C) (05/29 0442) Pulse Rate:  [85-102] 98 (05/29 0442) Resp:  [17-30] 18 (05/29 0442) BP: (99-138)/(53-66) 113/62 (05/29 0442) SpO2:  [93 %-97 %] 96 % (05/29 0442) Weight:  [118.5 kg-120.5 kg] 120.2 kg (05/29 0557) Last BM Date: 11/18/20 Filed Weights   11/17/20 1355  11/17/20 1730 11/18/20 0557  Weight: 120.5 kg 118.5 kg 120.2 kg   General: Looks unwell but not toxic.  Obese. Heart: Irregularly irregular, not tacky or bradycardic. Chest: No labored breathing or cough. Abdomen: Soft.  Not tender, even over the site of the percutaneous drain in the lateral right upper abdomen.  Deep clear brown bile visible in PERC drain collection bag.  Bowel sounds high-pitched. Extremities: No CCE. Neuro/Psych: Appropriate.  Follows commands.  Able to verbally express himself appropriately.  Intake/Output from previous day: 05/28 0701 - 05/29 0700 In: 1263 [P.O.:150; NG/GT:1008; IV Piggyback:100] Out: 2350 [Drains:350]  Intake/Output this shift: No intake/output data recorded.  Lab Results: Recent Labs    11/16/20 0500 11/17/20 0310 11/18/20 0302  WBC 13.0* 11.3* 11.9*  HGB 8.4* 7.7* 7.4*  HCT 23.4* 21.5* 22.1*  PLT 107* 128* 154   BMET Recent Labs    11/16/20 0500 11/17/20 0310 11/18/20 0302  NA 135 133* 133*  K 2.9* 3.1* 3.4*  CL 103 102 98  CO2 24 22 25   GLUCOSE 164* 197* 165*  BUN 50* 66* 47*  CREATININE 4.93* 5.90* 4.59*  CALCIUM 7.9* 7.8* 7.8*   LFT Recent Labs    11/16/20 0500 11/17/20 0310 11/18/20 0302  PROT  --  5.3* 5.7*  ALBUMIN 1.9* 1.7*  1.7* 1.9*  1.9*  AST  --  157* 175*  ALT  --  58*  79*  ALKPHOS  --  190* 191*  BILITOT  --  1.5* 1.4*  BILIDIR  --  0.4* 0.5*  IBILI  --  1.1* 0.9   PT/INR No results for input(s): LABPROT, INR in the last 72 hours. Hepatitis Panel No results for input(s): HEPBSAG, HCVAB, HEPAIGM, HEPBIGM in the last 72 hours.  Studies/Results: No results found.   Scheduled Meds: . amiodarone  200 mg Per Tube Daily  . apixaban  5 mg Per Tube BID  . calcium carbonate  1 tablet Per Tube TID  . Chlorhexidine Gluconate Cloth  6 each Topical Q0600  . cholecalciferol  1,000 Units Per Tube Daily  . darbepoetin (ARANESP) injection - NON-DIALYSIS  40 mcg Subcutaneous Q Sat-1800  . feeding  supplement  237 mL Oral TID BM  . insulin aspart  0-15 Units Subcutaneous Q4H  . lidocaine  1 patch Transdermal Q24H  . multivitamin with minerals  1 tablet Per Tube BID  . rosuvastatin  10 mg Per Tube Daily  . sodium chloride flush  10-40 mL Intracatheter Q12H  . sodium chloride flush  5 mL Intracatheter Q8H  . zinc sulfate  220 mg Per Tube Daily   Continuous Infusions: . sodium chloride    . meropenem (MERREM) IV 500 mg (11/18/20 0717)   PRN Meds:.docusate, heparin, ondansetron (ZOFRAN) IV, polyethylene glycol, sodium chloride flush   ASSESMENT:   *    Acute calculus cholecystitis.  Percutaneous cholecystostomy tube placed 5/20. Drainage variable, between 200 to 350 mL daily over last 7 days.   Alk phos and transaminases rising.  However clinically he is pain-free, nausea free. Received IV and now on p.o. amiodarone for A. fib/RVR.  Wonder if the amiodarone is causing elevated LFTs?   *   Septic shock.  Required ICU admission, pressors.  Resolved. ESBL E. coli bacteremia.  Meropenem in place.    *    atrial fibrillation.  RVR.  Has been on Eliquis for stroke prevention. On amiodarone, Eliquis.    *   AKI.  Receiving HD as of 5/23.    *    Severe malnutrition.  Previous gastric bypass.  Core track tube feedings 5/25 through 5/28, stopped when pt pulled coretrak out.  Poor intake on regular diet.  Calorie count in place.  Pt and wife prefer FT not be replace.  TID Ensure in place.    *   Hyponatremia.  *   Hypokalemia.  *   Status post bariatric gastrojejunostomy, Roux-en-Y bypass.  *    Microcytic anemia. Low iron, low iron sats, low TIBC.  Ferritin 181.  B12 Weekly Aranesp initiated.    *   Acute encephalopathy.   improved .     PLAN   *  Will be seen by Dr Loletha Carrow.    *   Per surgery's last (sign off) note on 5/27 the PERC drain needs to stay in place for 6 to 8 weeks.  Surgery plan to see him in the office in about 6 weeks from discharge and recommended IR see him  in 4 to 5 weeks for drain assessment.  *   Check INR and LFTs in AM.      Azucena Freed  11/18/2020, 11:31 AM Phone [phone]  I have discussed the case with the APP, and that is the plan I formulated. I personally interviewed and examined the patient. His wife is also at the bedside for my entire visit  He is making slow clinical  improvement on multiple fronts.  We were asked to reevaluate his elevated LFTs, and we last saw him several days ago. Modest elevation in transaminases and alkaline phosphatase from what it was several days ago. I still think it is not likely obstructive, and is related to cholecystitis, acute illness and perhaps effect of medicine such as amiodarone.  I feel we should continue to monitor it for the time being.  Check daily.  If LFTs continue to rise, we may need to talk with his primary medical team about discontinuing the amiodarone.  (I do not see a cardiology consult this admission)    Nelida Meuse III Office: [phone]

## 2020-11-18 NOTE — Evaluation (Signed)
Physical Therapy Evaluation Patient Details Name: Tom Scott MRN: [id number] DOB: [birth year] Today's Date: 11/18/2020   History of Present Illness  Tom Scott is a 65 y.o. male initially presented to [facility] in the hospital secondary to chest pain and epigastric pain with concern for choledocholithiasis.  He was transferred to [facility] for ERCP and due to septic shock requiring vasopressor support.  He was admitted to the ICU.  General surgery and gastroenterology were consulted for management of his acute calculus cholecystitis.  IR consulted for percutaneous cholecystostomy and patient managed with empiric antibiotics.  While admitted, patient developed ATN requiring initiation of hemodialysis.  Also while admitted, patient was found to have evidence of ESBL E. coli bacteremia   PMH significant for history of atrial fibrillation, hypertension, diabetes mellitus, hyperlipidemia.  Clinical Impression  Patient presents with significant dependencies in gait and mobility due to prolonged bedrest, generalized weakness, LE edema, decreased balance.  Patient motivated to work hard and improve mobility and has supportive wife. Feel patient would do well in CIR setting with intensive therapies.   Will benefit from continued PT in hospital to progress mobility and increase independence.     Follow Up Recommendations CIR    Equipment Recommendations  Rolling walker with 5" wheels;3in1 (PT)    Recommendations for Other Services Rehab consult     Precautions / Restrictions Precautions Precautions: Fall;Other (comment) Precaution Comments: non-tunneled HD catheter left neck      Mobility  Bed Mobility Overal bed mobility: Needs Assistance Bed Mobility: Rolling;Supine to Sit;Sit to Supine Rolling: Mod assist (mostly for LE support due to pain)   Supine to sit: Max assist Sit to supine: Max assist;+2 for physical assistance   General bed mobility comments: limited by pain in  LE's; once sitting, able to maintain upright independently    Transfers                    Ambulation/Gait                Stairs            Wheelchair Mobility    Modified Rankin (Stroke Patients Only)       Balance Overall balance assessment: Needs assistance Sitting-balance support: No upper extremity supported;Feet supported Sitting balance-Leahy Scale: Good                                       Pertinent Vitals/Pain Pain Assessment: 0-10 Pain Score: 6  Pain Location: right knee Pain Descriptors / Indicators: Discomfort;Sharp Pain Intervention(s): Limited activity within patient's tolerance;Monitored during session;Repositioned    Home Living Family/patient expects to be discharged to:: Private residence Living Arrangements: Spouse/significant other Available Help at Discharge: Family;Available 24 hours/day Type of Home: House Home Access: Stairs to enter Entrance Stairs-Rails: Right Entrance Stairs-Number of Steps: 4 Home Layout: One level Home Equipment: None      Prior Function Level of Independence: Independent               Hand Dominance        Extremity/Trunk Assessment   Upper Extremity Assessment Upper Extremity Assessment: Defer to OT evaluation    Lower Extremity Assessment Lower Extremity Assessment: RLE deficits/detail;LLE deficits/detail RLE Deficits / Details: limited by arthritic pain and LE edema.  active ankle pump, but limited ROM; unable to extend knee against gravity LLE Deficits / Details: less limited that RLE, but  still limited by arthritic pain and LE edema; active ankle pump but limited ROM, able to initiate knee extension against gravity       Communication   Communication: No difficulties  Cognition Arousal/Alertness: Awake/alert Behavior During Therapy: WFL for tasks assessed/performed Overall Cognitive Status: Within Functional Limits for tasks assessed                                         General Comments      Exercises     Assessment/Plan    PT Assessment Patient needs continued PT services  PT Problem List Decreased strength;Decreased range of motion;Decreased activity tolerance;Decreased balance;Decreased mobility;Decreased knowledge of use of DME;Pain;Obesity       PT Treatment Interventions DME instruction;Gait training;Functional mobility training;Therapeutic activities;Therapeutic exercise;Balance training;Patient/family education    PT Goals (Current goals can be found in the Care Plan section)  Acute Rehab PT Goals Patient Stated Goal: get back home PT Goal Formulation: With patient/family Time For Goal Achievement: 12/02/20 Potential to Achieve Goals: Good    Frequency Min 3X/week   Barriers to discharge        Co-evaluation               AM-PAC PT "6 Clicks" Mobility  Outcome Measure Help needed turning from your back to your side while in a flat bed without using bedrails?: A Little Help needed moving from lying on your back to sitting on the side of a flat bed without using bedrails?: A Lot Help needed moving to and from a bed to a chair (including a wheelchair)?: Total Help needed standing up from a chair using your arms (e.g., wheelchair or bedside chair)?: Total Help needed to walk in hospital room?: Total Help needed climbing 3-5 steps with a railing? : Total 6 Click Score: 9    End of Session   Activity Tolerance: Patient tolerated treatment well;Patient limited by pain Patient left: in bed;with call bell/phone within reach;with family/visitor present Nurse Communication: Mobility status;Need for lift equipment PT Visit Diagnosis: Other abnormalities of gait and mobility (R26.89);Muscle weakness (generalized) (M62.81);Pain Pain - Right/Left: Right Pain - part of body: Knee    Time: 1100-1140 PT Time Calculation (min) (ACUTE ONLY): 40 min   Charges:   PT Evaluation $PT Eval Moderate  Complexity: 1 Mod PT Treatments $Therapeutic Activity: 23-37 mins        11/18/2020 Margie, PT Acute Rehabilitation Services Pager:  [phone] Office:  [phone]    Shanna Cisco 11/18/2020, 11:47 AM

## 2020-11-18 NOTE — Progress Notes (Signed)
[facility] Progress Note  Name: Tom Scott MRN: [id number] DOB: May 05, 1956  Chief Complaint:  abd pain and chest pain  Subjective:  Last HD on 5/28 with 2 kg UF.  PVR negligible on 5/28 per nursing report.  His wife is at bedside.  They are ok with [place] consulting for a tunneled HD catheter.   Review of systems:  Denies shortness of breath Denies chest pain  Denies n/v  ----------- Background on consult:  Tom Scott is a 65 y.o. male, diabetes atrial fibrillation who presented to epigastric and chest pain.  Several days of abd pain which worsened in the 3 days before presentation.  He was transferred to [facility] for septic shock and concern for choledocolithiasis.  He was found to have septic shock due to calculus cholecystitis and gram negative bacteremia.  He is s/p percutaneous cholecystostomy on 5/20.  His course has been complicated by AKI.  Nephrology consulted for assistance with management of the same.  He had 150 mL uop over 5/22 and he had lasix 60 mg IV once on 5/22.  He has been on bicarb gtt.  He was on LR at 100 ml/hr 5/19-5/22.  He was on levo 5/19-5/20 and phenylephrine 5/19 - 5/22.  He had a CT abd/pelvis without contrast on 5/20 with no hydro.  Critical care has placed a nontunneled catheter for HD access.  His wife and daughter provided some history as he was not able to answer questions.  He said hi to his daughter but did not answer her questions, as well.    Intake/Output Summary (Last 24 hours) at 11/18/2020 1153 Last data filed at 11/18/2020 0500 Gross per 24 hour  Intake 1108 ml  Output 2350 ml  Net -1242 ml    Vitals:  Vitals:   11/17/20 2013 11/18/20 0427 11/18/20 0442 11/18/20 0557  BP: (!) 101/53 (!) 99/56 113/62   Pulse: (!) 102 88 98   Resp: '18 17 18   '$ Temp: 99.9 F (37.7 C) 99.4 F (37.4 C) 97.7 F (36.5 C)   TempSrc: Oral Oral    SpO2: 96% 93% 96%   Weight:    120.2 kg  Height:         Physical Exam:   General: elderly male  in bed  HEENT: NCAT Eyes: EOMI sclera anicteric Neck: supple trachea midline Heart: S1S2 ; no rub  Lungs: clear and unlabored at rest Abdomen: soft/nondistended; RUQ abd drain Extremities: 2+ pitting edema; no cyanosis or clubbing; he needs assistance with moving legs - heavy with fluid Neuro: awake on arrival and oriented x 3 conversant psych - calm; no agitation  GU no foley Access: left IJ nontunneled HD catheter   Medications reviewed   Labs:  BMP Latest Ref Rng & Units 11/18/2020 11/17/2020 11/16/2020  Glucose 70 - 99 mg/dL 165(H) 197(H) 164(H)  BUN 8 - 23 mg/dL 47(H) 66(H) 50(H)  Creatinine 0.61 - 1.24 mg/dL 4.59(H) 5.90(H) 4.93(H)  Sodium 135 - 145 mmol/L 133(L) 133(L) 135  Potassium 3.5 - 5.1 mmol/L 3.4(L) 3.1(L) 2.9(L)  Chloride 98 - 111 mmol/L 98 102 103  CO2 22 - 32 mmol/L '25 22 24  '$ Calcium 8.9 - 10.3 mg/dL 7.8(L) 7.8(L) 7.9(L)     Assessment/Plan:   # AKI  - Secondary to ATN from septic shock; pt also with afib with RVR.  On lasix and irbesartan at home.  No hydro on CT a/p.  Started HD on 5/23 pm after nontunneled catheter placed by critical care.   -  HD per a TTS schedule for now - When able get UA and up/cr ratio - reordered   - strict ins/outs - NPO after midnight for tunneled catheter with IR - little improvement in Cr with HD so I question adequacy of the catheter. Consult IR for tunneled HD catheter for 5/30.  I paused eliquis and contacted his primary team re: same.  - ordered heel protectors - lower extremity edema limits mobility   # septic shock  - secondary to calculus cholecystitis  - abx per primary team - on meropenem  # Gram negative Bacteremia - abx per primary team - on meropenem - 5/20 blood cx here negative  # Metabolic acidosis  - setting of shock, AKI  - continue HD as per above  # Calculus cholecystitis  - s/p perc cholecystostomy 5/20   # Afib with RVR - on PO amio now   # Encephalopathy acute - improved - multifactorial  -metabolic derangements. Some improvement with HD noted then improved over the last week with collective measures.  Ammonia normal - HD as above  - work -up per primary team   # Transaminitis  - may be secondary to shock however fluctuating at a relatively low level of impairment - per primary team  # Microcytic anemia  - Start PO iron; defer IV with sepsis - Aranesp 40 mcg started on 5/28 and every Sat.   Claudia Desanctis, MD 11/18/2020  12:15 PM

## 2020-11-18 NOTE — Progress Notes (Signed)
Calorie Count Note  RD paged per MD. Patient pulled Cortrak out last night (when patient asked about this, he was unsure what happened). Wife at bedside states patient consumed cereal with milk and juice this am. Is currently working on an Ensure.   Wife and patient does not wish to have tube replaced at this time. Made wife aware that we will start a calorie count to see if patient is meeting needs and if not the tube may need to be replaced as he is severely malnourished. Wife to encourage patient.   Increase Ensure Enlive to TID. RN to hang calorie count envelope on the patient's door. RN to document percent consumed for each item on the patient's meal tray ticket, any supplement, or snack pt consumes and keep documentation in envelope for RD to review.   Mariana Single RD, LDN Clinical Nutrition Pager listed in [place]

## 2020-11-18 NOTE — Progress Notes (Signed)
PROGRESS NOTE    Tom Scott  [id number] DOB: [birth year] DOA: 11/08/2020 PCP: Patient, No Pcp Per (Inactive)   Brief Narrative: Tom Scott is a 65 y.o. male with a history of atrial fibrillation, hypertension, diabetes mellitus, hyperlipidemia.  Patient initially presented to [facility] in the hospital secondary to chest pain and epigastric pain with concern for choledocholithiasis.  He was transferred to [facility] for ERCP and due to septic shock requiring vasopressor support.  He was admitted to the ICU.  General surgery and gastroenterology were consulted for management of his acute calculus cholecystitis.  IR consulted for percutaneous cholecystostomy and patient managed with empiric antibiotics.  While admitted, patient developed ATN requiring initiation of hemodialysis.  Also while admitted, patient was found to have evidence of ESBL E. coli bacteremia from outside hospital report.  Patient's antibiotics have been transitioned to meropenem to treat his bacteremia and cholecystitis.   Assessment & Plan:   Principal Problem:   Septic shock ([place]) Active Problems:   Pressure injury of skin   Protein-calorie malnutrition, severe   Encounter for central line placement   AKI (acute kidney injury) ([place])   Septic shock Present on admission.  Secondary to cholecystitis and bacteremia. Patient required ICU admission with vasopressor support. Weaned off vasopressors and currently stable.  Acute calculus cholecystitis General surgery and IR consulted. IR placed percutaneous cholecystostomy on 5/20. LFTs with initial downtrend. AST/ALT with continued elevation. Patient with tenderness on exam, but diffuse.  -Re-consult GI  ESBL E. Coli bacteremia Report received from OSH per PCCM that reports ESBL bacteremia and patient started on meropenem IV. -Continue Meropenem -ID consult on Monday for antibiotic duration recommendations  Acute metabolic encephalopathy Secondary to  uremia. Resolved.  AKI Secondary to ATN secondary to septic shock. Patient started on hemodialysis. Poor urine output. -Nephrology recommendations: TTS schedule for HD  Atrial fibrillation with RVR Patient started on amiodarone for control. Currently rate controlled. On Eliquis for stroke prevention -Continue amiodarone and Eliquis  Asymptomatic anemia Patient with unknown baseline. Hemoglobin of 11 on admission with slow downtrend. No obvious source of bleeding. -CBC in AM  Anasarca Secondary to fluid resuscitation with IV fluids in setting of kidney failure in addition to albuminemia. Current I/O of +17.4L  Primary hypertension Patient is on amlodipine, losartan and metoprolol as an outpatient. Antihypertensives held secondary to hypotension and septic shock. Blood pressure now better controlled and stable. -If worsening control, can restart regimen  Diabetes mellitus, type 2 Patient is on metformin as an outpatient. -SSI  Hyperlipidemia -Continue Crestor  Severe malnutrition History of gastric bypass. Worse secondary to critical illness. NG tube inserted on 5/25 and patient started on tube feeds on 5/25. NG tube removed by patient on 5/28. -Oral intake -Dietitian recommendations: calorie count  Pressure injury Medial coccyx, not POA   DVT prophylaxis: Eliquis Code Status:   Code Status: Full Code Family Communication: Wife at bedside Disposition Plan: Discharge likely in several days pending management of AKI, transition off tube feeds, PT/OT   Consultants:   PCCM  Gastroenterology  General surgery  Interventional radiology  Nephrology  Procedures:     Antimicrobials:  Flagyl  Cefepime  Meropenem    Subjective: Pulled his feeding tube last night. No issues this morning.  Objective: Vitals:   11/17/20 2013 11/18/20 0427 11/18/20 0442 11/18/20 0557  BP: (!) 101/53 (!) 99/56 113/62   Pulse: (!) 102 88 98   Resp: '18 17 18   '$ Temp: 99.9 F  (37.7 C) 99.4  F (37.4 C) 97.7 F (36.5 C)   TempSrc: Oral Oral    SpO2: 96% 93% 96%   Weight:    120.2 kg  Height:        Intake/Output Summary (Last 24 hours) at 11/18/2020 1112 Last data filed at 11/18/2020 0500 Gross per 24 hour  Intake 1108 ml  Output 2350 ml  Net -1242 ml   Filed Weights   11/17/20 1355 11/17/20 1730 11/18/20 0557  Weight: 120.5 kg 118.5 kg 120.2 kg    Examination:  General exam: Appears calm and comfortable  Respiratory system: Clear to auscultation. Respiratory effort normal. Cardiovascular system: S1 & S2 heard, RRR. No murmurs, rubs, gallops or clicks. Gastrointestinal system: Abdomen is nondistended, soft and diffusely tender with patient seemingly downplaying tenderness. No organomegaly or masses felt. Normal bowel sounds heard. Central nervous system: Alert and oriented. No focal neurological deficits. Musculoskeletal: 2+ BLE edema. No calf tenderness Skin: No cyanosis. No rashes Psychiatry: Judgement and insight appear normal. Mood & affect appropriate.     Data Reviewed: I have personally reviewed following labs and imaging studies  CBC Lab Results  Component Value Date   WBC 11.9 (H) 11/18/2020   RBC 2.86 (L) 11/18/2020   HGB 7.4 (L) 11/18/2020   HCT 22.1 (L) 11/18/2020   MCV 77.3 (L) 11/18/2020   MCH 25.9 (L) 11/18/2020   PLT 154 11/18/2020   MCHC 33.5 11/18/2020   RDW 21.2 (H) [id number]     Last metabolic panel Lab Results  Component Value Date   NA 133 (L) 11/18/2020   K 3.4 (L) 11/18/2020   CL 98 11/18/2020   CO2 25 11/18/2020   BUN 47 (H) 11/18/2020   CREATININE 4.59 (H) 11/18/2020   GLUCOSE 165 (H) 11/18/2020   GFRNONAA 13 (L) 11/18/2020   CALCIUM 7.8 (L) 11/18/2020   PHOS 3.8 11/18/2020   PROT 5.7 (L) 11/18/2020   ALBUMIN 1.9 (L) 11/18/2020   ALBUMIN 1.9 (L) 11/18/2020   BILITOT 1.4 (H) 11/18/2020   ALKPHOS 191 (H) 11/18/2020   AST 175 (H) 11/18/2020   ALT 79 (H) 11/18/2020   ANIONGAP 10 11/18/2020     CBG (last 3)  Recent Labs    11/17/20 2352 11/18/20 0422 11/18/20 0722  GLUCAP 158* 164* 129*     GFR: Estimated Creatinine Clearance: 20.3 mL/min (A) (by C-G formula based on SCr of 4.59 mg/dL (H)).  Coagulation Profile: No results for input(s): INR, PROTIME in the last 168 hours.  Recent Results (from the past 240 hour(s))  MRSA PCR Screening     Status: None   Collection Time: 11/08/20  5:28 PM   Specimen: Nasal Mucosa; Nasopharyngeal  Result Value Ref Range Status   MRSA by PCR NEGATIVE NEGATIVE Final    Comment:        The GeneXpert MRSA Assay (FDA approved for NASAL specimens only), is one component of a comprehensive MRSA colonization surveillance program. It is not intended to diagnose MRSA infection nor to guide or monitor treatment for MRSA infections. Performed at [facility], [place] [address], [place], [place] [address]   Culture, blood (routine x 2)     Status: None   Collection Time: 11/09/20  2:30 AM   Specimen: BLOOD LEFT HAND  Result Value Ref Range Status   Specimen Description BLOOD LEFT HAND  Final   Special Requests AEROBIC BOTTLE ONLY Blood Culture adequate volume  Final   Culture   Final    NO GROWTH 5 DAYS  Performed at [facility], [place] [address], [place], [place] [address]    Report Status 11/14/2020 FINAL  Final  Culture, blood (routine x 2)     Status: None   Collection Time: 11/09/20  2:35 AM   Specimen: BLOOD RIGHT HAND  Result Value Ref Range Status   Specimen Description BLOOD RIGHT HAND  Final   Special Requests AEROBIC BOTTLE ONLY Blood Culture adequate volume  Final   Culture   Final    NO GROWTH 5 DAYS Performed at [facility], [place] [address], [place], [place] [address]    Report Status 11/14/2020 FINAL  Final  Aerobic/Anaerobic Culture (surgical/deep wound)     Status: None   Collection Time: 11/09/20 12:28 PM   Specimen: Abscess  Result Value Ref Range Status   Specimen Description ABSCESS GALL  BLADDER  Final   Special Requests NONE  Final   Gram Stain   Final    FEW WBC PRESENT, PREDOMINANTLY MONONUCLEAR FEW GRAM VARIABLE ROD FEW GRAM POSITIVE COCCI    Culture   Final    ABUNDANT ESCHERICHIA COLI Confirmed Extended Spectrum Beta-Lactamase Producer (ESBL).  In bloodstream infections from ESBL organisms, carbapenems are preferred over piperacillin/tazobactam. They are shown to have a lower risk of mortality. RARE BACTEROIDES FRAGILIS BETA LACTAMASE POSITIVE Performed at [facility], [place] [address] [place],  [address]    Report Status 11/12/2020 FINAL  Final   Organism ID, Bacteria ESCHERICHIA COLI  Final      Susceptibility   Escherichia coli - MIC*    AMPICILLIN >=32 RESISTANT Resistant     CEFAZOLIN >=64 RESISTANT Resistant     CEFEPIME 2 SENSITIVE Sensitive     CEFTAZIDIME RESISTANT Resistant     CEFTRIAXONE >=64 RESISTANT Resistant     CIPROFLOXACIN >=4 RESISTANT Resistant     GENTAMICIN <=1 SENSITIVE Sensitive     IMIPENEM <=0.25 SENSITIVE Sensitive     TRIMETH/SULFA >=320 RESISTANT Resistant     AMPICILLIN/SULBACTAM 4 SENSITIVE Sensitive     PIP/TAZO <=4 SENSITIVE Sensitive     * ABUNDANT ESCHERICHIA COLI        Radiology Studies: No results found.      Scheduled Meds: . amiodarone  200 mg Per Tube Daily  . apixaban  5 mg Per Tube BID  . calcium carbonate  1 tablet Per Tube TID  . Chlorhexidine Gluconate Cloth  6 each Topical Q0600  . cholecalciferol  1,000 Units Per Tube Daily  . darbepoetin (ARANESP) injection - NON-DIALYSIS  40 mcg Subcutaneous Q Sat-1800  . feeding supplement  237 mL Oral BID BM  . feeding supplement (PROSource TF)  45 mL Per Tube TID  . insulin aspart  0-15 Units Subcutaneous Q4H  . lidocaine  1 patch Transdermal Q24H  . multivitamin with minerals  1 tablet Per Tube BID  . rosuvastatin  10 mg Per Tube Daily  . sodium chloride flush  10-40 mL Intracatheter Q12H  . sodium chloride flush  5 mL Intracatheter Q8H   . zinc sulfate  220 mg Per Tube Daily   Continuous Infusions: . sodium chloride    . feeding supplement (VITAL 1.5 CAL) Stopped (11/17/20 2225)  . meropenem (MERREM) IV 500 mg (11/18/20 0717)     LOS: 10 days    Cordelia Poche, MD [facility] 11/18/2020, 11:12 AM  If 7PM-7AM, please contact night-coverage [email or web address]

## 2020-11-18 NOTE — Consult Note (Addendum)
Chief Complaint: AKI. Request if for tunneled HD catheter  Referring Physician(s): Dr. Katheren Puller  Supervising Physician: Ruthann Cancer  Patient Status: [facility] - In-pt  History of Present Illness: Tom Scott is a 65 y.o. male History of a fib (on Xarelto), HTN, DM. Presented to OSH with  persistent and worsening chest and abdominal pain X 2 week.  Found to be in septic shock with acute calculus cholecystitis with choledocholithiasis. Patient was transferred to [facility] where he was found to be in a fib with RVR.  IR placed cholecystomy tube on 5.20.22 by Dr. Annamaria Boots. Cultures from gallbladder shows abundant e coli predominately monocular. Hospital stay complicated by AKI. Patient has a left sided non tunneled HD catheter placed by critical care. Due to patient's Cr not improving Team is requesting a tunneled HD catheter for ongoing dialysis access.   Currently without any significant complaints. Patient alert and laying in bed, calm and comfortable. Slightly confused.Wife at bedside. Return precautions and treatment recommendations and follow-up discussed with the patient and wife who is agreeable with the plan.   Past Medical History:  Diagnosis Date  . Atrial fibrillation ([place])   . Diabetes mellitus ([place])   . Hypertension     Past Surgical History:  Procedure Laterality Date  . GASTRIC BYPASS    . IR PERC CHOLECYSTOSTOMY  11/09/2020    Allergies: Penicillamine and Penicillins  Medications: Prior to Admission medications   Medication Sig Start Date End Date Taking? Authorizing Provider  amLODipine (NORVASC) 10 MG tablet Take 10 mg by mouth every morning. 10/18/20  Yes [provider]  aspirin EC 81 MG tablet Take 162 mg by mouth daily. Swallow whole.   Yes [provider]  furosemide (LASIX) 20 MG tablet Take 40 mg by mouth daily. 09/18/20  Yes [provider]  hydrOXYzine (ATARAX/VISTARIL) 10 MG tablet Take 10-20 mg by mouth at bedtime. 10/29/20  Yes  [provider]  irbesartan (AVAPRO) 150 MG tablet Take 150 mg by mouth daily. 08/30/20  Yes [provider]  metFORMIN (GLUCOPHAGE) 500 MG tablet Take 1,000 mg by mouth 2 (two) times daily. 09/14/20  Yes [provider]  metoprolol succinate (TOPROL-XL) 50 MG 24 hr tablet Take 50 mg by mouth every morning. 08/11/20  Yes [provider]  NONFORMULARY OR COMPOUNDED ITEM Apply 1 application topically 2 (two) times daily. TAC 0.1% cream + Hydrophor c&m 0.5% No more than a week at a time 08/13/20  Yes [provider]  rosuvastatin (CRESTOR) 10 MG tablet Take 10 mg by mouth at bedtime. 09/06/20  Yes [provider]     History reviewed. No pertinent family history.  Social History   Socioeconomic History  . Marital status: Married    Spouse name: Not on file  . Number of children: Not on file  . Years of education: Not on file  . Highest education level: Not on file  Occupational History  . Not on file  Tobacco Use  . Smoking status: Not on file  . Smokeless tobacco: Not on file  Substance and Sexual Activity  . Alcohol use: Not on file  . Drug use: Not on file  . Sexual activity: Not on file  Other Topics Concern  . Not on file  Social History Narrative  . Not on file   Social Determinants of Health   Financial Resource Strain: Not on file  Food Insecurity: Not on file  Transportation Needs: Not on file  Physical Activity: Not on  file  Stress: Not on file  Social Connections: Not on file    Review of Systems: A 12 point ROS discussed and pertinent positives are indicated in the HPI above.  All other systems are negative.  Review of Systems  Constitutional: Negative for fever.  HENT: Negative for congestion.   Respiratory: Negative for cough and shortness of breath.   Cardiovascular: Negative for chest pain.  Gastrointestinal: Negative for abdominal pain.  Neurological: Negative for headaches.  Psychiatric/Behavioral:  Negative for behavioral problems and confusion.    Vital Signs: BP 101/65 (BP Location: Left Arm)   Pulse 94   Temp (!) 101.1 F (38.4 C)   Resp 18   Ht '5\' 10"'$  (1.778 m)   Wt 264 lb 15.9 oz (120.2 kg)   SpO2 92%   BMI 38.02 kg/m   Physical Exam Vitals and nursing note reviewed.  Constitutional:      Appearance: He is well-developed.  HENT:     Head: Normocephalic.  Cardiovascular:     Rate and Rhythm: Normal rate and regular rhythm.     Heart sounds: Normal heart sounds.  Pulmonary:     Effort: Pulmonary effort is normal.     Breath sounds: Normal breath sounds.  Abdominal:     Comments: Positive RUQ drain to gravity bag. 100 ml of  Dark  colored fluid noted in foley catheter.   Musculoskeletal:        General: Normal range of motion.     Cervical back: Normal range of motion.  Skin:    General: Skin is dry.  Neurological:     Mental Status: He is alert and oriented to person, place, and time.     Imaging: CT ABDOMEN PELVIS WO CONTRAST  Result Date: 11/09/2020 CLINICAL DATA:  Nausea and vomiting EXAM: CT ABDOMEN AND PELVIS WITHOUT CONTRAST TECHNIQUE: Multidetector CT imaging of the abdomen and pelvis was performed following the standard protocol without IV contrast. COMPARISON:  Abdominal ultrasound Nov 08, 2020. FINDINGS: Lower chest: Evaluation of the lung bases is limited by respiratory motion. Bibasilar airspace consolidations. Partially visualized central venous catheter with tip overlying the superior cavoatrial junction. Coronary artery calcifications. Hepatobiliary: Unremarkable noncontrast appearance of the hepatic parenchyma. No portal venous gas. Gallbladder is distended with layering cholelithiasis wall thickening and pericholecystic fluid consistent with acute calculus cholecystitis. No biliary ductal dilation. Pancreas: Fatty replacement of the pancreatic parenchyma. No pancreatic ductal dilation. Spleen: Within normal limits. Adrenals/Urinary Tract: Bilateral  adrenal glands are unremarkable. No hydronephrosis. No contour deforming renal masses. Urinary bladder is decompressed around a Foley catheter. Stomach/Bowel: Postsurgical changes of gastric bypass. Nasogastric tube with tip in the Roux limb. Prominent loops of contrast filled small bowel in the left upper quadrant without abrupt transition to decompressed bowel. Appendix not visualized. No colonic wall or adjacent inflammation. Vascular/Lymphatic: IVC filter. Aortic atherosclerosis without aneurysmal dilation. No pathologically enlarged abdominal or pelvic lymph nodes. Reproductive: Prostate is unremarkable. Other: Pericholecystic predominant right upper quadrant stranding with trace fluid in the right pericolic gutter without walled off collection. No pneumoperitoneum. Musculoskeletal: Multilevel degenerative changes spine with bony ankylosis of the thoracolumbar spine. No acute osseous abnormality. IMPRESSION: 1. Findings consistent with acute calculus cholecystitis. No evidence of perforation. No biliary ductal dilation. 2. Bibasilar airspace consolidations may represent atelectasis or pneumonia. 3. Postsurgical changes of gastric bypass. Prominent loops of contrast filled small bowel in the left upper quadrant without abrupt transition to decompressed bowel. Findings may represent ileus. 4. Aortic atherosclerosis. Aortic Atherosclerosis (ICD10-I70.0). Electronically  Signed   By: Dahlia Bailiff MD   On: 11/09/2020 00:36   DG Chest 1 View  Result Date: 11/12/2020 Mike Craze, DO     11/12/2020 12:16 PM Central Venous Catheter Insertion Procedure Note Hinson Vlasic [id number] 1956/06/09 Procedure: Insertion of Non-tunneled HD Catheter with US guidance Indications: HD Procedure Details Consent: Risks of procedure as well as the alternatives and risks of each were explained to the (patient/caregiver).  Consent for procedure obtained. Time Out: Verified patient identification, verified procedure, site/side was  marked, verified correct patient position, special equipment/implants available, medications/allergies/relevent history reviewed, required imaging and test results available.  Performed Maximum sterile technique was used including antiseptics, cap, gloves, gown, hand hygiene, mask and sheet. Skin prep: Chlorhexidine; local anesthetic administered A antimicrobial bonded/coated triple lumen catheter was placed in the left internal jugular vein using the Seldinger technique. Catheter placed to 15 cm. Blood aspirated via all 3 ports and then flushed x 3. Line sutured x 2 and dressing applied. Ultrasound guidance used.Yes.  Evaluation Blood flow good Complications: No apparent complications Patient did tolerate procedure well. Chest X-ray ordered to verify placement.  CXR: pending. Harlow Ohms, DO PGY-2 IMTS   IR Perc Cholecystostomy  Result Date: 11/09/2020 INDICATION: Acute calculus cholecystitis EXAM: ULTRASOUND FLUOROSCOPIC PERCUTANEOUS TRANSHEPATIC CHOLECYSTOSTOMY MEDICATIONS: Patient is already receiving IV antibiotics as an inpatient; The antibiotic was administered within an appropriate time frame prior to the initiation of the procedure. ANESTHESIA/SEDATION: Moderate (conscious) sedation was employed during this procedure. A total of Versed 1.0 mg and Fentanyl 50 mcg was administered intravenously. Moderate Sedation Time: 10 minutes. The patient's level of consciousness and vital signs were monitored continuously by radiology nursing throughout the procedure under my direct supervision. FLUOROSCOPY TIME:  Fluoroscopy Time: 0 minutes 30 seconds (6 mGy). COMPLICATIONS: None immediate. PROCEDURE: Informed written consent was obtained from the patient after a thorough discussion of the procedural risks, benefits and alternatives. All questions were addressed. Maximal Sterile Barrier Technique was utilized including caps, mask, sterile gowns, sterile gloves, sterile drape, hand hygiene and skin antiseptic. A  timeout was performed prior to the initiation of the procedure. Previous imaging reviewed. Preliminary ultrasound performed in the right upper quadrant. The thick-walled edematous gallbladder was localized and marked for a transhepatic approach below the right subcostal margin. Under sterile conditions and local anesthesia, a 21 gauge needle was advanced percutaneously via transhepatic approach into the gallbladder. Needle position confirmed with ultrasound. Images obtained for documentation. Bile sample obtained and sent for culture. Guidewire inserted followed by Accustick dilator set. Amplatz guidewire inserted followed by tract dilatation insert a 10 French drain. Drain catheter position confirmed with ultrasound and fluoroscopy. Images obtained for documentation. Catheter secured with a Prolene suture and connected to external gravity drainage bag. Sterile dressing applied. No immediate complication. Patient tolerated the procedure well. IMPRESSION: Successful ultrasound and fluoroscopic 10 French percutaneous transhepatic cholecystostomy. Electronically Signed   By: Jerilynn Mages.  Shick M.D.   On: 11/09/2020 13:17   DG CHEST PORT 1 VIEW  Result Date: 11/12/2020 CLINICAL DATA:  Central line placement EXAM: PORTABLE CHEST 1 VIEW COMPARISON:  Portable exam 1412 hours compared to 11/08/2020 FINDINGS: Indwelling RIGHT jugular central venous catheter with tip projecting over cavoatrial junction. New LEFT subclavian central venous catheter with tip projecting over LEFT brachiocephalic vein. Enlargement of cardiac silhouette with pulmonary vascular congestion. Atherosclerotic calcification aorta. Bibasilar atelectasis and question minimal perihilar edema. No definite pleural effusion or pneumothorax. IMPRESSION: Enlargement of cardiac silhouette with pulmonary vascular congestion and question minimal  perihilar edema. Bibasilar atelectasis. No pneumothorax following LEFT jugular line placement. Electronically Signed   By:  Lavonia Dana M.D.   On: 11/12/2020 14:42   DG CHEST PORT 1 VIEW  Result Date: 11/08/2020 CLINICAL DATA:  Central line placement EXAM: PORTABLE CHEST 1 VIEW COMPARISON:  None. FINDINGS: Right IJ approach central venous catheter tip terminates near the superior cavoatrial junction. Notable cardiomegaly though may be somewhat accentuated by portable technique and low volumes. Calcified aorta. Low lung volumes and atelectatic changes including more bandlike areas of subsegmental atelectasis towards the bases. Additional hazy and patchy opacities could reflect further atelectatic volume loss though early airspace disease is difficult to exclude. Suspect at least small bilateral effusions. No pneumothorax. No acute osseous or soft tissue abnormality. Telemetry leads overlie the chest. IMPRESSION: Right IJ approach central venous catheter tip terminates at the superior cavoatrial junction. Low volumes and atelectasis including more bandlike subsegmental atelectasis in the bases. Patchy opacities could reflect further volume loss or early airspace disease. Suspect small bilateral effusions. Enlarged cardiac silhouette though may be accentuated by portable technique. Electronically Signed   By: Lovena Le M.D.   On: 11/08/2020 16:36   DG Abd Portable 1V  Result Date: 11/14/2020 CLINICAL DATA:  Feeding tube placement. EXAM: PORTABLE ABDOMEN - 1 VIEW COMPARISON:  Same day. FINDINGS: Distal tip of feeding tube is seen in expected position of proximal jejunum in patient status post gastrojejunostomy. Percutaneous cholecystostomy is again noted. IMPRESSION: Distal tip of feeding tube seen in expected position of proximal jejunum. Electronically Signed   By: Marijo Conception M.D.   On: 11/14/2020 15:58   DG Abd Portable 1V  Result Date: 11/14/2020 CLINICAL DATA:  Feeding tube placement, history of gastric bypass EXAM: PORTABLE ABDOMEN - 1 VIEW COMPARISON:  CT abdomen/pelvis dated 11/09/2020 FINDINGS: Weighted feeding  tube in the left upper abdomen adjacent to surgical clips. It is unclear radiographically whether this is within the gastric pouch or within the jejunum distal to the gastrojejunostomy, although the latter is favored when correlating with prior CT. Pigtail drainage catheter in the right upper abdomen, related to percutaneous cholecystostomy. Degenerative changes of the lumbar spine. IMPRESSION: Weighted feeding tube in the left upper abdomen, likely within the jejunum distal to the gastrojejunostomy, less likely within the gastric pouch. Status post percutaneous cholecystostomy. Electronically Signed   By: Julian Hy M.D.   On: 11/14/2020 13:15   DG Abd Portable 1V  Result Date: 11/08/2020 CLINICAL DATA:  Check gastric catheter placement EXAM: PORTABLE ABDOMEN - 1 VIEW COMPARISON:  Film from earlier in the same day. FINDINGS: Gastric catheter is noted within the stomach. Proximal side port however is noted in the distal esophagus. This should be advanced several cm deeper into the stomach. Multiple dilated loops of small bowel are again identified consistent with at least partial small bowel obstruction. IMPRESSION: Persistent small bowel dilatation. Gastric catheter as described. This should be advanced deeper into the stomach. Electronically Signed   By: Inez Catalina M.D.   On: 11/08/2020 21:53   DG Abd Portable 1V  Result Date: 11/08/2020 CLINICAL DATA:  Abdominal pain and distention. EXAM: PORTABLE ABDOMEN - 1 VIEW COMPARISON:  No prior. FINDINGS: Surgical clips right upper quadrant and pelvis. Multiple dilated loops of small bowel noted. Relative paucity of colonic gas. Findings suggest small bowel obstruction. Follow-up abdominal series suggested for further evaluation. It is difficult to evaluate for free air as hemidiaphragms incompletely imaged. IVC filter noted. Degenerative change lumbar spine. IMPRESSION: Multiple  dilated loops of small bowel noted. Relative paucity of colonic gas.  Findings suggest small bowel obstruction. Follow-up abdominal series suggested for further evaluation. Electronically Signed   By: Marcello Moores  Register   On: 11/08/2020 16:35   VAS [place] LOWER EXTREMITY VENOUS (DVT)  Result Date: 11/12/2020  Lower Venous DVT Study Patient Name:  Tom Scott  Date of Exam:   11/12/2020 Medical Rec #: [id number]      Accession #:    [id number] Date of Birth: 09/17/1955      Patient Gender: M Patient Age:   65Y Exam Location:  [facility] Procedure:      VAS [place] LOWER EXTREMITY VENOUS (DVT) Referring Phys: [place] --------------------------------------------------------------------------------  Indications: Swelling, and Pain.  Risk Factors: None identified. Limitations: Body habitus, poor ultrasound/tissue interface and patient movement, patient pain tolerance, patient guarding. Comparison Study: No prior studies. Performing Technologist: Oliver Hum RVT  Examination Guidelines: A complete evaluation includes B-mode imaging, spectral Doppler, color Doppler, and power Doppler as needed of all accessible portions of each vessel. Bilateral testing is considered an integral part of a complete examination. Limited examinations for reoccurring indications may be performed as noted. The reflux portion of the exam is performed with the patient in reverse Trendelenburg.  +---------+---------------+---------+-----------+----------+-------------------+ RIGHT    CompressibilityPhasicitySpontaneityPropertiesThrombus Aging      +---------+---------------+---------+-----------+----------+-------------------+ CFV      Full           Yes      Yes                                      +---------+---------------+---------+-----------+----------+-------------------+ SFJ      Full                                                             +---------+---------------+---------+-----------+----------+-------------------+ FV Prox  Full                                                              +---------+---------------+---------+-----------+----------+-------------------+ FV Mid                  Yes      Yes                                      +---------+---------------+---------+-----------+----------+-------------------+ FV Distal               Yes      Yes                                      +---------+---------------+---------+-----------+----------+-------------------+ PFV  Not well visualized +---------+---------------+---------+-----------+----------+-------------------+ POP                     Yes      Yes                                      +---------+---------------+---------+-----------+----------+-------------------+ PTV      Full                                                             +---------+---------------+---------+-----------+----------+-------------------+ PERO     Full                                                             +---------+---------------+---------+-----------+----------+-------------------+   +----+---------------+---------+-----------+----------+--------------+ LEFTCompressibilityPhasicitySpontaneityPropertiesThrombus Aging +----+---------------+---------+-----------+----------+--------------+ CFV Full           Yes      Yes                                 +----+---------------+---------+-----------+----------+--------------+    Summary: RIGHT: - There is no evidence of deep vein thrombosis in the lower extremity. However, portions of this examination were limited- see technologist comments above.  - No cystic structure found in the popliteal fossa.  LEFT: - No evidence of common femoral vein obstruction.  *See table(s) above for measurements and observations. Electronically signed by Monica Martinez MD on 11/12/2020 at 5:17:16 PM.    Final    US Abdomen Limited RUQ (LIVER/GB)  Result Date: 11/08/2020 CLINICAL DATA:   Right upper quadrant abdominal pain EXAM: ULTRASOUND ABDOMEN LIMITED RIGHT UPPER QUADRANT COMPARISON:  None. FINDINGS: Gallbladder: The gallbladder is mildly distended and contains numerous layering gravel-like gallstones. There is mild pericholecystic fluid identified along the hepatics surface as well as mild gallbladder wall thickening noted. The sonographic Percell Miller sign is reportedly positive. Altogether, the findings are in keeping with changes of acute calculus cholecystitis. Common bile duct: Diameter: 4 mm Liver: No focal lesion identified. Within normal limits in parenchymal echogenicity. Portal vein is patent on color Doppler imaging with normal direction of blood flow towards the liver. Other: No ascites IMPRESSION: Acute calculus cholecystitis. No intra or extrahepatic biliary ductal dilation. Electronically Signed   By: Fidela Salisbury MD   On: 11/08/2020 22:20    Labs:  CBC: Recent Labs    11/15/20 0500 11/16/20 0500 11/17/20 0310 11/18/20 0302  WBC 17.1* 13.0* 11.3* 11.9*  HGB 8.9* 8.4* 7.7* 7.4*  HCT 24.2* 23.4* 21.5* 22.1*  PLT 96* 107* 128* 154    COAGS: Recent Labs    11/08/20 1426  INR 1.4*    BMP: Recent Labs    11/15/20 0500 11/16/20 0500 11/17/20 0310 11/18/20 0302  NA 136 135 133* 133*  K 3.2* 2.9* 3.1* 3.4*  CL 102 103 102 98  CO2 19* '24 22 25  '$ GLUCOSE 164* 164* 197* 165*  BUN 60* 50* 66* 47*  CALCIUM 8.3* 7.9* 7.8* 7.8*  CREATININE 5.76* 4.93* 5.90* 4.59*  GFRNONAA 10* 12* 10* 13*    LIVER FUNCTION TESTS: Recent Labs    11/12/20 0927 11/13/20 0657 11/14/20 0652 11/15/20 0500 11/16/20 0500 11/17/20 0310 11/18/20 0302  BILITOT 2.9* 2.6*  --   --   --  1.5* 1.4*  AST 45* 31  --   --   --  157* 175*  ALT 89* 70*  --   --   --  58* 79*  ALKPHOS 170* 177*  --   --   --  190* 191*  PROT 5.3* 5.6*  --   --   --  5.3* 5.7*  ALBUMIN 2.0* 2.0*   < > 1.7* 1.9* 1.7*  1.7* 1.9*  1.9*   < > = values in this interval not displayed.    Assessment  and Plan:  65 y.o. male inpatient. Known to IR. History of a fib (on Xarelto), HTN, DM. Presented to OSH with  persistent and worsening chest and abdominal pain X 2 week.  Found to be in septic shock with acute calculus cholecystitis with choledocholithiasis. Patient was transferred to [facility] where he was found to be in a fib with RVR.  IR placed cholecystomy tube on 5.20.22 by Dr. Annamaria Boots. Cultures from gallbladder shows abundant e coli predominately monocular. Hospital stay complicated by AKI. Patient has a left sided non tunneled HD catheter placed by critical care. Due to patient's Cr not improving Team is requesting a tunneled HD catheter for ongoing dialysis access.    BUN 47, Cr 4.55, GFR 13, WBC is 11.9 (down trending) Potassium 3.4  Total bilirubin 1.4. Patient is on eliquis. All other labs and medications are within acceptable parameters. Allergies include PCN.   IR consulted for possible tunneled HD catheter. Case has been reviewed and procedure approved by Dr. Serafina Royals.  Patient tentatively scheduled for 5.20.22.  Team instructed to: Keep Patient to be NPO after midnight  IR will call patient when ready.  Risks and benefits discussed with the patient including, but not limited to bleeding, infection, vascular injury, pneumothorax which may require chest tube placement, air embolism or even death  All of the patient's questions were answered, patient is agreeable to proceed. Consent signed and in chart.   Thank you for this interesting consult.  I greatly enjoyed meeting Trevel Niccoli and look forward to participating in their care.  A copy of this report was sent to the requesting provider on this date.  Electronically Signed: Jacqualine Mau, NP 11/18/2020, 5:07 PM   I spent a total of 40 Minutes  in face to face in clinical consultation, greater than 50% of which was counseling/coordinating care for tunneled HD catheter.

## 2020-11-19 ENCOUNTER — Inpatient Hospital Stay (HOSPITAL_COMMUNITY): Payer: Medicare (Managed Care)

## 2020-11-19 DIAGNOSIS — A419 Sepsis, unspecified organism: Secondary | ICD-10-CM | POA: Diagnosis not present

## 2020-11-19 DIAGNOSIS — R6521 Severe sepsis with septic shock: Secondary | ICD-10-CM | POA: Diagnosis not present

## 2020-11-19 HISTORY — PX: IR US GUIDE VASC ACCESS RIGHT: IMG2390

## 2020-11-19 HISTORY — PX: IR FLUORO GUIDE CV LINE RIGHT: IMG2283

## 2020-11-19 LAB — RENAL FUNCTION PANEL
Albumin: 1.7 g/dL — ABNORMAL LOW (ref 3.5–5.0)
Anion gap: 12 (ref 5–15)
BUN: 65 mg/dL — ABNORMAL HIGH (ref 8–23)
CO2: 22 mmol/L (ref 22–32)
Calcium: 8 mg/dL — ABNORMAL LOW (ref 8.9–10.3)
Chloride: 98 mmol/L (ref 98–111)
Creatinine, Ser: 6.04 mg/dL — ABNORMAL HIGH (ref 0.61–1.24)
GFR, Estimated: 10 mL/min — ABNORMAL LOW (ref >60)
Glucose, Bld: 161 mg/dL — ABNORMAL HIGH (ref 70–99)
Phosphorus: 5.2 mg/dL — ABNORMAL HIGH (ref 2.5–4.6)
Potassium: 3.2 mmol/L — ABNORMAL LOW (ref 3.5–5.1)
Sodium: 132 mmol/L — ABNORMAL LOW (ref 135–145)

## 2020-11-19 LAB — HEPATIC FUNCTION PANEL
ALT: 48 U/L — ABNORMAL HIGH (ref 0–44)
AST: 60 U/L — ABNORMAL HIGH (ref 15–41)
Albumin: 1.7 g/dL — ABNORMAL LOW (ref 3.5–5.0)
Alkaline Phosphatase: 159 U/L — ABNORMAL HIGH (ref 38–126)
Bilirubin, Direct: 0.4 mg/dL — ABNORMAL HIGH (ref 0.0–0.2)
Indirect Bilirubin: 0.9 mg/dL (ref 0.3–0.9)
Total Bilirubin: 1.3 mg/dL — ABNORMAL HIGH (ref 0.3–1.2)
Total Protein: 5.9 g/dL — ABNORMAL LOW (ref 6.5–8.1)

## 2020-11-19 LAB — PROTIME-INR
INR: 1.8 — ABNORMAL HIGH (ref 0.8–1.2)
Prothrombin Time: 20.8 seconds — ABNORMAL HIGH (ref 11.4–15.2)

## 2020-11-19 LAB — CBC
HCT: 23.3 % — ABNORMAL LOW (ref 39.0–52.0)
Hemoglobin: 7.3 g/dL — ABNORMAL LOW (ref 13.0–17.0)
MCH: 26.3 pg (ref 26.0–34.0)
MCHC: 31.3 g/dL (ref 30.0–36.0)
MCV: 83.8 fL (ref 80.0–100.0)
Platelets: 216 10*3/uL (ref 150–400)
RBC: 2.78 MIL/uL — ABNORMAL LOW (ref 4.22–5.81)
RDW: 21.7 % — ABNORMAL HIGH (ref 11.5–15.5)
WBC: 13.2 10*3/uL — ABNORMAL HIGH (ref 4.0–10.5)
nRBC: 0 % (ref 0.0–0.2)

## 2020-11-19 LAB — GLUCOSE, CAPILLARY
Glucose-Capillary: 165 mg/dL — ABNORMAL HIGH (ref 70–99)
Glucose-Capillary: 170 mg/dL — ABNORMAL HIGH (ref 70–99)
Glucose-Capillary: 178 mg/dL — ABNORMAL HIGH (ref 70–99)
Glucose-Capillary: 187 mg/dL — ABNORMAL HIGH (ref 70–99)
Glucose-Capillary: 236 mg/dL — ABNORMAL HIGH (ref 70–99)

## 2020-11-19 MED ORDER — PROSOURCE PLUS PO LIQD
30.0000 mL | Freq: Two times a day (BID) | ORAL | Status: DC
  Administered 2020-11-19 – 2020-12-01 (×22): 30 mL via ORAL
  Filled 2020-11-19 (×23): qty 30

## 2020-11-19 MED ORDER — BOOST PLUS PO LIQD
237.0000 mL | Freq: Three times a day (TID) | ORAL | Status: DC
  Administered 2020-11-20 – 2020-12-01 (×27): 237 mL via ORAL
  Filled 2020-11-19 (×41): qty 237

## 2020-11-19 MED ORDER — MIDAZOLAM HCL 2 MG/2ML IJ SOLN
INTRAMUSCULAR | Status: AC
  Filled 2020-11-19: qty 2

## 2020-11-19 MED ORDER — FENTANYL CITRATE (PF) 100 MCG/2ML IJ SOLN
INTRAMUSCULAR | Status: AC
  Filled 2020-11-19: qty 2

## 2020-11-19 MED ORDER — HEPARIN SODIUM (PORCINE) 1000 UNIT/ML IJ SOLN
INTRAMUSCULAR | Status: AC
  Filled 2020-11-19: qty 1

## 2020-11-19 MED ORDER — ASCORBIC ACID 500 MG PO TABS
500.0000 mg | ORAL_TABLET | Freq: Two times a day (BID) | ORAL | Status: DC
  Administered 2020-11-19 – 2020-12-01 (×25): 500 mg via ORAL
  Filled 2020-11-19 (×26): qty 1

## 2020-11-19 MED ORDER — VANCOMYCIN HCL IN DEXTROSE 1-5 GM/200ML-% IV SOLN
INTRAVENOUS | Status: AC
  Administered 2020-11-19: 1000 mg
  Filled 2020-11-19: qty 200

## 2020-11-19 MED ORDER — OXYCODONE HCL 5 MG PO TABS
5.0000 mg | ORAL_TABLET | Freq: Four times a day (QID) | ORAL | Status: DC | PRN
Start: 2020-11-19 — End: 2020-12-02
  Administered 2020-11-19 – 2020-11-25 (×2): 5 mg via ORAL
  Filled 2020-11-19: qty 1
  Filled 2020-11-19: qty 2
  Filled 2020-11-19 (×2): qty 1

## 2020-11-19 MED ORDER — FENTANYL CITRATE (PF) 100 MCG/2ML IJ SOLN
INTRAMUSCULAR | Status: AC | PRN
  Administered 2020-11-19: 50 ug via INTRAVENOUS

## 2020-11-19 MED ORDER — SODIUM CHLORIDE 0.9 % IV BOLUS
250.0000 mL | Freq: Once | INTRAVENOUS | Status: AC
  Administered 2020-11-19: 250 mL via INTRAVENOUS

## 2020-11-19 MED ORDER — LIDOCAINE-EPINEPHRINE 1 %-1:100000 IJ SOLN
INTRAMUSCULAR | Status: AC
  Filled 2020-11-19: qty 1

## 2020-11-19 MED ORDER — INSULIN ASPART 100 UNIT/ML IJ SOLN
0.0000 [IU] | Freq: Three times a day (TID) | INTRAMUSCULAR | Status: DC
  Administered 2020-11-19: 2 [IU] via SUBCUTANEOUS
  Administered 2020-11-20 – 2020-11-24 (×8): 1 [IU] via SUBCUTANEOUS
  Administered 2020-11-25 (×3): 2 [IU] via SUBCUTANEOUS
  Administered 2020-11-26 – 2020-11-28 (×6): 1 [IU] via SUBCUTANEOUS
  Administered 2020-11-28: 2 [IU] via SUBCUTANEOUS
  Administered 2020-11-29 – 2020-12-01 (×7): 1 [IU] via SUBCUTANEOUS

## 2020-11-19 MED ORDER — ACETAMINOPHEN 325 MG PO TABS
650.0000 mg | ORAL_TABLET | ORAL | Status: DC | PRN
  Administered 2020-11-19 – 2020-12-01 (×7): 650 mg via ORAL
  Filled 2020-11-19 (×8): qty 2

## 2020-11-19 NOTE — Progress Notes (Addendum)
Nutrition Follow-up  DOCUMENTATION CODES:   Severe malnutrition in context of acute illness/injury  INTERVENTION:   - Trial Boost Plus chocolate TID- Each supplement provides 360kcal and 14g protein.    - 30 ml ProSource Plus BID, each supplement provides 100 kcals and 15 grams protein.   - MVI with minerals BID per tube  - 500 mg calcium carbonate TID per tube  - Supplement Vitamin C 500 mg BID  NUTRITION DIAGNOSIS:   Severe Malnutrition related to acute illness (cholecystitis) as evidenced by mild fat depletion,moderate muscle depletion,energy intake < or equal to 50% for > or equal to 5 days. Ongoing  GOAL:   Patient will meet greater than or equal to 90% of their needs  Not meeting  MONITOR:   Diet advancement,Labs,Weight trends,TF tolerance,Skin,I & O's  REASON FOR ASSESSMENT:   NPO/Clear Liquid Diet    ASSESSMENT:   65 year old male who presented on 5/19 from [facility] with septic shock, cholecystitis. 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 Pt admitted with acute calculous cholecystitis, AKI.  5/20 - NG tube placed, percutaneous cholecystostomy tube placement 5/21 - NG tube removed, clear liquids 5/23 - first HD, NPO 5/25 - Cortrak placed (in jejunum), diet advanced to Regular 5/28 - Cortrak pulled   No signs of renal recovery at this time.   Intake slow to progress per wife. Calorie count started yesterday. See results below. Patient does not like taste of hospital food. Not taking Ensure given ongoing diarrhea. Patient currently on renal diet. Okay to liberalize to regular to provide more option (per nephrology). Suspect diarrhea is medication related. Will change supplement to lactose free Boost per wife request. RD encouraged intake as patient is severely malnourished. If unable to progress patient will need to have Cortrak replaced.   Vitamin C low. Okay to supplement 500 mg BID per  nephrology.   Day 1 Calorie Count Results  Lunch: 153 kcal, 8 grams protein Dinner: 109 kcal, 5 grams protein Breakfast: NPO Supplements: 350 kcal, 20 grams protein  Total intake: 612 kcal (28% of minimum estimated needs)  33 protein (26% of minimum estimated needs)  Admit with: 98 kg Current weight: 119 kg  Vitamin/Mineral Profile:  Thiamine B1: pending Vitamin B12: 2699 (H) Vitamin A: pending Vitamin D: 17.47 (L) Vitamin C: <0.1 (L) Zinc: 37 (L)  Perc chole drain: 250 ml x 24 hours  Medications: TUMS TID, Vitamin D, aranesp, MVI x2, zinc sulfate 220 mg  Labs: K 3.2 (L) Na 132 (L) Phosphors 5.2 (wdl for HD) CBG 165-187  Diet Order:   Diet Order            Diet regular Room service appropriate? Yes; Fluid consistency: Thin  Diet effective now                 EDUCATION NEEDS:   No education needs have been identified at this time  Skin:  Skin Assessment: Skin Integrity Issues: Stage II: coccyx  Last BM:  5/29- loose per wife  Height:   Ht Readings from Last 1 Encounters:  11/08/20 '5\' 10"'$  (1.778 m)    Weight:   Wt Readings from Last 1 Encounters:  11/19/20 119 kg    BMI:  Body mass index is 37.64 kg/m.  Estimated Nutritional Needs:   Kcal:  2250-2450  Protein:  125-145 grams  Fluid:  >/= 2.0 L  Mariana Single RD, LDN Clinical Nutrition Pager listed in [place]

## 2020-11-19 NOTE — Progress Notes (Signed)
Report given to Naomi, RN

## 2020-11-19 NOTE — Progress Notes (Signed)
LFTs noted to be back to what they were earlier last week with a decline in alkaline phosphatase.  In light of that, it would not seem necessary to make any other medication changes such as discontinue the amiodarone.  We will have the GI consult team see the patient tomorrow.

## 2020-11-19 NOTE — Progress Notes (Signed)
   11/19/20 2013  Assess: MEWS Score  Temp 99.6 F (37.6 C)  BP 113/66  Pulse Rate (!) 102  Resp (!) 30  Level of Consciousness Alert  SpO2 95 %  O2 Device Room Air  Assess: MEWS Score  MEWS Temp 0  MEWS Systolic 0  MEWS Pulse 1  MEWS RR 2  MEWS LOC 0  MEWS Score 3  MEWS Score Color Yellow  Assess: if the MEWS score is Yellow or Red  Were vital signs taken at a resting state? Yes  Focused Assessment Change from prior assessment (see assessment flowsheet)  Early Detection of Sepsis Score *See Row Information* Low  MEWS guidelines implemented *See Row Information* Yes  Treat  Pain Scale 0-10  Pain Score 4  Pain Type Acute pain  Pain Location Abdomen  Pain Orientation Upper;Mid  Pain Descriptors / Indicators Discomfort  Pain Frequency Constant  Pain Onset On-going  Patients Stated Pain Goal 0  Pain Intervention(s) MD notified (Comment);Repositioned;RN made aware  Complains of Other (Comment) (Generalized soreness in extremities when moving)  Take Vital Signs  Increase Vital Sign Frequency  Yellow: Q 2hr X 2 then Q 4hr X 2, if remains yellow, continue Q 4hrs  Escalate  MEWS: Escalate Yellow: discuss with charge nurse/RN and consider discussing with provider and RRT  Notify: Charge Nurse/RN  Name of Charge Nurse/RN Notified Lourdes, RN  Date Charge Nurse/RN Notified 11/19/20  Time Charge Nurse/RN Notified 2013  Notify: Provider  Provider Name/Title Argie Ramming, MD  Date Provider Notified 11/19/20  Time Provider Notified 2027  Notification Type Page  Notification Reason Change in status  Document  Progress note created (see row info) Yes

## 2020-11-19 NOTE — Procedures (Signed)
Interventional Radiology Procedure Note  Procedure: Tunneled hemodialysis catheter placement  Findings: Please refer to procedural dictation for full description. Right IJ 14.5 Fr, 23 cm dual lumen tunneled hemodialysis catheter placed with tip in right atrium.    Complications: None immediate  Estimated Blood Loss: < 5 mL  Recommendations: Catheter ready for immediate use.   Ruthann Cancer, MD

## 2020-11-19 NOTE — Consult Note (Signed)
[facility]  Total days of antibiotics 12               Reason for Consult: esbl ecoli and gallbladder infection    Referring Physician: nettey  Principal Problem:   Septic shock ([place]) Active Problems:   Pressure injury of skin   Protein-calorie malnutrition, severe   Encounter for central line placement   AKI (acute kidney injury) ([place])    HPI: Tom Scott is a 65 y.o. male with history of DM, afib on chronic anticoagulation, hx of roux-en-y bypass/bairactir gastrojejunostomy, HTN who had roughly 2 weeks of abdominal pain prior to admission to outside hospital for septic shock with acute calculus cholecystitis. He was found to be bacteremic with esbl ecoli and b.fragilis at outside hospital and subsequently transferred here on 5/18 for further management. IR placed cholecystomy tube on 5/20. cx growing esbl ecoli and b.fragilis. He was deemed not to be surgical candidate to try medical management. He has developed ATN AKI now requiring HD. He was ruled out for CBD stones by U/S and CTAP showing no biliary ductal dilatation, thus not requiring ERCP/or MRCP. His GB draining roughly 200-359m per day. He did have NG in place but subsequently pulled out. He is trying to augment in oral intake with his wife at his bedside but it has been slow to meet his nutritional needs. He is tolerating oral intake without abtx. Has loose stool. He is diffusely week per his wife. Sore when moving his extremities.  Past Medical History:  Diagnosis Date  . [place]rial fibrillation ([place]   . Diabetes mellitus ([place]   . Hypertension     Allergies:  Allergies  Allergen Reactions  . Penicillamine     Other reaction(s): lips swell  . Penicillins     Other reaction(s): Swelling  Legacy System: CCA Onset Date: <blank> Substance Legacy/Cerner: penicillins / penicillins (Legacy value) Category: Drug Severity Legacy/Cerner: <blank> / Unknown Reaction(s): swelling Comments:  <blank>  Legacy System: CCA Onset Date: <blank> Substance Legacy/Cerner: ampicillin / ampicillin (Legacy value) Category: Drug Severity Legacy/Cerner: <blank> / Unknown Reaction(s): swelling Comments: <blank>  Legacy System: CCA Onset Date: <blank> Substance Legacy/Cerner: ampicillin / ampicillin (Legacy value) Category: Drug Severity Legacy/Cerner: <blank> / Unknown Reaction(s): swelling Comments: <blank>  Legacy System: CCA Onset Date: <blank> Substance Legacy/Cerner: penicillins / penicillins (Legacy value) Category: Drug Severity Legacy/Cerner: <blank> / Unknown Reaction(s): swelling Comments: <blank>      MEDICATIONS: . amiodarone  200 mg Oral Daily  . apixaban  5 mg Oral BID  . calcium carbonate  1 tablet Oral TID  . Chlorhexidine Gluconate Cloth  6 each Topical Q0600  . cholecalciferol  1,000 Units Oral Daily  . darbepoetin (ARANESP) injection - NON-DIALYSIS  40 mcg Subcutaneous Q Sat-1800  . feeding supplement  237 mL Oral TID BM  . fentaNYL      . heparin sodium (porcine)      . iron polysaccharides  150 mg Oral Daily  . lidocaine  1 patch Transdermal Q24H  . lidocaine-EPINEPHrine      . midazolam      . multivitamin with minerals  1 tablet Oral BID  . rosuvastatin  10 mg Oral Daily  . sodium chloride flush  10-40 mL Intracatheter Q12H  . sodium chloride flush  5 mL Intracatheter Q8H  . zinc sulfate  220 mg Oral Daily       Family hx: CAD, arthritis  Review of Systems  Constitutional: Negative for fever, chills,  diaphoresis, activity change, appetite change, fatigue and unexpected weight change.  HENT: Negative for congestion, sore throat, rhinorrhea, sneezing, trouble swallowing and sinus pressure.  Eyes: Negative for photophobia and visual disturbance.  Respiratory: Negative for cough, chest tightness, shortness of breath, wheezing and stridor.  Cardiovascular: Negative for chest pain, palpitations and leg swelling.  Gastrointestinal: Negative  for nausea, vomiting, abdominal pain, diarrhea, constipation, blood in stool, abdominal distention and anal bleeding.  Genitourinary: Negative for dysuria, hematuria, flank pain and difficulty urinating.  Musculoskeletal: Negative for myalgias, back pain, joint swelling, arthralgias and gait problem.  Skin: Negative for color change, pallor, rash and wound.  Neurological: Negative for dizziness, tremors, weakness and light-headedness.  Hematological: Negative for adenopathy. Does not bruise/bleed easily.  Psychiatric/Behavioral: Negative for behavioral problems, confusion, sleep disturbance, dysphoric mood, decreased concentration and agitation.     OBJECTIVE: Temp:  [98.5 F (36.9 C)-101.1 F (38.4 C)] 99.5 F (37.5 C) (05/30 0922) Pulse Rate:  [89-113] 106 (05/30 0922) Resp:  [15-27] 27 (05/30 0845) BP: (101-123)/(53-71) 115/66 (05/30 0922) SpO2:  [92 %-100 %] 96 % (05/30 0922) Weight:  [161 kg] 119 kg (05/30 0500) Physical Exam  Constitutional: He is oriented to person, place, and time. He appears chronically ill,older than stated age and mal-nourished. No distress.  HENT: pallor Mouth/Throat: Oropharynx is clear and moist. No oropharyngeal exudate. Left IJ, right HD catheter Cardiovascular: Normal rate, regular rhythm and normal heart sounds. Exam reveals no gallop and no friction rub.  No murmur heard.  Pulmonary/Chest: Effort normal and breath sounds normal. No respiratory distress. He has no wheezes.  Abdominal: Soft. Bowel sounds are normal. He exhibits no distension. There is no tenderness. Chole drain in place Ext: +anasarca to all extremities Neurological: He is alert and oriented to person, place, and time.  Skin: Skin is warm and dry. No rash noted. No erythema.  Psychiatric: He has a normal mood and affect. His behavior is normal.     LABS: Results for orders placed or performed during the hospital encounter of 11/08/20 (from the past 48 hour(s))  Ammonia      Status: None   Collection Time: 11/17/20  1:30 PM  Result Value Ref Range   Ammonia 27 9 - 35 umol/L    Comment: Performed at [facility], [address], [place], [place] [address]  Glucose, capillary     Status: Abnormal   Collection Time: 11/17/20  6:14 PM  Result Value Ref Range   Glucose-Capillary 178 (H) 70 - 99 mg/dL    Comment: Glucose reference range applies only to samples taken after fasting for at least 8 hours.   Comment 1 Notify RN    Comment 2 Document in Chart   Glucose, capillary     Status: Abnormal   Collection Time: 11/17/20  8:11 PM  Result Value Ref Range   Glucose-Capillary 234 (H) 70 - 99 mg/dL    Comment: Glucose reference range applies only to samples taken after fasting for at least 8 hours.  Glucose, capillary     Status: Abnormal   Collection Time: 11/17/20 11:52 PM  Result Value Ref Range   Glucose-Capillary 158 (H) 70 - 99 mg/dL    Comment: Glucose reference range applies only to samples taken after fasting for at least 8 hours.  Renal function panel     Status: Abnormal   Collection Time: 11/18/20  3:02 AM  Result Value Ref Range   Sodium 133 (L) 135 - 145 mmol/L   Potassium  3.4 (L) 3.5 - 5.1 mmol/L   Chloride 98 98 - 111 mmol/L   CO2 25 22 - 32 mmol/L   Glucose, Bld 165 (H) 70 - 99 mg/dL    Comment: Glucose reference range applies only to samples taken after fasting for at least 8 hours.   BUN 47 (H) 8 - 23 mg/dL   Creatinine, Ser 4.59 (H) 0.61 - 1.24 mg/dL   Calcium 7.8 (L) 8.9 - 10.3 mg/dL   Phosphorus 3.8 2.5 - 4.6 mg/dL   Albumin 1.9 (L) 3.5 - 5.0 g/dL   GFR, Estimated 13 (L) >60 mL/min    Comment: (NOTE) Calculated using the CKD-EPI Creatinine Equation (2021)    Anion gap 10 5 - 15    Comment: Performed at [facility] [address], [place], [place] [address]  CBC     Status: Abnormal   Collection Time: 11/18/20  3:02 AM  Result Value Ref Range   WBC 11.9 (H) 4.0 - 10.5 K/uL   RBC 2.86 (L) 4.22 - 5.81 MIL/uL    Hemoglobin 7.4 (L) 13.0 - 17.0 g/dL    Comment: Reticulocyte Hemoglobin testing may be clinically indicated, consider ordering this additional test [id number]    HCT 22.1 (L) 39.0 - 52.0 %   MCV 77.3 (L) 80.0 - 100.0 fL   MCH 25.9 (L) 26.0 - 34.0 pg   MCHC 33.5 30.0 - 36.0 g/dL   RDW 21.2 (H) 11.5 - 15.5 %   Platelets 154 150 - 400 K/uL   nRBC 0.0 0.0 - 0.2 %    Comment: Performed at [facility], [place] [address], [place], [place] [address]  Hepatic function panel     Status: Abnormal   Collection Time: 11/18/20  3:02 AM  Result Value Ref Range   Total Protein 5.7 (L) 6.5 - 8.1 g/dL   Albumin 1.9 (L) 3.5 - 5.0 g/dL   AST 175 (H) 15 - 41 U/L   ALT 79 (H) 0 - 44 U/L   Alkaline Phosphatase 191 (H) 38 - 126 U/L   Total Bilirubin 1.4 (H) 0.3 - 1.2 mg/dL   Bilirubin, Direct 0.5 (H) 0.0 - 0.2 mg/dL   Indirect Bilirubin 0.9 0.3 - 0.9 mg/dL    Comment: Performed at [facility] [address] [place], [place] [address]  Glucose, capillary     Status: Abnormal   Collection Time: 11/18/20  4:22 AM  Result Value Ref Range   Glucose-Capillary 164 (H) 70 - 99 mg/dL    Comment: Glucose reference range applies only to samples taken after fasting for at least 8 hours.  Glucose, capillary     Status: Abnormal   Collection Time: 11/18/20  7:22 AM  Result Value Ref Range   Glucose-Capillary 129 (H) 70 - 99 mg/dL    Comment: Glucose reference range applies only to samples taken after fasting for at least 8 hours.  Glucose, capillary     Status: Abnormal   Collection Time: 11/18/20 11:42 AM  Result Value Ref Range   Glucose-Capillary 185 (H) 70 - 99 mg/dL    Comment: Glucose reference range applies only to samples taken after fasting for at least 8 hours.  Glucose, capillary     Status: Abnormal   Collection Time: 11/18/20  3:48 PM  Result Value Ref Range   Glucose-Capillary 164 (H) 70 - 99 mg/dL    Comment: Glucose reference range applies only to samples taken after fasting for at  least  8 hours.  Culture, blood (routine x 2)     Status: None (Preliminary result)   Collection Time: 11/18/20  4:57 PM   Specimen: BLOOD RIGHT HAND  Result Value Ref Range   Specimen Description BLOOD RIGHT HAND    Special Requests      BOTTLES DRAWN AEROBIC AND ANAEROBIC Blood Culture adequate volume   Culture      NO GROWTH < 12 HOURS Performed at [facility], [place] [address] [place], [place] [address]    Report Status PENDING   Culture, blood (routine x 2)     Status: None (Preliminary result)   Collection Time: 11/18/20  4:59 PM   Specimen: BLOOD LEFT HAND  Result Value Ref Range   Specimen Description BLOOD LEFT HAND    Special Requests      BOTTLES DRAWN AEROBIC AND ANAEROBIC Blood Culture adequate volume   Culture      NO GROWTH < 12 HOURS Performed at [facility], [place] [address] [place], [place] [address]    Report Status PENDING   Glucose, capillary     Status: Abnormal   Collection Time: 11/18/20  8:21 PM  Result Value Ref Range   Glucose-Capillary 171 (H) 70 - 99 mg/dL    Comment: Glucose reference range applies only to samples taken after fasting for at least 8 hours.  Glucose, capillary     Status: Abnormal   Collection Time: 11/19/20 12:05 AM  Result Value Ref Range   Glucose-Capillary 165 (H) 70 - 99 mg/dL    Comment: Glucose reference range applies only to samples taken after fasting for at least 8 hours.  Renal function panel     Status: Abnormal   Collection Time: 11/19/20  3:27 AM  Result Value Ref Range   Sodium 132 (L) 135 - 145 mmol/L   Potassium 3.2 (L) 3.5 - 5.1 mmol/L   Chloride 98 98 - 111 mmol/L   CO2 22 22 - 32 mmol/L   Glucose, Bld 161 (H) 70 - 99 mg/dL    Comment: Glucose reference range applies only to samples taken after fasting for at least 8 hours.   BUN 65 (H) 8 - 23 mg/dL   Creatinine, Ser 6.04 (H) 0.61 - 1.24 mg/dL   Calcium 8.0 (L) 8.9 - 10.3 mg/dL   Phosphorus 5.2 (H) 2.5 - 4.6 mg/dL   Albumin 1.7 (L) 3.5 - 5.0  g/dL   GFR, Estimated 10 (L) >60 mL/min    Comment: (NOTE) Calculated using the CKD-EPI Creatinine Equation (2021)    Anion gap 12 5 - 15    Comment: Performed at [facility] [address], [place], [place] [address]  CBC     Status: Abnormal   Collection Time: 11/19/20  3:27 AM  Result Value Ref Range   WBC 13.2 (H) 4.0 - 10.5 K/uL   RBC 2.78 (L) 4.22 - 5.81 MIL/uL   Hemoglobin 7.3 (L) 13.0 - 17.0 g/dL   HCT 23.3 (L) 39.0 - 52.0 %   MCV 83.8 80.0 - 100.0 fL    Comment: REPEATED TO VERIFY DELTA CHECK NOTED    MCH 26.3 26.0 - 34.0 pg   MCHC 31.3 30.0 - 36.0 g/dL   RDW 21.7 (H) 11.5 - 15.5 %   Platelets 216 150 - 400 K/uL   nRBC 0.0 0.0 - 0.2 %    Comment: Performed at [facility], [place] [address], [place], [place] [address]  Hepatic  function panel     Status: Abnormal   Collection Time: 11/19/20  3:27 AM  Result Value Ref Range   Total Protein 5.9 (L) 6.5 - 8.1 g/dL   Albumin 1.7 (L) 3.5 - 5.0 g/dL   AST 60 (H) 15 - 41 U/L   ALT 48 (H) 0 - 44 U/L   Alkaline Phosphatase 159 (H) 38 - 126 U/L   Total Bilirubin 1.3 (H) 0.3 - 1.2 mg/dL   Bilirubin, Direct 0.4 (H) 0.0 - 0.2 mg/dL   Indirect Bilirubin 0.9 0.3 - 0.9 mg/dL    Comment: Performed at [facility] [address], [place], [place] [address]  Protime-INR     Status: Abnormal   Collection Time: 11/19/20  3:27 AM  Result Value Ref Range   Prothrombin Time 20.8 (H) 11.4 - 15.2 seconds   INR 1.8 (H) 0.8 - 1.2    Comment: (NOTE) INR goal varies based on device and disease states. Performed at [facility], [place] [address], [place], [place] [address]   Glucose, capillary     Status: Abnormal   Collection Time: 11/19/20  9:44 AM  Result Value Ref Range   Glucose-Capillary 178 (H) 70 - 99 mg/dL    Comment: Glucose reference range applies only to samples taken after fasting for at least 8 hours.  Glucose, capillary     Status: Abnormal   Collection Time: 11/19/20 11:54 AM  Result Value Ref Range    Glucose-Capillary 187 (H) 70 - 99 mg/dL    Comment: Glucose reference range applies only to samples taken after fasting for at least 8 hours.    MICRO: esbl ecoli and b.fragilis (GB cx) Organism ID, Bacteria ESCHERICHIA COLI   Resulting Agency [facility]      Susceptibility   Escherichia coli    MIC    AMPICILLIN >=32 RESIST... Resistant    AMPICILLIN/SULBACTAM 4 SENSITIVE  Sensitive    CEFAZOLIN >=64 RESIST... Resistant    CEFEPIME 2 SENSITIVE  Sensitive    CEFTAZIDIME RESISTANT  Resistant    CEFTRIAXONE >=64 RESIST... Resistant    CIPROFLOXACIN >=4 RESISTANT  Resistant    GENTAMICIN <=1 SENSITIVE  Sensitive    IMIPENEM <=0.25 SENS... Sensitive    PIP/TAZO <=4 SENSITIVE  Sensitive    TRIMETH/SULFA >=320 RESIS... Resistant           IMAGING: IR Fluoro Guide CV Line Right  Result Date: 11/19/2020 INDICATION: 65 year old male with history of acute kidney injury requiring long-term hemodialysis. EXAM: TUNNELED CENTRAL VENOUS HEMODIALYSIS CATHETER PLACEMENT WITH ULTRASOUND AND FLUOROSCOPIC GUIDANCE MEDICATIONS: Vancomycin 1 gm IV . The antibiotic was given in an appropriate time interval prior to skin puncture. ANESTHESIA/SEDATION: Moderate (conscious) sedation was employed during this procedure. A total of Versed 1 mg and Fentanyl 100 mcg was administered intravenously. Moderate Sedation Time: 13 minutes. The patient's level of consciousness and vital signs were monitored continuously by radiology nursing throughout the procedure under my direct supervision. FLUOROSCOPY TIME:  0 minutes 12 seconds (6 mGy). COMPLICATIONS: None immediate. PROCEDURE: Informed written consent was obtained from the patient after a discussion of the risks, benefits, and alternatives to treatment. Questions regarding the procedure were encouraged and answered. The right neck and chest were prepped with chlorhexidine in a sterile fashion, and a sterile drape was applied covering the operative field. Maximum  barrier sterile technique with sterile gowns and gloves were used for the procedure. A timeout was performed prior to the initiation of the procedure.  After creating a small venotomy incision, a 21 gauge micropuncture kit was utilized to access the internal jugular vein. Real-time ultrasound guidance was utilized for vascular access including the acquisition of a permanent ultrasound image documenting patency of the accessed vessel. A Rosen wire was advanced to the level of the IVC and the micropuncture sheath was exchanged for an 8 Fr dilator. A 14.5 French tunneled hemodialysis catheter measuring 23 cm from tip to cuff was tunneled in a retrograde fashion from the anterior chest wall to the venotomy incision. Serial dilation was then performed an a peel-away sheath was placed. The catheter was then placed through the peel-away sheath with the catheter tip ultimately positioned within the right atrium. Final catheter positioning was confirmed and documented with a spot radiographic image. The catheter aspirates and flushes normally. The catheter was flushed with appropriate volume heparin dwells. The catheter exit site was secured with a 0-Silk retention suture. The venotomy incision was closed with Dermabond. Sterile dressings were applied. The patient tolerated the procedure well without immediate post procedural complication. IMPRESSION: Successful placement of 23 cm tip to cuff tunneled hemodialysis catheter via the right internal jugular vein with catheter tip terminating within the right atrium. The catheter is ready for immediate use. Ruthann Cancer, MD [facility] Radiology Electronically Signed   By: Ruthann Cancer MD   On: 11/19/2020 11:06   IR US Guide Vasc Access Right  Result Date: 11/19/2020 INDICATION: 65 year old male with history of acute kidney injury requiring long-term hemodialysis. EXAM: TUNNELED CENTRAL VENOUS HEMODIALYSIS CATHETER PLACEMENT  WITH ULTRASOUND AND FLUOROSCOPIC GUIDANCE MEDICATIONS: Vancomycin 1 gm IV . The antibiotic was given in an appropriate time interval prior to skin puncture. ANESTHESIA/SEDATION: Moderate (conscious) sedation was employed during this procedure. A total of Versed 1 mg and Fentanyl 100 mcg was administered intravenously. Moderate Sedation Time: 13 minutes. The patient's level of consciousness and vital signs were monitored continuously by radiology nursing throughout the procedure under my direct supervision. FLUOROSCOPY TIME:  0 minutes 12 seconds (6 mGy). COMPLICATIONS: None immediate. PROCEDURE: Informed written consent was obtained from the patient after a discussion of the risks, benefits, and alternatives to treatment. Questions regarding the procedure were encouraged and answered. The right neck and chest were prepped with chlorhexidine in a sterile fashion, and a sterile drape was applied covering the operative field. Maximum barrier sterile technique with sterile gowns and gloves were used for the procedure. A timeout was performed prior to the initiation of the procedure. After creating a small venotomy incision, a 21 gauge micropuncture kit was utilized to access the internal jugular vein. Real-time ultrasound guidance was utilized for vascular access including the acquisition of a permanent ultrasound image documenting patency of the accessed vessel. A Rosen wire was advanced to the level of the IVC and the micropuncture sheath was exchanged for an 8 Fr dilator. A 14.5 French tunneled hemodialysis catheter measuring 23 cm from tip to cuff was tunneled in a retrograde fashion from the anterior chest wall to the venotomy incision. Serial dilation was then performed an a peel-away sheath was placed. The catheter was then placed through the peel-away sheath with the catheter tip ultimately positioned within the right atrium. Final catheter positioning was confirmed and documented with a spot radiographic image.  The catheter aspirates and flushes normally. The catheter was flushed with appropriate volume heparin dwells. The catheter exit site was secured with a 0-Silk retention suture. The venotomy incision was closed with Dermabond. Sterile dressings were  applied. The patient tolerated the procedure well without immediate post procedural complication. IMPRESSION: Successful placement of 23 cm tip to cuff tunneled hemodialysis catheter via the right internal jugular vein with catheter tip terminating within the right atrium. The catheter is ready for immediate use. Ruthann Cancer, MD [facility] Radiology Electronically Signed   By: Ruthann Cancer MD   On: 11/19/2020 11:06   Assessment/Plan:  65yo M with severe sepsis for acute cholecystitis with secondary esbl ecoli and b.fragilis bacteremia, s/p GD drain placed on 5/20 ,further blood cx have been NGTD. Currently day 10  abtx since GB placed  esbl e.coli and b.fragilis bacteremia /acute cholecysitis= continue on meropenem through end of the week to complete 14 day course  transaminitis = improving;   tbili = improving; since GB drain in place  Leukocytosis = recommend to check cbc with diff tomorrow to see if continues to trend up  Severe protein calorie malnutrition = would continue advocate for him to improve caloric intake as much as possible  Anticipate prolonged recovery given current state of health.

## 2020-11-19 NOTE — Progress Notes (Signed)
Patient ID: Tom Scott, male   DOB: 11/19/1955, 65 y.o.   MRN: [id number]  [facility] Progress Note   Assessment/ Plan:   1. Acute kidney Injury: Secondary to septic shock/ATN and likely exacerbated by hemodynamic insult from atrial fibrillation with RVR.  Started on hemodialysis on 5/23 and unfortunately, without any evidence of renal recovery so far.  Earlier today underwent right IJ TDC placement by IR (their help highly appreciated) with plan for next hemodialysis tomorrow as he does not have any acute/compelling indication at this time.  Will order for placement of urinal by bedside to see if he has any urine output. 2.  Calculus cholecystitis with E. coli bacteremia: Status post percutaneous cholecystostomy on 5/20 with suspected amiodarone associated elevation of LFTs. 3.  Atrial fibrillation with rapid ventricular response: With intermittent tachycardia noted on oral amiodarone. 4.  Microcytic anemia: On oral iron and status post Aranesp-likely compounded by acute/critical illness. 5.  Physical deconditioning: Secondary to acute illness and lower extremity edema-awaiting PT/OT.  Subjective:   Reports to be feeling fair-somewhat tired after right IJ TDC procedure earlier today.   Objective:   BP 115/66   Pulse (!) 106   Temp 99.5 F (37.5 C) (Oral)   Resp (!) 27   Ht _0  (1.778 m)   Wt 119 kg   SpO2 96%   BMI 37.64 kg/m   Intake/Output Summary (Last 24 hours) at 11/19/2020 1110 Last data filed at 11/18/2020 2048 Gross per 24 hour  Intake 634 ml  Output 250 ml  Net 384 ml   Weight change: -1.5 kg  Physical Exam: Gen: Appears to be fatigued, resting comfortably in bed.  Wife at bedside CVS: Pulse irregularly irregular tachycardia, S1 and S2 normal. Resp: Anteriorly clear to auscultation, no rales/rhonchi.  Left IJ temporary dialysis catheter and right IJ TDC. Abd: Soft, obese, cholecystostomy tube in situ right upper quadrant Ext: 2+ pitting edema over  lower extremities  Imaging: IR Fluoro Guide CV Line Right  Result Date: 11/19/2020 INDICATION: 65 year old male with history of acute kidney injury requiring long-term hemodialysis. EXAM: TUNNELED CENTRAL VENOUS HEMODIALYSIS CATHETER PLACEMENT WITH ULTRASOUND AND FLUOROSCOPIC GUIDANCE MEDICATIONS: Vancomycin 1 gm IV . The antibiotic was given in an appropriate time interval prior to skin puncture. ANESTHESIA/SEDATION: Moderate (conscious) sedation was employed during this procedure. A total of Versed 1 mg and Fentanyl 100 mcg was administered intravenously. Moderate Sedation Time: 13 minutes. The patient's level of consciousness and vital signs were monitored continuously by radiology nursing throughout the procedure under my direct supervision. FLUOROSCOPY TIME:  0 minutes 12 seconds (6 mGy). COMPLICATIONS: None immediate. PROCEDURE: Informed written consent was obtained from the patient after a discussion of the risks, benefits, and alternatives to treatment. Questions regarding the procedure were encouraged and answered. The right neck and chest were prepped with chlorhexidine in a sterile fashion, and a sterile drape was applied covering the operative field. Maximum barrier sterile technique with sterile gowns and gloves were used for the procedure. A timeout was performed prior to the initiation of the procedure. After creating a small venotomy incision, a 21 gauge micropuncture kit was utilized to access the internal jugular vein. Real-time ultrasound guidance was utilized for vascular access including the acquisition of a permanent ultrasound image documenting patency of the accessed vessel. A Rosen wire was advanced to the level of the IVC and the micropuncture sheath was exchanged for an 8 Fr dilator. A 14.5 French tunneled hemodialysis catheter measuring 23 cm from tip to  cuff was tunneled in a retrograde fashion from the anterior chest wall to the venotomy incision. Serial dilation was then performed  an a peel-away sheath was placed. The catheter was then placed through the peel-away sheath with the catheter tip ultimately positioned within the right atrium. Final catheter positioning was confirmed and documented with a spot radiographic image. The catheter aspirates and flushes normally. The catheter was flushed with appropriate volume heparin dwells. The catheter exit site was secured with a 0-Silk retention suture. The venotomy incision was closed with Dermabond. Sterile dressings were applied. The patient tolerated the procedure well without immediate post procedural complication. IMPRESSION: Successful placement of 23 cm tip to cuff tunneled hemodialysis catheter via the right internal jugular vein with catheter tip terminating within the right atrium. The catheter is ready for immediate use. Ruthann Cancer, MD [facility] Radiology Electronically Signed   By: Ruthann Cancer MD   On: 11/19/2020 11:06   IR US Guide Vasc Access Right  Result Date: 11/19/2020 INDICATION: 65 year old male with history of acute kidney injury requiring long-term hemodialysis. EXAM: TUNNELED CENTRAL VENOUS HEMODIALYSIS CATHETER PLACEMENT WITH ULTRASOUND AND FLUOROSCOPIC GUIDANCE MEDICATIONS: Vancomycin 1 gm IV . The antibiotic was given in an appropriate time interval prior to skin puncture. ANESTHESIA/SEDATION: Moderate (conscious) sedation was employed during this procedure. A total of Versed 1 mg and Fentanyl 100 mcg was administered intravenously. Moderate Sedation Time: 13 minutes. The patient's level of consciousness and vital signs were monitored continuously by radiology nursing throughout the procedure under my direct supervision. FLUOROSCOPY TIME:  0 minutes 12 seconds (6 mGy). COMPLICATIONS: None immediate. PROCEDURE: Informed written consent was obtained from the patient after a discussion of the risks, benefits, and alternatives to treatment. Questions regarding the  procedure were encouraged and answered. The right neck and chest were prepped with chlorhexidine in a sterile fashion, and a sterile drape was applied covering the operative field. Maximum barrier sterile technique with sterile gowns and gloves were used for the procedure. A timeout was performed prior to the initiation of the procedure. After creating a small venotomy incision, a 21 gauge micropuncture kit was utilized to access the internal jugular vein. Real-time ultrasound guidance was utilized for vascular access including the acquisition of a permanent ultrasound image documenting patency of the accessed vessel. A Rosen wire was advanced to the level of the IVC and the micropuncture sheath was exchanged for an 8 Fr dilator. A 14.5 French tunneled hemodialysis catheter measuring 23 cm from tip to cuff was tunneled in a retrograde fashion from the anterior chest wall to the venotomy incision. Serial dilation was then performed an a peel-away sheath was placed. The catheter was then placed through the peel-away sheath with the catheter tip ultimately positioned within the right atrium. Final catheter positioning was confirmed and documented with a spot radiographic image. The catheter aspirates and flushes normally. The catheter was flushed with appropriate volume heparin dwells. The catheter exit site was secured with a 0-Silk retention suture. The venotomy incision was closed with Dermabond. Sterile dressings were applied. The patient tolerated the procedure well without immediate post procedural complication. IMPRESSION: Successful placement of 23 cm tip to cuff tunneled hemodialysis catheter via the right internal jugular vein with catheter tip terminating within the right atrium. The catheter is ready for immediate use. Ruthann Cancer, MD [facility] Radiology Electronically Signed   By: Ruthann Cancer MD   On: 11/19/2020 11:06   Labs: BMET Recent Labs  Lab  11/13/20 0657 11/14/20 0652 11/15/20 0500 11/16/20 0500 11/17/20 0310 11/18/20 0302 11/19/20 0327  NA 137 136 136 135 133* 133* 132*  K 3.9 3.5 3.2* 2.9* 3.1* 3.4* 3.2*  CL 105 102 102 103 102 98 98  CO2 17* 16* 19* _0 GLUCOSE 112* 123* 164* 164* 197* 165* 161*  BUN 54* 51* 60* 50* 66* 47* 65*  CREATININE 5.07* 5.06* 5.76* 4.93* 5.90* 4.59* 6.04*  CALCIUM 8.3* 8.0* 8.3* 7.9* 7.8* 7.8* 8.0*  PHOS  --  3.5 3.2 3.1 3.7 3.8 5.2*   CBC Recent Labs  Lab 11/16/20 0500 11/17/20 0310 11/18/20 0302 11/19/20 0327  WBC 13.0* 11.3* 11.9* 13.2*  HGB 8.4* 7.7* 7.4* 7.3*  HCT 23.4* 21.5* 22.1* 23.3*  MCV 73.1* 74.9* 77.3* 83.8  PLT 107* 128* 154 216    Medications:    . amiodarone  200 mg Oral Daily  . apixaban  5 mg Oral BID  . calcium carbonate  1 tablet Oral TID  . Chlorhexidine Gluconate Cloth  6 each Topical Q0600  . cholecalciferol  1,000 Units Oral Daily  . darbepoetin (ARANESP) injection - NON-DIALYSIS  40 mcg Subcutaneous Q Sat-1800  . feeding supplement  237 mL Oral TID BM  . fentaNYL      . heparin sodium (porcine)      . iron polysaccharides  150 mg Oral Daily  . lidocaine  1 patch Transdermal Q24H  . lidocaine-EPINEPHrine      . midazolam      . multivitamin with minerals  1 tablet Oral BID  . rosuvastatin  10 mg Oral Daily  . sodium chloride flush  10-40 mL Intracatheter Q12H  . sodium chloride flush  5 mL Intracatheter Q8H  . zinc sulfate  220 mg Oral Daily   Elmarie Shiley, MD 11/19/2020, 11:10 AM

## 2020-11-19 NOTE — Progress Notes (Signed)
PROGRESS NOTE    Tom Scott  ZOX:[id number] DOB: 15-Jun-1956 DOA: 11/08/2020 PCP: Patient, No Pcp Per (Inactive)   Brief Narrative: Tom Scott is a 65 y.o. male with a history of atrial fibrillation, hypertension, diabetes mellitus, hyperlipidemia.  Patient initially presented to [facility] in the hospital secondary to chest pain and epigastric pain with concern for choledocholithiasis.  He was transferred to [facility] for ERCP and due to septic shock requiring vasopressor support.  He was admitted to the ICU.  General surgery and gastroenterology were consulted for management of his acute calculus cholecystitis.  IR consulted for percutaneous cholecystostomy and patient managed with empiric antibiotics.  While admitted, patient developed ATN requiring initiation of hemodialysis.  Also while admitted, patient was found to have evidence of ESBL E. coli bacteremia from outside hospital report.  Patient's antibiotics have been transitioned to meropenem to treat his bacteremia and cholecystitis.   Assessment & Plan:   Principal Problem:   Septic shock ([place]) Active Problems:   Pressure injury of skin   Protein-calorie malnutrition, severe   Encounter for central line placement   AKI (acute kidney injury) ([place])   Septic shock Present on admission.  Secondary to cholecystitis and bacteremia. Patient required ICU admission with vasopressor support. Weaned off vasopressors and currently stable.  Acute calculus cholecystitis General surgery and IR consulted. IR placed percutaneous cholecystostomy on 5/20. LFTs with initial downtrend. AST/ALT with recurrent elevation, now trending down again. Patient with tenderness on exam, but diffuse.  ESBL E. Coli bacteremia Report received from OSH per PCCM that reports ESBL bacteremia and patient started on meropenem IV. Fever yesterday -Continue Meropenem -ID consult -Repeat blood cultures for temperature of 101.1 F on 8/11  Acute  metabolic encephalopathy Secondary to uremia. Resolved.  AKI Secondary to ATN secondary to septic shock. Patient started on hemodialysis. Poor urine output. -Nephrology recommendations: TTS schedule for HD  Atrial fibrillation with RVR Patient started on amiodarone for control. Currently rate controlled. On Eliquis for stroke prevention -Continue amiodarone and Eliquis  Asymptomatic anemia Patient with unknown baseline. Hemoglobin of 11 on admission with slow downtrend. No obvious source of bleeding. -CBC in AM  Anasarca Secondary to fluid resuscitation with IV fluids in setting of kidney failure in addition to albuminemia. Current I/O of +17.4L  Primary hypertension Patient is on amlodipine, losartan and metoprolol as an outpatient. Antihypertensives held secondary to hypotension and septic shock. Blood pressure now better controlled and stable. -If worsening control, can restart regimen  Diabetes mellitus, type 2 Patient is on metformin as an outpatient. -SSI  Hyperlipidemia -Continue Crestor  Severe malnutrition History of gastric bypass. Worse secondary to critical illness. NG tube inserted on 5/25 and patient started on tube feeds on 5/25. NG tube removed by patient on 5/28. -Oral intake -Dietitian recommendations: calorie count  Pressure injury Medial coccyx, not POA   DVT prophylaxis: Eliquis Code Status:   Code Status: Full Code Family Communication: Wife at bedside Disposition Plan: Discharge likely in several days pending management of AKI vs need for outpatient HD, therapy recommendations   Consultants:   PCCM  Gastroenterology  General surgery  Interventional radiology  Nephrology  Procedures:     Antimicrobials:  Flagyl  Cefepime  Meropenem    Subjective: No concerns today. Went for [facility] placement. Ate a little bit for lunch yesterday but not much for dinner.  Objective: Vitals:   11/19/20 0835 11/19/20 0840 11/19/20 0845 11/19/20  0922  BP: 110/65 123/71 115/65 115/66  Pulse: (!) 106 Marland Kitchen)  110 (!) 113 (!) 106  Resp: 16 (!) 25 (!) 27   Temp:    99.5 F (37.5 C)  TempSrc:    Oral  SpO2: 100% 93% 100% 96%  Weight:      Height:        Intake/Output Summary (Last 24 hours) at 11/19/2020 1242 Last data filed at 11/19/2020 1156 Gross per 24 hour  Intake 849 ml  Output 250 ml  Net 599 ml   Filed Weights   11/17/20 1730 11/18/20 0557 11/19/20 0500  Weight: 118.5 kg 120.2 kg 119 kg    Examination:  General exam: Appears calm and comfortable Respiratory system: Clear to auscultation. Respiratory effort normal. Cardiovascular system: S1 & S2 heard, RRR. No murmurs, rubs, gallops or clicks. Gastrointestinal system: Abdomen is distended, soft and mildly tender. No organomegaly or masses felt. Normal bowel sounds heard. Central nervous system: Alert and oriented. No focal neurological deficits. Musculoskeletal: 2+ BLE and abdominal edema. No calf tenderness Skin: No cyanosis. No rashes Psychiatry: Judgement and insight appear normal. Mood & affect appropriate.     Data Reviewed: I have personally reviewed following labs and imaging studies  CBC Lab Results  Component Value Date   WBC 13.2 (H) 11/19/2020   RBC 2.78 (L) 11/19/2020   HGB 7.3 (L) 11/19/2020   HCT 23.3 (L) 11/19/2020   MCV 83.8 11/19/2020   MCH 26.3 11/19/2020   PLT 216 11/19/2020   MCHC 31.3 11/19/2020   RDW 21.7 (H) 74/05/8785     Last metabolic panel Lab Results  Component Value Date   NA 132 (L) 11/19/2020   K 3.2 (L) 11/19/2020   CL 98 11/19/2020   CO2 22 11/19/2020   BUN 65 (H) 11/19/2020   CREATININE 6.04 (H) 11/19/2020   GLUCOSE 161 (H) 11/19/2020   GFRNONAA 10 (L) 11/19/2020   CALCIUM 8.0 (L) 11/19/2020   PHOS 5.2 (H) 11/19/2020   PROT 5.9 (L) 11/19/2020   ALBUMIN 1.7 (L) 11/19/2020   ALBUMIN 1.7 (L) 11/19/2020   BILITOT 1.3 (H) 11/19/2020   ALKPHOS 159 (H) 11/19/2020   AST 60 (H) 11/19/2020   ALT 48 (H) 11/19/2020    ANIONGAP 12 11/19/2020    CBG (last 3)  Recent Labs    11/19/20 0005 11/19/20 0944 11/19/20 1154  GLUCAP 165* 178* 187*     GFR: Estimated Creatinine Clearance: 15.3 mL/min (A) (by C-G formula based on SCr of 6.04 mg/dL (H)).  Coagulation Profile: Recent Labs  Lab 11/19/20 0327  INR 1.8*    Recent Results (from the past 240 hour(s))  Culture, blood (routine x 2)     Status: None (Preliminary result)   Collection Time: 11/18/20  4:57 PM   Specimen: BLOOD RIGHT HAND  Result Value Ref Range Status   Specimen Description BLOOD RIGHT HAND  Final   Special Requests   Final    BOTTLES DRAWN AEROBIC AND ANAEROBIC Blood Culture adequate volume   Culture   Final    NO GROWTH < 12 HOURS Performed at [facility], [place] [address], [place], [place] [address]    Report Status PENDING  Incomplete  Culture, blood (routine x 2)     Status: None (Preliminary result)   Collection Time: 11/18/20  4:59 PM   Specimen: BLOOD LEFT HAND  Result Value Ref Range Status   Specimen Description BLOOD LEFT HAND  Final   Special Requests   Final    BOTTLES DRAWN AEROBIC AND ANAEROBIC Blood Culture adequate volume  Culture   Final    NO GROWTH < 12 HOURS Performed at [facility], [place] [address], [place], [place] [address]    Report Status PENDING  Incomplete        Radiology Studies: IR Fluoro Guide CV Line Right  Result Date: 11/19/2020 INDICATION: 65 year old male with history of acute kidney injury requiring long-term hemodialysis. EXAM: TUNNELED CENTRAL VENOUS HEMODIALYSIS CATHETER PLACEMENT WITH ULTRASOUND AND FLUOROSCOPIC GUIDANCE MEDICATIONS: Vancomycin 1 gm IV . The antibiotic was given in an appropriate time interval prior to skin puncture. ANESTHESIA/SEDATION: Moderate (conscious) sedation was employed during this procedure. A total of Versed 1 mg and Fentanyl 100 mcg was administered intravenously. Moderate Sedation Time: 13 minutes. The patient's level of consciousness  and vital signs were monitored continuously by radiology nursing throughout the procedure under my direct supervision. FLUOROSCOPY TIME:  0 minutes 12 seconds (6 mGy). COMPLICATIONS: None immediate. PROCEDURE: Informed written consent was obtained from the patient after a discussion of the risks, benefits, and alternatives to treatment. Questions regarding the procedure were encouraged and answered. The right neck and chest were prepped with chlorhexidine in a sterile fashion, and a sterile drape was applied covering the operative field. Maximum barrier sterile technique with sterile gowns and gloves were used for the procedure. A timeout was performed prior to the initiation of the procedure. After creating a small venotomy incision, a 21 gauge micropuncture kit was utilized to access the internal jugular vein. Real-time ultrasound guidance was utilized for vascular access including the acquisition of a permanent ultrasound image documenting patency of the accessed vessel. A Rosen wire was advanced to the level of the IVC and the micropuncture sheath was exchanged for an 8 Fr dilator. A 14.5 French tunneled hemodialysis catheter measuring 23 cm from tip to cuff was tunneled in a retrograde fashion from the anterior chest wall to the venotomy incision. Serial dilation was then performed an a peel-away sheath was placed. The catheter was then placed through the peel-away sheath with the catheter tip ultimately positioned within the right atrium. Final catheter positioning was confirmed and documented with a spot radiographic image. The catheter aspirates and flushes normally. The catheter was flushed with appropriate volume heparin dwells. The catheter exit site was secured with a 0-Silk retention suture. The venotomy incision was closed with Dermabond. Sterile dressings were applied. The patient tolerated the procedure well without immediate post procedural complication. IMPRESSION: Successful placement of 23 cm tip  to cuff tunneled hemodialysis catheter via the right internal jugular vein with catheter tip terminating within the right atrium. The catheter is ready for immediate use. Ruthann Cancer, MD [facility] Radiology Electronically Signed   By: Ruthann Cancer MD   On: 11/19/2020 11:06   IR US Guide Vasc Access Right  Result Date: 11/19/2020 INDICATION: 65 year old male with history of acute kidney injury requiring long-term hemodialysis. EXAM: TUNNELED CENTRAL VENOUS HEMODIALYSIS CATHETER PLACEMENT WITH ULTRASOUND AND FLUOROSCOPIC GUIDANCE MEDICATIONS: Vancomycin 1 gm IV . The antibiotic was given in an appropriate time interval prior to skin puncture. ANESTHESIA/SEDATION: Moderate (conscious) sedation was employed during this procedure. A total of Versed 1 mg and Fentanyl 100 mcg was administered intravenously. Moderate Sedation Time: 13 minutes. The patient's level of consciousness and vital signs were monitored continuously by radiology nursing throughout the procedure under my direct supervision. FLUOROSCOPY TIME:  0 minutes 12 seconds (6 mGy). COMPLICATIONS: None immediate. PROCEDURE: Informed written consent was obtained from the patient after a discussion of the  risks, benefits, and alternatives to treatment. Questions regarding the procedure were encouraged and answered. The right neck and chest were prepped with chlorhexidine in a sterile fashion, and a sterile drape was applied covering the operative field. Maximum barrier sterile technique with sterile gowns and gloves were used for the procedure. A timeout was performed prior to the initiation of the procedure. After creating a small venotomy incision, a 21 gauge micropuncture kit was utilized to access the internal jugular vein. Real-time ultrasound guidance was utilized for vascular access including the acquisition of a permanent ultrasound image documenting patency of the accessed vessel. A Rosen wire was  advanced to the level of the IVC and the micropuncture sheath was exchanged for an 8 Fr dilator. A 14.5 French tunneled hemodialysis catheter measuring 23 cm from tip to cuff was tunneled in a retrograde fashion from the anterior chest wall to the venotomy incision. Serial dilation was then performed an a peel-away sheath was placed. The catheter was then placed through the peel-away sheath with the catheter tip ultimately positioned within the right atrium. Final catheter positioning was confirmed and documented with a spot radiographic image. The catheter aspirates and flushes normally. The catheter was flushed with appropriate volume heparin dwells. The catheter exit site was secured with a 0-Silk retention suture. The venotomy incision was closed with Dermabond. Sterile dressings were applied. The patient tolerated the procedure well without immediate post procedural complication. IMPRESSION: Successful placement of 23 cm tip to cuff tunneled hemodialysis catheter via the right internal jugular vein with catheter tip terminating within the right atrium. The catheter is ready for immediate use. Ruthann Cancer, MD [facility] Radiology Electronically Signed   By: Ruthann Cancer MD   On: 11/19/2020 11:06        Scheduled Meds: . amiodarone  200 mg Oral Daily  . apixaban  5 mg Oral BID  . calcium carbonate  1 tablet Oral TID  . Chlorhexidine Gluconate Cloth  6 each Topical Q0600  . cholecalciferol  1,000 Units Oral Daily  . darbepoetin (ARANESP) injection - NON-DIALYSIS  40 mcg Subcutaneous Q Sat-1800  . feeding supplement  237 mL Oral TID BM  . fentaNYL      . heparin sodium (porcine)      . iron polysaccharides  150 mg Oral Daily  . lidocaine  1 patch Transdermal Q24H  . lidocaine-EPINEPHrine      . midazolam      . multivitamin with minerals  1 tablet Oral BID  . rosuvastatin  10 mg Oral Daily  . sodium chloride flush  10-40 mL Intracatheter  Q12H  . sodium chloride flush  5 mL Intracatheter Q8H  . zinc sulfate  220 mg Oral Daily   Continuous Infusions: . sodium chloride       LOS: 11 days    Cordelia Poche, MD [facility] 11/19/2020, 12:42 PM  If 7PM-7AM, please contact night-coverage [email or web address]

## 2020-11-19 NOTE — Progress Notes (Signed)
OT Cancellation Note  Patient Details Name: Tom Scott MRN: [id number] DOB: 1956/04/17   Cancelled Treatment:    Reason Eval/Treat Not Completed: Patient at procedure or test/ unavailable- pt off unit for procedure. Will follow and see as able.   Jolaine Artist, OT Acute Rehabilitation Services Pager [phone] Office [phone]   Delight Stare 11/19/2020, 8:16 AM

## 2020-11-20 DIAGNOSIS — R7989 Other specified abnormal findings of blood chemistry: Secondary | ICD-10-CM

## 2020-11-20 DIAGNOSIS — R1011 Right upper quadrant pain: Secondary | ICD-10-CM

## 2020-11-20 DIAGNOSIS — A419 Sepsis, unspecified organism: Secondary | ICD-10-CM | POA: Diagnosis not present

## 2020-11-20 DIAGNOSIS — K8309 Other cholangitis: Secondary | ICD-10-CM

## 2020-11-20 LAB — HEPATIC FUNCTION PANEL
ALT: 39 U/L (ref 0–44)
AST: 46 U/L — ABNORMAL HIGH (ref 15–41)
Albumin: 1.7 g/dL — ABNORMAL LOW (ref 3.5–5.0)
Alkaline Phosphatase: 144 U/L — ABNORMAL HIGH (ref 38–126)
Bilirubin, Direct: 0.4 mg/dL — ABNORMAL HIGH (ref 0.0–0.2)
Indirect Bilirubin: 0.8 mg/dL (ref 0.3–0.9)
Total Bilirubin: 1.2 mg/dL (ref 0.3–1.2)
Total Protein: 5.4 g/dL — ABNORMAL LOW (ref 6.5–8.1)

## 2020-11-20 LAB — CBC
HCT: 21.6 % — ABNORMAL LOW (ref 39.0–52.0)
Hemoglobin: 7 g/dL — ABNORMAL LOW (ref 13.0–17.0)
MCH: 26.5 pg (ref 26.0–34.0)
MCHC: 32.4 g/dL (ref 30.0–36.0)
MCV: 81.8 fL (ref 80.0–100.0)
Platelets: 266 10*3/uL (ref 150–400)
RBC: 2.64 MIL/uL — ABNORMAL LOW (ref 4.22–5.81)
RDW: 21.8 % — ABNORMAL HIGH (ref 11.5–15.5)
WBC: 13.8 10*3/uL — ABNORMAL HIGH (ref 4.0–10.5)
nRBC: 0 % (ref 0.0–0.2)

## 2020-11-20 LAB — GLUCOSE, CAPILLARY
Glucose-Capillary: 154 mg/dL — ABNORMAL HIGH (ref 70–99)
Glucose-Capillary: 155 mg/dL — ABNORMAL HIGH (ref 70–99)
Glucose-Capillary: 113 mg/dL — ABNORMAL HIGH (ref 70–99)
Glucose-Capillary: 148 mg/dL — ABNORMAL HIGH (ref 70–99)
Glucose-Capillary: 173 mg/dL — ABNORMAL HIGH (ref 70–99)

## 2020-11-20 LAB — BASIC METABOLIC PANEL
Anion gap: 12 (ref 5–15)
BUN: 81 mg/dL — ABNORMAL HIGH (ref 8–23)
CO2: 19 mmol/L — ABNORMAL LOW (ref 22–32)
Calcium: 7.8 mg/dL — ABNORMAL LOW (ref 8.9–10.3)
Chloride: 96 mmol/L — ABNORMAL LOW (ref 98–111)
Creatinine, Ser: 7.34 mg/dL — ABNORMAL HIGH (ref 0.61–1.24)
GFR, Estimated: 8 mL/min — ABNORMAL LOW (ref >60)
Glucose, Bld: 178 mg/dL — ABNORMAL HIGH (ref 70–99)
Potassium: 3.1 mmol/L — ABNORMAL LOW (ref 3.5–5.1)
Sodium: 127 mmol/L — ABNORMAL LOW (ref 135–145)

## 2020-11-20 LAB — VITAMIN B1: Vitamin B1 (Thiamine): 116.7 nmol/L (ref 66.5–200.0)

## 2020-11-20 MED ORDER — ACETAMINOPHEN 500 MG PO TABS
1000.0000 mg | ORAL_TABLET | Freq: Once | ORAL | Status: AC
  Administered 2020-11-20: 1000 mg via ORAL
  Filled 2020-11-20: qty 2

## 2020-11-20 MED ORDER — HEPARIN SODIUM (PORCINE) 1000 UNIT/ML DIALYSIS: 40.0000 [IU]/kg | INTRAMUSCULAR | Status: DC | PRN

## 2020-11-20 MED ORDER — SODIUM CHLORIDE 0.9 % IV BOLUS: 500.0000 mL | Freq: Once | INTRAVENOUS | Status: DC

## 2020-11-20 MED ORDER — SODIUM CHLORIDE 0.9 % IV SOLN
500.0000 mg | INTRAVENOUS | Status: AC
  Administered 2020-11-20 – 2020-11-23 (×4): 500 mg via INTRAVENOUS
  Filled 2020-11-20 (×6): qty 0.5

## 2020-11-20 MED ORDER — HEPARIN SODIUM (PORCINE) 1000 UNIT/ML IJ SOLN
INTRAMUSCULAR | Status: AC
  Administered 2020-11-20: 1000 [IU]
  Filled 2020-11-20: qty 4

## 2020-11-20 MED ORDER — SODIUM CHLORIDE 0.9 % IV BOLUS
250.0000 mL | Freq: Once | INTRAVENOUS | Status: AC
  Administered 2020-11-20: 250 mL via INTRAVENOUS

## 2020-11-20 NOTE — Procedures (Signed)
Patient seen on Hemodialysis. BP (!) 107/59   Pulse 98   Temp 98.1 F (36.7 C) (Oral)   Resp 18   Ht '5\' 10"'$  (1.778 m)   Wt 121 kg   SpO2 92%   BMI 38.28 kg/m   QB 400, UF goal 3L Tolerating treatment without complaints at this time except for feeling fatigued.  No urine output so far and no indications of renal recovery.  Elmarie Shiley MD [facility]. Office # [phone] Pager # [phone] 10:29 AM

## 2020-11-20 NOTE — NC FL2 (Signed)
[place] MEDICAID FL2 LEVEL OF CARE SCREENING TOOL     IDENTIFICATION  Patient Name: Tom Scott Birthdate: 08-27-1955 Sex: male Admission Date (Current Location): 11/08/2020  [facility] and [place] Number:  Herbalist and Address:  [facility] [place] [address], [place], [place] [address]      Provider Number: [id number]  Attending Physician Name and Address:  Mariel Aloe, MD  Relative Name and Phone Number:       Current Level of Care: Hospital Recommended Level of Care: [place] Prior Approval Number:    Date Approved/Denied:   PASRR Number: [id number]  Discharge Plan: SNF    Current Diagnoses: Patient Active Problem List   Diagnosis Date Noted  . Protein-calorie malnutrition, severe 11/14/2020  . Encounter for central line placement   . AKI (acute kidney injury) ([place])   . Pressure injury of skin 11/13/2020  . Septic shock ([place]) 11/08/2020    Orientation RESPIRATION BLADDER Height & Weight     Self,Time,Situation,Place  Normal Incontinent Weight: 260 lb 2.3 oz (118 kg) Height:  '5\' 10"'$  (177.8 cm)  BEHAVIORAL SYMPTOMS/MOOD NEUROLOGICAL BOWEL NUTRITION STATUS      Incontinent Diet (See DC summary)  AMBULATORY STATUS COMMUNICATION OF NEEDS Skin   Extensive Assist Verbally PU Stage and Appropriate Care (cyccox)   PU Stage 2 Dressing: BID                   Personal Care Assistance Level of Assistance  Bathing,Feeding,Dressing Bathing Assistance: Maximum assistance Feeding assistance: Limited assistance Dressing Assistance: Maximum assistance     Functional Limitations Info  Sight,Hearing,Speech Sight Info: Adequate Hearing Info: Adequate Speech Info: Adequate    SPECIAL CARE FACTORS FREQUENCY  PT (By licensed PT),OT (By licensed OT)     PT Frequency: 5x a week OT Frequency: 5x a week            Contractures Contractures Info: Not present    Additional Factors Info  Code  Status,Allergies Code Status Info: full Allergies Info: Penicillamine   Penicillins           Current Medications (11/20/2020):  This is the current hospital active medication list Current Facility-Administered Medications  Medication Dose Route Frequency Provider Last Rate Last Admin  . (feeding supplement) PROSource Plus liquid 30 mL  30 mL Oral BID BM Mariel Aloe, MD   30 mL at 11/20/20 1527  . 0.9 %  sodium chloride infusion  250 mL Intravenous Continuous Collene Gobble, MD      . acetaminophen (TYLENOL) tablet 650 mg  650 mg Oral Q4H PRN Mansy, Jan A, MD   650 mg at 11/19/20 2215  . amiodarone (PACERONE) tablet 200 mg  200 mg Oral Daily Steenwyk, Yujing Z, RPH   200 mg at 11/20/20 0901  . apixaban (ELIQUIS) tablet 5 mg  5 mg Oral BID Claudia Desanctis, MD   5 mg at 11/20/20 1525  . ascorbic acid (VITAMIN C) tablet 500 mg  500 mg Oral BID Mariel Aloe, MD   500 mg at 11/20/20 0900  . calcium carbonate (TUMS - dosed in mg elemental calcium) chewable tablet 200 mg of elemental calcium  1 tablet Oral TID [place]enwyk, Yujing Z, RPH   200 mg of elemental calcium at 11/20/20 1525  . Chlorhexidine Gluconate Cloth 2 % PADS 6 each  6 each Topical Q0600 Claudia Desanctis, MD   6 each at 11/20/20 [phone]  . cholecalciferol (VITAMIN  D3) tablet 1,000 Units  1,000 Units Oral Daily Mirian Capuchin, RPH   1,000 Units at 11/20/20 0901  . Darbepoetin Alfa (ARANESP) injection 40 mcg  40 mcg Subcutaneous Q Sat-1800 Claudia Desanctis, MD   40 mcg at 11/17/20 1642  . docusate (COLACE) 50 MG/5ML liquid 100 mg  100 mg Oral BID PRN Mirian Capuchin, RPH      . heparin injection 1,000-6,000 Units  1,000-6,000 Units Intracatheter PRN Collene Gobble, MD   2,000 Units at 11/15/20 [phone]  . insulin aspart (novoLOG) injection 0-6 Units  0-6 Units Subcutaneous TID WC Mariel Aloe, MD   1 Units at 11/20/20 [phone]  . iron polysaccharides (NIFEREX) capsule 150 mg  150 mg Oral Daily Claudia Desanctis, MD   150 mg at 11/20/20 0901   . lactose free nutrition (BOOST PLUS) liquid 237 mL  237 mL Oral TID WC Mariel Aloe, MD   237 mL at 11/20/20 0820  . lidocaine (LIDODERM) 5 % 1 patch  1 patch Transdermal Q24H Collene Gobble, MD   1 patch at 11/20/20 1527  . meropenem (MERREM) 500 mg in sodium chloride 0.9 % 100 mL IVPB  500 mg Intravenous Q24H Mariel Aloe, MD      . multivitamin with minerals tablet 1 tablet  1 tablet Oral BID Mirian Capuchin, RPH   1 tablet at 11/20/20 0901  . ondansetron (ZOFRAN) injection 4 mg  4 mg Intravenous Q6H PRN Collene Gobble, MD   4 mg at 11/09/20 0840  . oxyCODONE (Oxy IR/ROXICODONE) immediate release tablet 5-10 mg  5-10 mg Oral Q6H PRN Mariel Aloe, MD   5 mg at 11/19/20 1653  . polyethylene glycol (MIRALAX / GLYCOLAX) packet 17 g  17 g Per Tube Daily PRN Collene Gobble, MD      . rosuvastatin (CRESTOR) tablet 10 mg  10 mg Oral Daily Steenwyk, Yujing Z, RPH   10 mg at 11/20/20 0901  . sodium chloride flush (NS) 0.9 % injection 10-40 mL  10-40 mL Intracatheter Q12H Collene Gobble, MD   10 mL at 11/19/20 1000  . sodium chloride flush (NS) 0.9 % injection 10-40 mL  10-40 mL Intracatheter PRN Collene Gobble, MD      . sodium chloride flush (NS) 0.9 % injection 5 mL  5 mL Intracatheter Q8H Collene Gobble, MD   5 mL at 11/20/20 0603  . zinc sulfate capsule 220 mg  220 mg Oral Daily Steenwyk, Yujing Z, RPH   220 mg at 11/20/20 0900     Discharge Medications: Please see discharge summary for a list of discharge medications.  Relevant Imaging Results:  Relevant Lab Results:   Additional Information SSN: [id number]  Emeterio Reeve, [place]

## 2020-11-20 NOTE — Progress Notes (Addendum)
Progress Note  Chief Complaint:    Elevated liver tests     ASSESSMENT / PLAN:    Brief History: 65 yo male with AFIB, HTN, DM, HLD. Admitted several days ago with Afib in RVR, septic shock, AKI,  Elevated liver tests and concern for cholecystitis and choledocholithiasis.   # Elevated liver tests. We didn't feel patient had choledocholithiasis but he was never well enough to obtain a good quality MRCP. Marland KitchenHe underwent a perc chole drain by IR on 5/20. Gallbladder aspirate positive for E.Coli. LFTs improved with perc drain and antibiotics.  On 5/29 we were asked to see him again for rising liver tests which were still felt to be secondary to cholecystitis, +/- Amiodarone. However his LFTs have been continuously improved over the last couple of days (still  on Amiodarone).   --LFTs have nearly normalized.   --GI will sign off  # RUE pain and swelling. Patient's wife says it started after HD cath placed yesterday.  --I communicated patient's findings with TRH        SUBJECTIVE:   RUE pain. No abdomina pain. No N/V.     OBJECTIVE:    Scheduled inpatient medications:  . (feeding supplement) PROSource Plus  30 mL Oral BID BM  . amiodarone  200 mg Oral Daily  . apixaban  5 mg Oral BID  . vitamin C  500 mg Oral BID  . calcium carbonate  1 tablet Oral TID  . Chlorhexidine Gluconate Cloth  6 each Topical Q0600  . cholecalciferol  1,000 Units Oral Daily  . darbepoetin (ARANESP) injection - NON-DIALYSIS  40 mcg Subcutaneous Q Sat-1800  . insulin aspart  0-6 Units Subcutaneous TID WC  . iron polysaccharides  150 mg Oral Daily  . lactose free nutrition  237 mL Oral TID WC  . lidocaine  1 patch Transdermal Q24H  . multivitamin with minerals  1 tablet Oral BID  . rosuvastatin  10 mg Oral Daily  . sodium chloride flush  10-40 mL Intracatheter Q12H  . sodium chloride flush  5 mL Intracatheter Q8H  . zinc sulfate  220 mg Oral Daily   Continuous inpatient infusions:  . sodium chloride     . meropenem (MERREM) IV     PRN inpatient medications: acetaminophen, docusate, heparin, ondansetron (ZOFRAN) IV, oxyCODONE, polyethylene glycol, sodium chloride flush  Vital signs in last 24 hours: Temp:  [97.6 F (36.4 C)-99.6 F (37.6 C)] 99.3 F (37.4 C) (05/31 1413) Pulse Rate:  [91-110] 107 (05/31 1413) Resp:  [18-30] 18 (05/31 1413) BP: (87-130)/(48-68) 111/62 (05/31 1413) SpO2:  [92 %-96 %] 94 % (05/31 1413) Weight:  [201 kg-121 kg] 118 kg (05/31 1335) Last BM Date: 11/19/20  Intake/Output Summary (Last 24 hours) at 11/20/2020 1612 Last data filed at 11/20/2020 1335 Gross per 24 hour  Intake 787 ml  Output 3300 ml  Net -2513 ml     Physical Exam:  . General: Alert male in NAD . Heart:  Regular rate  . Pulmonary: Normal respiratory effort . Abdomen: Soft, nondistended, nontender. Approx 200 cc bilious output in perc drain collection bag. Hyperactive bowel sounds, abdomen is tympanitic ( ? Developing ileus) . Neurologic: Alert and oriented . Psych: Pleasant. Cooperative.   Filed Weights   11/19/20 0500 11/20/20 0920 11/20/20 1335  Weight: 119 kg 121 kg 118 kg    Intake/Output from previous day: 05/30 0701 - 05/31 0700 In: 982 [P.O.:315] Out: 300 [Drains:300] Intake/Output this shift: Total I/O In: 120 [  P.O.:120] Out: 3000 [Other:3000]    Lab Results: Recent Labs    11/18/20 0302 11/19/20 0327 11/20/20 0112  WBC 11.9* 13.2* 13.8*  HGB 7.4* 7.3* 7.0*  HCT 22.1* 23.3* 21.6*  PLT 154 216 266   BMET Recent Labs    11/18/20 0302 11/19/20 0327 11/20/20 0944  NA 133* 132* 127*  K 3.4* 3.2* 3.1*  CL 98 98 96*  CO2 25 22 19*  GLUCOSE 165* 161* 178*  BUN 47* 65* 81*  CREATININE 4.59* 6.04* 7.34*  CALCIUM 7.8* 8.0* 7.8*   LFT Recent Labs    11/20/20 0112  PROT 5.4*  ALBUMIN 1.7*  AST 46*  ALT 39  ALKPHOS 144*  BILITOT 1.2  BILIDIR 0.4*  IBILI 0.8   PT/INR Recent Labs    11/19/20 0327  LABPROT 20.8*  INR 1.8*   Hepatitis  Panel No results for input(s): HEPBSAG, HCVAB, HEPAIGM, HEPBIGM in the last 72 hours.  IR Fluoro Guide CV Line Right  Result Date: 11/19/2020 INDICATION: 65 year old male with history of acute kidney injury requiring long-term hemodialysis. EXAM: TUNNELED CENTRAL VENOUS HEMODIALYSIS CATHETER PLACEMENT WITH ULTRASOUND AND FLUOROSCOPIC GUIDANCE MEDICATIONS: Vancomycin 1 gm IV . The antibiotic was given in an appropriate time interval prior to skin puncture. ANESTHESIA/SEDATION: Moderate (conscious) sedation was employed during this procedure. A total of Versed 1 mg and Fentanyl 100 mcg was administered intravenously. Moderate Sedation Time: 13 minutes. The patient's level of consciousness and vital signs were monitored continuously by radiology nursing throughout the procedure under my direct supervision. FLUOROSCOPY TIME:  0 minutes 12 seconds (6 mGy). COMPLICATIONS: None immediate. PROCEDURE: Informed written consent was obtained from the patient after a discussion of the risks, benefits, and alternatives to treatment. Questions regarding the procedure were encouraged and answered. The right neck and chest were prepped with chlorhexidine in a sterile fashion, and a sterile drape was applied covering the operative field. Maximum barrier sterile technique with sterile gowns and gloves were used for the procedure. A timeout was performed prior to the initiation of the procedure. After creating a small venotomy incision, a 21 gauge micropuncture kit was utilized to access the internal jugular vein. Real-time ultrasound guidance was utilized for vascular access including the acquisition of a permanent ultrasound image documenting patency of the accessed vessel. A Rosen wire was advanced to the level of the IVC and the micropuncture sheath was exchanged for an 8 Fr dilator. A 14.5 French tunneled hemodialysis catheter measuring 23 cm from tip to cuff was tunneled in a retrograde fashion from the anterior chest wall  to the venotomy incision. Serial dilation was then performed an a peel-away sheath was placed. The catheter was then placed through the peel-away sheath with the catheter tip ultimately positioned within the right atrium. Final catheter positioning was confirmed and documented with a spot radiographic image. The catheter aspirates and flushes normally. The catheter was flushed with appropriate volume heparin dwells. The catheter exit site was secured with a 0-Silk retention suture. The venotomy incision was closed with Dermabond. Sterile dressings were applied. The patient tolerated the procedure well without immediate post procedural complication. IMPRESSION: Successful placement of 23 cm tip to cuff tunneled hemodialysis catheter via the right internal jugular vein with catheter tip terminating within the right atrium. The catheter is ready for immediate use. Ruthann Cancer, MD [facility] Radiology Electronically Signed   By: Ruthann Cancer MD   On: 11/19/2020 11:06   IR US Guide Vasc Access Right  Result Date: 11/19/2020 INDICATION: 65 year old male with history of acute kidney injury requiring long-term hemodialysis. EXAM: TUNNELED CENTRAL VENOUS HEMODIALYSIS CATHETER PLACEMENT WITH ULTRASOUND AND FLUOROSCOPIC GUIDANCE MEDICATIONS: Vancomycin 1 gm IV . The antibiotic was given in an appropriate time interval prior to skin puncture. ANESTHESIA/SEDATION: Moderate (conscious) sedation was employed during this procedure. A total of Versed 1 mg and Fentanyl 100 mcg was administered intravenously. Moderate Sedation Time: 13 minutes. The patient's level of consciousness and vital signs were monitored continuously by radiology nursing throughout the procedure under my direct supervision. FLUOROSCOPY TIME:  0 minutes 12 seconds (6 mGy). COMPLICATIONS: None immediate. PROCEDURE: Informed written consent was obtained from the patient after a discussion of the risks,  benefits, and alternatives to treatment. Questions regarding the procedure were encouraged and answered. The right neck and chest were prepped with chlorhexidine in a sterile fashion, and a sterile drape was applied covering the operative field. Maximum barrier sterile technique with sterile gowns and gloves were used for the procedure. A timeout was performed prior to the initiation of the procedure. After creating a small venotomy incision, a 21 gauge micropuncture kit was utilized to access the internal jugular vein. Real-time ultrasound guidance was utilized for vascular access including the acquisition of a permanent ultrasound image documenting patency of the accessed vessel. A Rosen wire was advanced to the level of the IVC and the micropuncture sheath was exchanged for an 8 Fr dilator. A 14.5 French tunneled hemodialysis catheter measuring 23 cm from tip to cuff was tunneled in a retrograde fashion from the anterior chest wall to the venotomy incision. Serial dilation was then performed an a peel-away sheath was placed. The catheter was then placed through the peel-away sheath with the catheter tip ultimately positioned within the right atrium. Final catheter positioning was confirmed and documented with a spot radiographic image. The catheter aspirates and flushes normally. The catheter was flushed with appropriate volume heparin dwells. The catheter exit site was secured with a 0-Silk retention suture. The venotomy incision was closed with Dermabond. Sterile dressings were applied. The patient tolerated the procedure well without immediate post procedural complication. IMPRESSION: Successful placement of 23 cm tip to cuff tunneled hemodialysis catheter via the right internal jugular vein with catheter tip terminating within the right atrium. The catheter is ready for immediate use. Ruthann Cancer, MD [facility] Radiology Electronically Signed   By: Ruthann Cancer MD   On: 11/19/2020 11:06     Principal Problem:   Septic shock ([place]) Active Problems:   Pressure injury of skin   Protein-calorie malnutrition, severe   Encounter for central line placement   AKI (acute kidney injury) ([place])     LOS: 12 days   Tye Savoy ,NP 11/20/2020, 4:12 PM   GI ATTENDING  Interval history,labs,prior X-rays reviewed. Agree with interval progress note as outlined. Liver tests continue to improve. They may fluctuate for some time but should normalize in time, as is typical with sepsis complicated by multiple systemic problems (afib, renal failure, multiple meds, etc.). Continue with active treatment of multiple concurrent problems. Moving forward, management of percutaneous drain/gallbladder per IR/GSU. Primary service, nephrology, ID to address respective medical problems. Nothing further to do, or add, from GI medicine perspective. Will sign off.  Docia Chuck. Geri [place]in[place]., M.D. [place] [facility] Division of Gastroenterology

## 2020-11-20 NOTE — Progress Notes (Signed)
PROGRESS NOTE    Theoden Mauch  MLY:[id number] DOB: [birth year] DOA: 11/08/2020 PCP: Patient, No Pcp Per (Inactive)   Brief Narrative: Hale Chalfin is a 65 y.o. male with a history of atrial fibrillation, hypertension, diabetes mellitus, hyperlipidemia.  Patient initially presented to [facility] in the hospital secondary to chest pain and epigastric pain with concern for choledocholithiasis.  He was transferred to [facility] for ERCP and due to septic shock requiring vasopressor support.  He was admitted to the ICU.  General surgery and gastroenterology were consulted for management of his acute calculus cholecystitis.  IR consulted for percutaneous cholecystostomy and patient managed with empiric antibiotics.  While admitted, patient developed ATN requiring initiation of hemodialysis.  Also while admitted, patient was found to have evidence of ESBL E. coli bacteremia from outside hospital report.  Patient's antibiotics have been transitioned to meropenem to treat his bacteremia and cholecystitis.   Assessment & Plan:   Principal Problem:   Septic shock ([place]) Active Problems:   Pressure injury of skin   Protein-calorie malnutrition, severe   Encounter for central line placement   AKI (acute kidney injury) ([place])   Septic shock Present on admission.  Secondary to cholecystitis and bacteremia. Patient required ICU admission with vasopressor support. Weaned off vasopressors and currently stable. Resolved.  Acute calculus cholecystitis General surgery and IR consulted. IR placed percutaneous cholecystostomy on 5/20. LFTs with initial downtrend. AST/ALT with recurrent elevation, now trending down again. Fluid culture significant for E. Coli, in addition to rare bacteroides fragilis.  ESBL E. Coli bacteremia Report received from OSH per PCCM that reports ESBL bacteremia and patient started on meropenem IV. No fever overnight. Repeat blood culture (5/29) with no growth to  date. -Continue Meropenem -ID recommendations: meropenem x14 total days -Repeat blood cultures for temperature of 101.1 F on 8/12  Acute metabolic encephalopathy Secondary to uremia. Resolved.  AKI Secondary to ATN secondary to septic shock. Patient started on hemodialysis. Poor urine output. -Nephrology recommendations: TTS schedule for HD  Atrial fibrillation with RVR Patient started on amiodarone for control. Currently rate controlled. On Eliquis for stroke prevention -Continue amiodarone and Eliquis  Asymptomatic anemia Patient with unknown baseline. Hemoglobin of 11 on admission with slow downtrend. No obvious source of bleeding. -CBC in AM -Transfuse for hemoglobin <7  Anasarca Secondary to fluid resuscitation with IV fluids in setting of kidney failure in addition to albuminemia. Current I/O of +17.4L  Primary hypertension Patient is on amlodipine, losartan and metoprolol as an outpatient. Antihypertensives held secondary to hypotension and septic shock. Blood pressure now better controlled and stable. -If worsening control, can restart regimen  Diabetes mellitus, type 2 Patient is on metformin as an outpatient. -SSI  Hyperlipidemia -Continue Crestor  Severe malnutrition History of gastric bypass. Worse secondary to critical illness. NG tube inserted on 5/25 and patient started on tube feeds on 5/25. NG tube removed by patient on 5/28. -Oral intake -Dietitian recommendations:  Trial Boost Plus chocolate TID- Each supplement provides 360kcal and 14g protein.    30 ml ProSource Plus BID, each supplement provides 100 kcals and 15 grams protein.   MVI with minerals BID per tube  500 mg calcium carbonate TID per tube  Supplement Vitamin C 500 mg BID   Pressure injury Medial coccyx, not POA   DVT prophylaxis: Eliquis Code Status:   Code Status: Full Code Family Communication: Wife at bedside Disposition Plan: Discharge likely in several days pending  management of AKI vs need for outpatient HD, therapy  recommendations   Consultants:   PCCM  Gastroenterology  General surgery  Interventional radiology  Nephrology  Procedures:     Antimicrobials:  Flagyl  Cefepime  Meropenem    Subjective: Having frequent bowel movements.  Objective: Vitals:   11/20/20 1230 11/20/20 1300 11/20/20 1335 11/20/20 1413  BP: 116/62 106/68 130/67 111/62  Pulse: (!) 103 (!) 110 (!) 103 (!) 107  Resp: 20 19  18   Temp:   97.6 F (36.4 C) 99.3 F (37.4 C)  TempSrc:   Oral Oral  SpO2:   94% 94%  Weight:   118 kg   Height:        Intake/Output Summary (Last 24 hours) at 11/20/2020 1447 Last data filed at 11/20/2020 1335 Gross per 24 hour  Intake 787 ml  Output 3300 ml  Net -2513 ml   Filed Weights   11/19/20 0500 11/20/20 0920 11/20/20 1335  Weight: 119 kg 121 kg 118 kg    Examination:  General exam: Appears calm and comfortable Respiratory system: Clear to auscultation. Respiratory effort normal. Cardiovascular system: S1 & S2 heard, RRR. No murmurs, rubs, gallops or clicks. Gastrointestinal system: Abdomen is nondistended, soft and mildly tender. No organomegaly or masses felt. Normal bowel sounds heard. Central nervous system: Alert and oriented. No focal neurological deficits. Musculoskeletal: 2+ pitting edema of BLE and abdomen. No calf tenderness Skin: No cyanosis. No rashes Psychiatry: Judgement and insight appear normal. Mood & affect appropriate.    Data Reviewed: I have personally reviewed following labs and imaging studies  CBC Lab Results  Component Value Date   WBC 13.8 (H) 11/20/2020   RBC 2.64 (L) 11/20/2020   HGB 7.0 (L) 11/20/2020   HCT 21.6 (L) 11/20/2020   MCV 81.8 11/20/2020   MCH 26.5 11/20/2020   PLT 266 11/20/2020   MCHC 32.4 11/20/2020   RDW 21.8 (H) 51/03/2110     Last metabolic panel Lab Results  Component Value Date   NA 127 (L) 11/20/2020   K 3.1 (L) 11/20/2020   CL 96 (L)  11/20/2020   CO2 19 (L) 11/20/2020   BUN 81 (H) 11/20/2020   CREATININE 7.34 (H) 11/20/2020   GLUCOSE 178 (H) 11/20/2020   GFRNONAA 8 (L) 11/20/2020   CALCIUM 7.8 (L) 11/20/2020   PHOS 5.2 (H) 11/19/2020   PROT 5.4 (L) 11/20/2020   ALBUMIN 1.7 (L) 11/20/2020   BILITOT 1.2 11/20/2020   ALKPHOS 144 (H) 11/20/2020   AST 46 (H) 11/20/2020   ALT 39 11/20/2020   ANIONGAP 12 11/20/2020    CBG (last 3)  Recent Labs    11/20/20 0636 11/20/20 0734 11/20/20 1410  GLUCAP 154* 155* 113*     GFR: Estimated Creatinine Clearance: 12.6 mL/min (A) (by C-G formula based on SCr of 7.34 mg/dL (H)).  Coagulation Profile: Recent Labs  Lab 11/19/20 0327  INR 1.8*    Recent Results (from the past 240 hour(s))  Culture, blood (routine x 2)     Status: None (Preliminary result)   Collection Time: 11/18/20  4:57 PM   Specimen: BLOOD RIGHT HAND  Result Value Ref Range Status   Specimen Description BLOOD RIGHT HAND  Final   Special Requests   Final    BOTTLES DRAWN AEROBIC AND ANAEROBIC Blood Culture adequate volume   Culture   Final    NO GROWTH 2 DAYS Performed at [facility], [place] [address], [place],  [address]    Report Status PENDING  Incomplete  Culture, blood (routine  x 2)     Status: None (Preliminary result)   Collection Time: 11/18/20  4:59 PM   Specimen: BLOOD LEFT HAND  Result Value Ref Range Status   Specimen Description BLOOD LEFT HAND  Final   Special Requests   Final    BOTTLES DRAWN AEROBIC AND ANAEROBIC Blood Culture adequate volume   Culture   Final    NO GROWTH 2 DAYS Performed at [facility], [address] [place], [place] [address]    Report Status PENDING  Incomplete        Radiology Studies: IR Fluoro Guide CV Line Right  Result Date: 11/19/2020 INDICATION: 65 year old male with history of acute kidney injury requiring long-term hemodialysis. EXAM: TUNNELED CENTRAL VENOUS HEMODIALYSIS CATHETER PLACEMENT WITH ULTRASOUND AND  FLUOROSCOPIC GUIDANCE MEDICATIONS: Vancomycin 1 gm IV . The antibiotic was given in an appropriate time interval prior to skin puncture. ANESTHESIA/SEDATION: Moderate (conscious) sedation was employed during this procedure. A total of Versed 1 mg and Fentanyl 100 mcg was administered intravenously. Moderate Sedation Time: 13 minutes. The patient's level of consciousness and vital signs were monitored continuously by radiology nursing throughout the procedure under my direct supervision. FLUOROSCOPY TIME:  0 minutes 12 seconds (6 mGy). COMPLICATIONS: None immediate. PROCEDURE: Informed written consent was obtained from the patient after a discussion of the risks, benefits, and alternatives to treatment. Questions regarding the procedure were encouraged and answered. The right neck and chest were prepped with chlorhexidine in a sterile fashion, and a sterile drape was applied covering the operative field. Maximum barrier sterile technique with sterile gowns and gloves were used for the procedure. A timeout was performed prior to the initiation of the procedure. After creating a small venotomy incision, a 21 gauge micropuncture kit was utilized to access the internal jugular vein. Real-time ultrasound guidance was utilized for vascular access including the acquisition of a permanent ultrasound image documenting patency of the accessed vessel. A Rosen wire was advanced to the level of the IVC and the micropuncture sheath was exchanged for an 8 Fr dilator. A 14.5 French tunneled hemodialysis catheter measuring 23 cm from tip to cuff was tunneled in a retrograde fashion from the anterior chest wall to the venotomy incision. Serial dilation was then performed an a peel-away sheath was placed. The catheter was then placed through the peel-away sheath with the catheter tip ultimately positioned within the right atrium. Final catheter positioning was confirmed and documented with a spot radiographic image. The catheter  aspirates and flushes normally. The catheter was flushed with appropriate volume heparin dwells. The catheter exit site was secured with a 0-Silk retention suture. The venotomy incision was closed with Dermabond. Sterile dressings were applied. The patient tolerated the procedure well without immediate post procedural complication. IMPRESSION: Successful placement of 23 cm tip to cuff tunneled hemodialysis catheter via the right internal jugular vein with catheter tip terminating within the right atrium. The catheter is ready for immediate use. Ruthann Cancer, MD [facility] Radiology Electronically Signed   By: Ruthann Cancer MD   On: 11/19/2020 11:06   IR US Guide Vasc Access Right  Result Date: 11/19/2020 INDICATION: 65 year old male with history of acute kidney injury requiring long-term hemodialysis. EXAM: TUNNELED CENTRAL VENOUS HEMODIALYSIS CATHETER PLACEMENT WITH ULTRASOUND AND FLUOROSCOPIC GUIDANCE MEDICATIONS: Vancomycin 1 gm IV . The antibiotic was given in an appropriate time interval prior to skin puncture. ANESTHESIA/SEDATION: Moderate (conscious) sedation was employed during this procedure. A total of Versed 1 mg  and Fentanyl 100 mcg was administered intravenously. Moderate Sedation Time: 13 minutes. The patient's level of consciousness and vital signs were monitored continuously by radiology nursing throughout the procedure under my direct supervision. FLUOROSCOPY TIME:  0 minutes 12 seconds (6 mGy). COMPLICATIONS: None immediate. PROCEDURE: Informed written consent was obtained from the patient after a discussion of the risks, benefits, and alternatives to treatment. Questions regarding the procedure were encouraged and answered. The right neck and chest were prepped with chlorhexidine in a sterile fashion, and a sterile drape was applied covering the operative field. Maximum barrier sterile technique with sterile gowns and gloves were used for the  procedure. A timeout was performed prior to the initiation of the procedure. After creating a small venotomy incision, a 21 gauge micropuncture kit was utilized to access the internal jugular vein. Real-time ultrasound guidance was utilized for vascular access including the acquisition of a permanent ultrasound image documenting patency of the accessed vessel. A Rosen wire was advanced to the level of the IVC and the micropuncture sheath was exchanged for an 8 Fr dilator. A 14.5 French tunneled hemodialysis catheter measuring 23 cm from tip to cuff was tunneled in a retrograde fashion from the anterior chest wall to the venotomy incision. Serial dilation was then performed an a peel-away sheath was placed. The catheter was then placed through the peel-away sheath with the catheter tip ultimately positioned within the right atrium. Final catheter positioning was confirmed and documented with a spot radiographic image. The catheter aspirates and flushes normally. The catheter was flushed with appropriate volume heparin dwells. The catheter exit site was secured with a 0-Silk retention suture. The venotomy incision was closed with Dermabond. Sterile dressings were applied. The patient tolerated the procedure well without immediate post procedural complication. IMPRESSION: Successful placement of 23 cm tip to cuff tunneled hemodialysis catheter via the right internal jugular vein with catheter tip terminating within the right atrium. The catheter is ready for immediate use. Ruthann Cancer, MD [facility] Radiology Electronically Signed   By: Ruthann Cancer MD   On: 11/19/2020 11:06        Scheduled Meds: . (feeding supplement) PROSource Plus  30 mL Oral BID BM  . amiodarone  200 mg Oral Daily  . apixaban  5 mg Oral BID  . vitamin C  500 mg Oral BID  . calcium carbonate  1 tablet Oral TID  . Chlorhexidine Gluconate Cloth  6 each Topical Q0600  . cholecalciferol   1,000 Units Oral Daily  . darbepoetin (ARANESP) injection - NON-DIALYSIS  40 mcg Subcutaneous Q Sat-1800  . insulin aspart  0-6 Units Subcutaneous TID WC  . iron polysaccharides  150 mg Oral Daily  . lactose free nutrition  237 mL Oral TID WC  . lidocaine  1 patch Transdermal Q24H  . multivitamin with minerals  1 tablet Oral BID  . rosuvastatin  10 mg Oral Daily  . sodium chloride flush  10-40 mL Intracatheter Q12H  . sodium chloride flush  5 mL Intracatheter Q8H  . zinc sulfate  220 mg Oral Daily   Continuous Infusions: . sodium chloride    . meropenem (MERREM) IV       LOS: 12 days    Cordelia Poche, MD [facility] 11/20/2020, 2:47 PM  If 7PM-7AM, please contact night-coverage [email or web address]

## 2020-11-20 NOTE — Progress Notes (Signed)
PT Cancellation Note  Patient Details Name: Tom Scott MRN: [id number] DOB: August 31, 1955   Cancelled Treatment:    Reason Eval/Treat Not Completed: Other (comment).  PT has made two attempts to see pt unsuccessfully, will retry at another time.   Ramond Dial 11/20/2020, 1:24 PM Mee Hives, PT MS Acute Rehab Dept. Number: [place] and [place]

## 2020-11-20 NOTE — Progress Notes (Addendum)
Calorie Count Note  48 hour calorie count ordered.  Diet: Regular  Supplements: Boost Plus Chocolate TID, ProSource Plus BID  Day 2 Results  Dinner: 108 kcal, 6 grams protein Breakfast: 145 kcal, 9 grams protien Lunch: 197 kcal, 8 grams Supplements: 380 kcal, 37 grams protein  Total intake: 830 kcal (37% of minimum estimated needs)  60 protein (48% of minimum estimated needs)  Nutrition Dx: Severe Malnutrition related to acute illness (cholecystitis) as evidenced by mild fat depletion,moderate muscle depletion,energy intake < or equal to 50% for > or equal to 5 days.- Ongoing  Goal: Patient will meet greater than or equal to 90% of their needs- Progressing  Intervention:    Boost Plus chocolate- Each supplement provides 360kcal and 14g protein.    30 ml ProSource Plus BID, each supplement provides 100 kcals and 15 grams protein.   MVI with minerals BID  TUMS 500 mg TID  Vitamin C 500 mg BID  Intake progressed slightly over the last 24 hours but continues to be inadequate. Per wife, patient lethargic after HD and has a hard time staying up to eat. Also, continues to deal with loose stools. Checking for infectious process and if negative MD to provide imodium. Wife to bring in foods from home to help progress intake. Will continue to follow. If intake does not show progression tomorrow. Recommend Cortrak be replaced.   Mariana Single RD, LDN Clinical Nutrition Pager listed in [place]

## 2020-11-20 NOTE — Progress Notes (Signed)
OT Cancellation Note  Patient Details Name: Tom Scott MRN: [id number] DOB: 1955/08/04   Cancelled Treatment:    Reason Eval/Treat Not Completed: Patient at procedure or test/ unavailable (OT has made several attempts for OT tx; Pt at HD. Will contiue to attempt today as time permits)  Tom Scott 11/20/2020, 1:46 PM

## 2020-11-20 NOTE — Progress Notes (Addendum)
Referring Physician(s): Dr. Franco Collet / Dr. Jeanmarie Hubert Dr. Royce Macadamia, L.   Supervising Physician: Mir, Biochemist, clinical  Patient Status:  [facility] - Closed - In-pt  Chief Complaint:  S/p perc choely drain placement with Dr. Annamaria Boots on 5/20 S/p tunneled HD catheter placement with Dr. Serafina Royals on 5/30  Subjective:  Pt laying in bed, not in acute distress. RN and wife at the bedside.  No complaints about the drain. Pt went for HD today.   Allergies: Penicillamine and Penicillins  Medications: Prior to Admission medications   Medication Sig Start Date End Date Taking? Authorizing Provider  amLODipine (NORVASC) 10 MG tablet Take 10 mg by mouth every morning. 10/18/20  Yes [provider]  aspirin EC 81 MG tablet Take 162 mg by mouth daily. Swallow whole.   Yes [provider]  furosemide (LASIX) 20 MG tablet Take 40 mg by mouth daily. 09/18/20  Yes [provider]  hydrOXYzine (ATARAX/VISTARIL) 10 MG tablet Take 10-20 mg by mouth at bedtime. 10/29/20  Yes [provider]  irbesartan (AVAPRO) 150 MG tablet Take 150 mg by mouth daily. 08/30/20  Yes [provider]  metFORMIN (GLUCOPHAGE) 500 MG tablet Take 1,000 mg by mouth 2 (two) times daily. 09/14/20  Yes [provider]  metoprolol succinate (TOPROL-XL) 50 MG 24 hr tablet Take 50 mg by mouth every morning. 08/11/20  Yes [provider]  NONFORMULARY OR COMPOUNDED ITEM Apply 1 application topically 2 (two) times daily. TAC 0.1% cream + Hydrophor c&m 0.5% No more than a week at a time 08/13/20  Yes [provider]  rosuvastatin (CRESTOR) 10 MG tablet Take 10 mg by mouth at bedtime. 09/06/20  Yes [provider]     Vital Signs: BP 110/63 (BP Location: Left Arm)   Pulse 97   Temp 98.4 F (36.9 C) (Oral)   Resp 18   Ht 5' 10"  (1.778 m)   Wt 262 lb 5.6 oz (119 kg)   SpO2 95%   BMI 37.64 kg/m   Physical Exam Vitals reviewed.  Constitutional:      General: He is not in acute  distress.    Appearance: He is ill-appearing.     Comments: Appears lethargic   HENT:     Head: Normocephalic and atraumatic.  Neck:     Comments: Positive for right tunneled HD catheter.Site is unremarkable with no erythema, edema, tenderness, bleeding or drainage.  Cardiovascular:     Rate and Rhythm: Normal rate.  Pulmonary:     Effort: Pulmonary effort is normal.  Abdominal:     General: Abdomen is flat.     Palpations: Abdomen is soft.     Comments: Positive RUQ drain to a foley bag. Site with mild erythema and leakage of clear fluid. Site otherwise stable with no tenderness or bleeding. Suture and stat lock in place.  Dressing is mildly wet with clear yellow fluid.  100 ml of dark brown colored fluid noted in the foley bag. Drain aspirates and flushes well.   Musculoskeletal:     Comments: RUE swelling noted.  Skin:    General: Skin is warm and dry.     Coloration: Skin is not pale.  Neurological:     Mental Status: Mental status is at baseline.     Imaging: IR Fluoro Guide CV Line Right  Result Date: 11/19/2020 INDICATION: 65 year old male with history of acute kidney injury requiring long-term hemodialysis. EXAM: TUNNELED CENTRAL VENOUS HEMODIALYSIS CATHETER PLACEMENT WITH ULTRASOUND AND FLUOROSCOPIC GUIDANCE MEDICATIONS:  Vancomycin 1 gm IV . The antibiotic was given in an appropriate time interval prior to skin puncture. ANESTHESIA/SEDATION: Moderate (conscious) sedation was employed during this procedure. A total of Versed 1 mg and Fentanyl 100 mcg was administered intravenously. Moderate Sedation Time: 13 minutes. The patient's level of consciousness and vital signs were monitored continuously by radiology nursing throughout the procedure under my direct supervision. FLUOROSCOPY TIME:  0 minutes 12 seconds (6 mGy). COMPLICATIONS: None immediate. PROCEDURE: Informed written consent was obtained from the patient after a discussion of the risks, benefits, and alternatives to  treatment. Questions regarding the procedure were encouraged and answered. The right neck and chest were prepped with chlorhexidine in a sterile fashion, and a sterile drape was applied covering the operative field. Maximum barrier sterile technique with sterile gowns and gloves were used for the procedure. A timeout was performed prior to the initiation of the procedure. After creating a small venotomy incision, a 21 gauge micropuncture kit was utilized to access the internal jugular vein. Real-time ultrasound guidance was utilized for vascular access including the acquisition of a permanent ultrasound image documenting patency of the accessed vessel. A Rosen wire was advanced to the level of the IVC and the micropuncture sheath was exchanged for an 8 Fr dilator. A 14.5 French tunneled hemodialysis catheter measuring 23 cm from tip to cuff was tunneled in a retrograde fashion from the anterior chest wall to the venotomy incision. Serial dilation was then performed an a peel-away sheath was placed. The catheter was then placed through the peel-away sheath with the catheter tip ultimately positioned within the right atrium. Final catheter positioning was confirmed and documented with a spot radiographic image. The catheter aspirates and flushes normally. The catheter was flushed with appropriate volume heparin dwells. The catheter exit site was secured with a 0-Silk retention suture. The venotomy incision was closed with Dermabond. Sterile dressings were applied. The patient tolerated the procedure well without immediate post procedural complication. IMPRESSION: Successful placement of 23 cm tip to cuff tunneled hemodialysis catheter via the right internal jugular vein with catheter tip terminating within the right atrium. The catheter is ready for immediate use. Ruthann Cancer, MD [facility] Radiology Electronically Signed   By: Ruthann Cancer MD   On: 11/19/2020 11:06    IR US Guide Vasc Access Right  Result Date: 11/19/2020 INDICATION: 65 year old male with history of acute kidney injury requiring long-term hemodialysis. EXAM: TUNNELED CENTRAL VENOUS HEMODIALYSIS CATHETER PLACEMENT WITH ULTRASOUND AND FLUOROSCOPIC GUIDANCE MEDICATIONS: Vancomycin 1 gm IV . The antibiotic was given in an appropriate time interval prior to skin puncture. ANESTHESIA/SEDATION: Moderate (conscious) sedation was employed during this procedure. A total of Versed 1 mg and Fentanyl 100 mcg was administered intravenously. Moderate Sedation Time: 13 minutes. The patient's level of consciousness and vital signs were monitored continuously by radiology nursing throughout the procedure under my direct supervision. FLUOROSCOPY TIME:  0 minutes 12 seconds (6 mGy). COMPLICATIONS: None immediate. PROCEDURE: Informed written consent was obtained from the patient after a discussion of the risks, benefits, and alternatives to treatment. Questions regarding the procedure were encouraged and answered. The right neck and chest were prepped with chlorhexidine in a sterile fashion, and a sterile drape was applied covering the operative field. Maximum barrier sterile technique with sterile gowns and gloves were used for the procedure. A timeout was performed prior to the initiation of the procedure. After creating a small venotomy incision, a 21 gauge micropuncture kit was utilized to access  the internal jugular vein. Real-time ultrasound guidance was utilized for vascular access including the acquisition of a permanent ultrasound image documenting patency of the accessed vessel. A Rosen wire was advanced to the level of the IVC and the micropuncture sheath was exchanged for an 8 Fr dilator. A 14.5 French tunneled hemodialysis catheter measuring 23 cm from tip to cuff was tunneled in a retrograde fashion from the anterior chest wall to the venotomy incision. Serial dilation was then performed an a peel-away sheath was  placed. The catheter was then placed through the peel-away sheath with the catheter tip ultimately positioned within the right atrium. Final catheter positioning was confirmed and documented with a spot radiographic image. The catheter aspirates and flushes normally. The catheter was flushed with appropriate volume heparin dwells. The catheter exit site was secured with a 0-Silk retention suture. The venotomy incision was closed with Dermabond. Sterile dressings were applied. The patient tolerated the procedure well without immediate post procedural complication. IMPRESSION: Successful placement of 23 cm tip to cuff tunneled hemodialysis catheter via the right internal jugular vein with catheter tip terminating within the right atrium. The catheter is ready for immediate use. Ruthann Cancer, MD [facility] Radiology Electronically Signed   By: Ruthann Cancer MD   On: 11/19/2020 11:06    Labs:  CBC: Recent Labs    11/17/20 0310 11/18/20 0302 11/19/20 0327 11/20/20 0112  WBC 11.3* 11.9* 13.2* 13.8*  HGB 7.7* 7.4* 7.3* 7.0*  HCT 21.5* 22.1* 23.3* 21.6*  PLT 128* 154 216 266    COAGS: Recent Labs    11/08/20 1426 11/19/20 0327  INR 1.4* 1.8*    BMP: Recent Labs    11/16/20 0500 11/17/20 0310 11/18/20 0302 11/19/20 0327  NA 135 133* 133* 132*  K 2.9* 3.1* 3.4* 3.2*  CL 103 102 98 98  CO2 24 22 25 22   GLUCOSE 164* 197* 165* 161*  BUN 50* 66* 47* 65*  CALCIUM 7.9* 7.8* 7.8* 8.0*  CREATININE 4.93* 5.90* 4.59* 6.04*  GFRNONAA 12* 10* 13* 10*    LIVER FUNCTION TESTS: Recent Labs    11/17/20 0310 11/18/20 0302 11/19/20 0327 11/20/20 0112  BILITOT 1.5* 1.4* 1.3* 1.2  AST 157* 175* 60* 46*  ALT 58* 79* 48* 39  ALKPHOS 190* 191* 159* 144*  PROT 5.3* 5.7* 5.9* 5.4*  ALBUMIN 1.7*  1.7* 1.9*  1.9* 1.7*  1.7* 1.7*    Assessment and Plan:  65 yo male with acute cholecystitis, s/p perc cholecystomy tube placement on 5/20 with  Dr. Annamaria Boots.  Hospital course complicated by AKI, s/p tunneled HD catheter placement with Dr. Serafina Royals on 5/30.   Patient stable.  OP 300 cc, cx E coli  VSS CBC with mild leukocytopenia and anemia, essentially unchanged x 2 days  Rt IJ puncture site unremarkable   Cholecystomy drain will need to remain in place at least 4-6 weeks unless gallbladder surgically removed in interim; continue tid drain flushes in house and record OP daily.    Further treatment plan per Nephrology/TRH/ GI  Appreciate and agree with the plan.  IR to follow.    Electronically Signed: Tera Mater, PA-C 11/20/2020, 9:15 AM   I spent a total of 15 Minutes at the the patient's bedside AND on the patient's hospital floor or unit, greater than 50% of which was counseling/coordinating care for perc choley drain and tunneled HD catheter.

## 2020-11-21 ENCOUNTER — Inpatient Hospital Stay (HOSPITAL_COMMUNITY): Payer: Medicare (Managed Care)

## 2020-11-21 DIAGNOSIS — E43 Unspecified severe protein-calorie malnutrition: Secondary | ICD-10-CM | POA: Diagnosis not present

## 2020-11-21 DIAGNOSIS — M7989 Other specified soft tissue disorders: Secondary | ICD-10-CM | POA: Diagnosis not present

## 2020-11-21 DIAGNOSIS — N179 Acute kidney failure, unspecified: Secondary | ICD-10-CM | POA: Diagnosis not present

## 2020-11-21 DIAGNOSIS — R945 Abnormal results of liver function studies: Secondary | ICD-10-CM

## 2020-11-21 DIAGNOSIS — R6521 Severe sepsis with septic shock: Secondary | ICD-10-CM | POA: Diagnosis not present

## 2020-11-21 DIAGNOSIS — M79601 Pain in right arm: Secondary | ICD-10-CM | POA: Diagnosis not present

## 2020-11-21 DIAGNOSIS — A419 Sepsis, unspecified organism: Secondary | ICD-10-CM | POA: Diagnosis not present

## 2020-11-21 DIAGNOSIS — R1011 Right upper quadrant pain: Secondary | ICD-10-CM

## 2020-11-21 LAB — CBC
HCT: 22 % — ABNORMAL LOW (ref 39.0–52.0)
Hemoglobin: 7 g/dL — ABNORMAL LOW (ref 13.0–17.0)
MCH: 26.5 pg (ref 26.0–34.0)
MCHC: 31.8 g/dL (ref 30.0–36.0)
MCV: 83.3 fL (ref 80.0–100.0)
Platelets: 328 10*3/uL (ref 150–400)
RBC: 2.64 MIL/uL — ABNORMAL LOW (ref 4.22–5.81)
RDW: 21.6 % — ABNORMAL HIGH (ref 11.5–15.5)
WBC: 12.6 10*3/uL — ABNORMAL HIGH (ref 4.0–10.5)
nRBC: 0 % (ref 0.0–0.2)

## 2020-11-21 LAB — GLUCOSE, CAPILLARY
Glucose-Capillary: 167 mg/dL — ABNORMAL HIGH (ref 70–99)
Glucose-Capillary: 131 mg/dL — ABNORMAL HIGH (ref 70–99)
Glucose-Capillary: 169 mg/dL — ABNORMAL HIGH (ref 70–99)
Glucose-Capillary: 177 mg/dL — ABNORMAL HIGH (ref 70–99)

## 2020-11-21 LAB — COMPREHENSIVE METABOLIC PANEL
ALT: 35 U/L (ref 0–44)
AST: 48 U/L — ABNORMAL HIGH (ref 15–41)
Albumin: 1.6 g/dL — ABNORMAL LOW (ref 3.5–5.0)
Alkaline Phosphatase: 139 U/L — ABNORMAL HIGH (ref 38–126)
Anion gap: 13 (ref 5–15)
BUN: 42 mg/dL — ABNORMAL HIGH (ref 8–23)
CO2: 24 mmol/L (ref 22–32)
Calcium: 8.1 mg/dL — ABNORMAL LOW (ref 8.9–10.3)
Chloride: 93 mmol/L — ABNORMAL LOW (ref 98–111)
Creatinine, Ser: 4.78 mg/dL — ABNORMAL HIGH (ref 0.61–1.24)
GFR, Estimated: 13 mL/min — ABNORMAL LOW (ref >60)
Glucose, Bld: 160 mg/dL — ABNORMAL HIGH (ref 70–99)
Potassium: 2.9 mmol/L — ABNORMAL LOW (ref 3.5–5.1)
Sodium: 130 mmol/L — ABNORMAL LOW (ref 135–145)
Total Bilirubin: 1.3 mg/dL — ABNORMAL HIGH (ref 0.3–1.2)
Total Protein: 5.5 g/dL — ABNORMAL LOW (ref 6.5–8.1)

## 2020-11-21 MED ORDER — LOPERAMIDE HCL 2 MG PO CAPS
4.0000 mg | ORAL_CAPSULE | Freq: Three times a day (TID) | ORAL | Status: DC | PRN
  Administered 2020-11-21 – 2020-11-30 (×7): 4 mg via ORAL
  Filled 2020-11-21 (×7): qty 2

## 2020-11-21 MED ORDER — POTASSIUM CHLORIDE CRYS ER 20 MEQ PO TBCR
30.0000 meq | EXTENDED_RELEASE_TABLET | Freq: Two times a day (BID) | ORAL | Status: AC
  Administered 2020-11-21 (×2): 30 meq via ORAL
  Filled 2020-11-21 (×2): qty 1

## 2020-11-21 NOTE — Evaluation (Signed)
Occupational Therapy Evaluation Patient Details Name: Tom Scott MRN: [id number] DOB: 11/23/1955 Today's Date: 11/21/2020    History of Present Illness Tom Scott is a 65 y.o. male initially presented to [facility] in the hospital secondary to chest pain and epigastric pain with concern for choledocholithiasis.  He was transferred to [facility] for ERCP and due to septic shock requiring vasopressor support.  He was admitted to the ICU.  General surgery and gastroenterology were consulted for management of his acute calculus cholecystitis.  IR consulted for percutaneous cholecystostomy and patient managed with empiric antibiotics.  While admitted, patient developed ATN requiring initiation of hemodialysis.  Also while admitted, patient was found to have evidence of ESBL E. coli bacteremia   PMH significant for history of atrial fibrillation, hypertension, diabetes mellitus, hyperlipidemia.   Clinical Impression   Pt admitted to the ED for concerns listed above. 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 At the time of the evaluation, pt required max-total assist to get cleaned up in bed, due to requiring max A +2 for bed mobility. Pt complained of intense R Knee pain throughout limiting his ability to assist with mobility. Addtionally, pt reported and demonstrated that he has increased weakness and has difficulty raising any of his extremities against gravity. This session, OT assisted pt to the bed with a Maximove Lift. Reqiring Max A +2 to roll onto lift pad prior to move. Acute OT will continue to assist with pt to address all concerns listed below.     Follow Up Recommendations  SNF    Equipment Recommendations  3 in 1 bedside commode;Other (comment) (RW, Both Bariatric size)    Recommendations for Other Services       Precautions / Restrictions Precautions Precautions: Fall;Other  (comment) Precaution Comments: non-tunneled HD catheter left neck Restrictions Weight Bearing Restrictions: No      Mobility Bed Mobility Overal bed mobility: Needs Assistance Bed Mobility: Rolling Rolling: Max assist;+2 for physical assistance;+2 for safety/equipment         General bed mobility comments: Pt required Max +2 for rolling due to pain and weakness    Transfers Overall transfer level: Needs assistance Equipment used: Ambulation equipment used             General transfer comment: Pt used maximove to transfer to chair. Pt required max A +2 for bed mobility to lay on Maximove sling    Balance Overall balance assessment: Needs assistance   Sitting balance-Leahy Scale: Poor         Standing balance comment: Deferred attempt this session due to pain and weakness                           ADL either performed or assessed with clinical judgement   ADL Overall ADL's : Needs assistance/impaired Eating/Feeding: Set up;Bed level Eating/Feeding Details (indicate cue type and reason): Pt is able to feed himself once all containers have been opened Grooming: Wash/dry hands;Wash/dry face;Set up;Bed level Grooming Details (indicate cue type and reason): Pt can complete grooming once all items have been retrieved for him. Upper Body Bathing: Moderate assistance;Bed level   Lower Body Bathing: Total assistance;+2 for physical assistance;+2 for safety/equipment;Sit to/from stand;Sitting/lateral leans Lower Body Bathing Details (indicate cue type and reason): +2 for bed mobility to complete LB bathing Upper Body Dressing : Minimal assistance;Bed level Upper Body Dressing  Details (indicate cue type and reason): Min A due to weakness Lower Body Dressing: Total assistance;+2 for physical assistance;+2 for safety/equipment   Toilet Transfer: Total assistance;+2 for physical assistance;+2 for safety/equipment Toilet Transfer Details (indicate cue type and  reason): Used Maximove for transfer Toileting- Water quality scientist and Hygiene: Total assistance;+2 for physical assistance;+2 for safety/equipment Toileting - Clothing Manipulation Details (indicate cue type and reason): Total for clean up Tub/ Shower Transfer: Total assistance;+2 for physical assistance;+2 for safety/equipment Tub/Shower Transfer Details (indicate cue type and reason): Required maximove for transfer Functional mobility during ADLs: Total assistance;+2 for physical assistance;+2 for safety/equipment General ADL Comments: Pt transferred OOB for the first time in 2 weeks, required maximove due to weakness and Max +2 A for all bed mobility     Vision Baseline Vision/History: Wears glasses Wears Glasses: Reading only Patient Visual Report: No change from baseline Vision Assessment?: No apparent visual deficits     Perception Perception Perception Tested?: No   Praxis Praxis Praxis tested?: Not tested    Pertinent Vitals/Pain Pain Assessment: Faces Faces Pain Scale: Hurts whole lot Pain Location: right knee Pain Descriptors / Indicators: Discomfort;Sharp Pain Intervention(s): Limited activity within patient's tolerance;Monitored during session;Repositioned;Patient requesting pain meds-RN notified     Hand Dominance Right   Extremity/Trunk Assessment Upper Extremity Assessment Upper Extremity Assessment: Generalized weakness;RUE deficits/detail RUE Deficits / Details: Pt is unable to flex shoulder past 30 degrees, it is difficult and takes him time to squeeze his hand into a fist, and he reports at times it feels numb RUE Sensation: decreased light touch RUE Coordination: decreased fine motor;decreased gross motor   Lower Extremity Assessment Lower Extremity Assessment: Defer to PT evaluation       Communication Communication Communication: No difficulties   Cognition Arousal/Alertness: Awake/alert Behavior During Therapy: WFL for tasks  assessed/performed Overall Cognitive Status: Within Functional Limits for tasks assessed                                     General Comments  VSS on RA    Exercises     Shoulder Instructions      Home Living Family/patient expects to be discharged to:: Private residence Living Arrangements: Spouse/significant other Available Help at Discharge: Family;Available 24 hours/day Type of Home: House Home Access: Stairs to enter [employer] of Steps: 4 Entrance Stairs-Rails: Right Home Layout: One level     Bathroom Shower/Tub: Occupational psychologist: Standard Bathroom Accessibility: Yes How Accessible: Accessible via walker Home Equipment: [place] - single point;Shower seat          Prior Functioning/Environment Level of Independence: Independent        Comments: Pt still works as a Public relations account executive Problem List: Decreased strength;Decreased range of motion;Decreased activity tolerance;Impaired balance (sitting and/or standing);Decreased coordination;Decreased safety awareness;Decreased knowledge of use of DME or AE;Obesity;Impaired UE functional use;Pain      OT Treatment/Interventions: Self-care/ADL training;Therapeutic exercise;Energy conservation;DME and/or AE instruction;Patient/family education;Balance training;Therapeutic activities    OT Goals(Current goals can be found in the care plan section) Acute Rehab OT Goals Patient Stated Goal: get back home OT Goal Formulation: With patient/family Time For Goal Achievement: 12/05/20 Potential to Achieve Goals: Good ADL Goals Pt Will Perform Grooming: with min guard assist;sitting Pt Will Perform Upper Body Bathing: with min guard assist;sitting Pt Will Perform Upper Body Dressing: with min guard assist;sitting Additional ADL Goal #  1: Pt will stand using Either a RW or Stedy for 1 min to prepare for standing ADL's.  OT Frequency: Min 2X/week   Barriers to D/C:             Co-evaluation              AM-PAC OT "6 Clicks" Daily Activity     Outcome Measure Help from another person eating meals?: A Little Help from another person taking care of personal grooming?: A Little Help from another person toileting, which includes using toliet, bedpan, or urinal?: Total Help from another person bathing (including washing, rinsing, drying)?: A Lot Help from another person to put on and taking off regular upper body clothing?: A Little Help from another person to put on and taking off regular lower body clothing?: Total 6 Click Score: 13   End of Session Equipment Utilized During Treatment: Other (comment) Mayo Ao) Nurse Communication: Mobility status;Need for lift equipment  Activity Tolerance: Patient tolerated treatment well;Patient limited by pain Patient left: in chair;with call bell/phone within reach;with family/visitor present  OT Visit Diagnosis: Unsteadiness on feet (R26.81);Other abnormalities of gait and mobility (R26.89);Muscle weakness (generalized) (M62.81)                Time: [id number] OT Time Calculation (min): 57 min Charges:  OT General Charges $OT Visit: 1 Visit OT Evaluation $OT Eval Moderate Complexity: 1 Mod OT Treatments $Self Care/Home Management : 38-52 mins  Kellie Murrill H., OTR/L Acute Rehabilitation  Reta Norgren Elane Avereigh Spainhower 11/21/2020, 5:11 PM

## 2020-11-21 NOTE — Progress Notes (Signed)
Patient ID: Tom Scott, male   DOB: 1955-08-09, 65 y.o.   MRN: [id number] [facility] Progress Note   Assessment/ Plan:   1. Acute kidney Injury: Secondary to septic shock/ATN and likely exacerbated by hemodynamic insult from atrial fibrillation with RVR.  Started on hemodialysis on 5/23 and without any evidence of renal recovery so far although he reports passing some urine yesterday with bowel movement. Had hemodialysis yesterday with limited UF for management of azotemia and I will check AM labs tomorrow to decide on need for HD.  2.  Calculus cholecystitis with E. coli bacteremia: Status post percutaneous cholecystostomy on 5/20 with suspected amiodarone associated elevation of LFTs. 3.  Atrial fibrillation with rapid ventricular response: With intermittent tachycardia noted on oral amiodarone. 4.  Microcytic anemia: On oral iron and status post Aranesp-likely compounded by acute/critical illness. 5.  Physical deconditioning: Secondary to acute illness and lower extremity edema-awaiting PT/OT. 6. Hypokalemia: predominantly from diarrheal losses and in part from dialysis. Will replace via oral route.  Subjective:   Reports to be distressed by his diarrhea.   Objective:   BP (!) 110/52 (BP Location: Left Arm)   Pulse 99   Temp 98.6 F (37 C) (Oral)   Resp (!) 24   Ht '5\' 10"'$  (1.778 m)   Wt 118 kg   SpO2 95%   BMI 37.33 kg/m   Intake/Output Summary (Last 24 hours) at 11/21/2020 1028 Last data filed at 11/21/2020 0817 Gross per 24 hour  Intake 200 ml  Output 3200 ml  Net -3000 ml   Weight change:   Physical Exam: Gen: Appears to be fatigued, resting comfortably in bed.  Wife at bedside CVS: Pulse irregularly irregular tachycardia, S1 and S2 normal. Resp: Anteriorly clear to auscultation, no rales/rhonchi.  Left IJ temporary dialysis catheter and right IJ TDC. Abd: Soft, obese, cholecystostomy tube in situ right upper quadrant Ext: 1-2+ pitting edema over lower  extremities  Imaging: No results found. Labs: BMET Recent Labs  Lab 11/15/20 0500 11/16/20 0500 11/17/20 0310 11/18/20 0302 11/19/20 0327 11/20/20 0944 11/21/20 0245  NA 136 135 133* 133* 132* 127* 130*  K 3.2* 2.9* 3.1* 3.4* 3.2* 3.1* 2.9*  CL 102 103 102 98 98 96* 93*  CO2 19* '24 22 25 22 '$ 19* 24  GLUCOSE 164* 164* 197* 165* 161* 178* 160*  BUN 60* 50* 66* 47* 65* 81* 42*  CREATININE 5.76* 4.93* 5.90* 4.59* 6.04* 7.34* 4.78*  CALCIUM 8.3* 7.9* 7.8* 7.8* 8.0* 7.8* 8.1*  PHOS 3.2 3.1 3.7 3.8 5.2*  --   --    CBC Recent Labs  Lab 11/18/20 0302 11/19/20 0327 11/20/20 0112 11/21/20 0245  WBC 11.9* 13.2* 13.8* 12.6*  HGB 7.4* 7.3* 7.0* 7.0*  HCT 22.1* 23.3* 21.6* 22.0*  MCV 77.3* 83.8 81.8 83.3  PLT 154 216 266 328    Medications:    . (feeding supplement) PROSource Plus  30 mL Oral BID BM  . amiodarone  200 mg Oral Daily  . apixaban  5 mg Oral BID  . vitamin C  500 mg Oral BID  . calcium carbonate  1 tablet Oral TID  . Chlorhexidine Gluconate Cloth  6 each Topical Q0600  . cholecalciferol  1,000 Units Oral Daily  . darbepoetin (ARANESP) injection - NON-DIALYSIS  40 mcg Subcutaneous Q Sat-1800  . insulin aspart  0-6 Units Subcutaneous TID WC  . iron polysaccharides  150 mg Oral Daily  . lactose free nutrition  237 mL Oral TID WC  .  lidocaine  1 patch Transdermal Q24H  . multivitamin with minerals  1 tablet Oral BID  . potassium chloride  30 mEq Oral BID  . rosuvastatin  10 mg Oral Daily  . sodium chloride flush  10-40 mL Intracatheter Q12H  . sodium chloride flush  5 mL Intracatheter Q8H  . zinc sulfate  220 mg Oral Daily   Elmarie Shiley, MD 11/21/2020, 10:28 AM

## 2020-11-21 NOTE — Progress Notes (Signed)
RUE Venous duplex completed      June Leap, BS, RDMS, RVT

## 2020-11-21 NOTE — Progress Notes (Signed)
PROGRESS NOTE    Tom Scott  [id number] DOB: 1955-12-23 DOA: 11/08/2020 PCP: Patient, No Pcp Per (Inactive)   Brief Narrative: Tom Scott is a 65 y.o. male with a history of atrial fibrillation, hypertension, diabetes mellitus, hyperlipidemia initially presented to [facility] secondary to chest pain and epigastric pain.  He developed septic shock requiring pressors.  Reported to have cholecystitis with choledocholithiasis and possible need for ERCP.  Also developed A. fib with RVR.  He was transferred to [facility] for ERCP and due to septic shock requiring vasopressor support.  He was admitted to the ICU.  General surgery and gastroenterology were consulted for management of his acute calculus cholecystitis.  IR consulted for percutaneous cholecystostomy and patient managed with empiric antibiotics.  While admitted, patient developed ATN requiring initiation of hemodialysis.  Also while admitted, patient was found to have evidence of ESBL E. coli bacteremia from outside hospital report.  Patient's antibiotics have been transitioned to meropenem to treat his bacteremia and cholecystitis.   Assessment & Plan:   Principal Problem:   Septic shock ([place]) Active Problems:   Pressure injury of skin   Protein-calorie malnutrition, severe   Encounter for central line placement   AKI (acute kidney injury) ([place])   RUQ abdominal pain   Biliary sepsis   Elevated liver function tests   Septic shock Present on admission.  Secondary to cholecystitis and bacteremia. Patient required ICU admission with vasopressor support. Weaned off vasopressors.  Septic shock resolved.  Acute calculus cholecystitis General surgery and IR consulted. IR placed percutaneous cholecystostomy on 5/20.  LFTs have almost normalized. Fluid culture significant for E. Coli, in addition to rare bacteroides fragilis.  IR following regarding percutaneous drain.  Noted minimal drainage yesterday but  now worse.  They plan drain injection and possible repositioning/exchange of percutaneous cholecystostomy tube on 6/2.  GI was asked to reevaluate patient on 5/29 due to rising LFTs again, felt to be secondary to cholecystitis +/- amiodarone.  GI has since signed off on 5/31.  ID following and remains on meropenem.  ESBL E. Coli bacteremia Report received from OSH per PCCM that reports ESBL bacteremia and patient started on meropenem IV. Repeat blood culture (5/29) with no growth to date.  Developed fever of 101.2 overnight 5/31.  Mild leukocytosis/12.6.  ID follow-up appreciated.  RU E venous Doppler negative for DVT or SVT.  Recommend continuing meropenem to complete 14-day course and drain management per IR.  Also recommend removing left IJ CVC if no longer required which could be contributing to fever.  Acute metabolic encephalopathy Secondary to uremia. Resolved.  Acute kidney injury Secondary to ATN secondary to septic shock. Patient started on hemodialysis on 5/23.  Reports making some urine yesterday.  Did have hemodialysis on 5/31.  No clear signs of renal recovery.  Nephrology continues to follow.  Atrial fibrillation with RVR Patient started on amiodarone for control. Currently rate controlled. On Eliquis for stroke prevention.  Rate controlled.  Asymptomatic anemia Patient with unknown baseline. Hemoglobin of 11 on admission with slow downtrend. No obvious source of bleeding. -Hemoglobin 7 over the last 2 days.  On oral iron and s/p Aranesp.  Likely related to critical illness.  Communicated with nephrology to consider blood transfusion at next HD.  Anasarca Secondary to fluid resuscitation with IV fluids in setting of kidney failure in addition to albuminemia. Current I/O of +17.4L.  Volume management across HD.  Essential hypertension Patient is on amlodipine, losartan and metoprolol as an  outpatient. Antihypertensives held secondary to hypotension and septic shock. Blood pressure  now better controlled and stable. Could gradually start home regimen if blood pressure start to worsen.  Diabetes mellitus, type 2 Patient is on metformin as an outpatient.  Reasonable inpatient control on SSI.  Hyperlipidemia -Continue Crestor  Severe malnutrition History of gastric bypass. Worse secondary to critical illness. NG tube inserted on 5/25 and patient started on tube feeds on 5/25. NG tube removed by patient on 5/28. -Oral intake -Dietitian recommendations:  Trial Boost Plus chocolate TID- Each supplement provides 360kcal and 14g protein.    30 ml ProSource Plus BID, each supplement provides 100 kcals and 15 grams protein.   MVI with minerals BID per tube  500 mg calcium carbonate TID per tube  Supplement Vitamin C 500 mg BID   Pressure injury Medial coccyx, not POA  Diarrhea Likely multifactorial due to gallbladder disease and antibiotics.  Communicated with ID and okay to use Imodium, initiated trial.  Hypokalemia Likely due to GI losses and dialysis.  Nephrology replacing.  Follow labs in a.m.  Right upper extremity pain and swelling ?  Related to asymmetric edema complicating underlying arthritis.  Venous Doppler negative for DVT or SVT.  Elevate and monitor.   DVT prophylaxis: Eliquis Code Status:   Code Status: Full Code Family Communication: Discussed in detail with patient spouse at bedside, updated care and answered all questions Disposition Plan: Discharge likely in several days pending management of AKI vs need for outpatient HD, therapy recommendations   Consultants:   PCCM  Gastroenterology  General surgery  Interventional radiology  Nephrology  Procedures:   As noted above.  Antimicrobials:  Flagyl  Cefepime  Meropenem    Subjective: Patient seen along with spouse at bedside.  Having multiple loose stools, soiling bed, reports pain in his joints due to arthritis while nurses are changing him.  Right upper extremity pain  and swelling  Objective: Vitals:   11/20/20 2334 11/21/20 0025 11/21/20 0120 11/21/20 0542  BP: (!) 92/53 (!) 92/56 (!) 101/53 (!) 110/52  Pulse: 85 93 91 99  Resp: (!) 24 (!) 26 (!) 22 (!) 24  Temp: 99.5 F (37.5 C) 99.1 F (37.3 C) 98.8 F (37.1 C) 98.6 F (37 C)  TempSrc: Oral Oral Oral Oral  SpO2: 94% 95% 94% 95%  Weight:      Height:        Intake/Output Summary (Last 24 hours) at 11/21/2020 1704 Last data filed at 11/21/2020 [id number] Gross per 24 hour  Intake 200 ml  Output 200 ml  Net 0 ml   Filed Weights   11/19/20 0500 11/20/20 0920 11/20/20 1335  Weight: 119 kg 121 kg 118 kg    Examination:  General exam: Middle-age male, moderately built and obese lying uncomfortably propped up in bed but in no obvious painful or respiratory distress. Respiratory system: Slightly diminished breath sounds in the bases but otherwise clear to auscultation. Cardiovascular system: S1 & S2 heard, irregularly irregular. No JVD, murmurs, rubs, gallops or clicks.  Trace bilateral ankle edema.  Telemetry personally reviewed: A. fib with controlled ventricular rate. Gastrointestinal system: Abdomen is nondistended, soft and nontender. No organomegaly or masses felt. Normal bowel sounds heard. Central nervous system: Alert and oriented. No focal neurological deficits. Extremities: Symmetric 5 x 5 power.  However overall appears weak and deconditioned. Skin: No rashes, lesions or ulcers Psychiatry: Judgement and insight appear normal. Mood & affect flat.       Data Reviewed: I  have personally reviewed following labs and imaging studies  CBC Lab Results  Component Value Date   WBC 12.6 (H) 11/21/2020   RBC 2.64 (L) 11/21/2020   HGB 7.0 (L) 11/21/2020   HCT 22.0 (L) 11/21/2020   MCV 83.3 11/21/2020   MCH 26.5 11/21/2020   PLT 328 11/21/2020   MCHC 31.8 11/21/2020   RDW 21.6 (H) 123XX123     Last metabolic panel Lab Results  Component Value Date   NA 130 (L) 11/21/2020   K 2.9  (L) 11/21/2020   CL 93 (L) 11/21/2020   CO2 24 11/21/2020   BUN 42 (H) 11/21/2020   CREATININE 4.78 (H) 11/21/2020   GLUCOSE 160 (H) 11/21/2020   GFRNONAA 13 (L) 11/21/2020   CALCIUM 8.1 (L) 11/21/2020   PHOS 5.2 (H) 11/19/2020   PROT 5.5 (L) 11/21/2020   ALBUMIN 1.6 (L) 11/21/2020   BILITOT 1.3 (H) 11/21/2020   ALKPHOS 139 (H) 11/21/2020   AST 48 (H) 11/21/2020   ALT 35 11/21/2020   ANIONGAP 13 11/21/2020    CBG (last 3)  Recent Labs    11/20/20 2059 11/21/20 0811 11/21/20 1254  GLUCAP 173* 167* 131*     GFR: Estimated Creatinine Clearance: 19.3 mL/min (A) (by C-G formula based on SCr of 4.78 mg/dL (H)).  Coagulation Profile: Recent Labs  Lab 11/19/20 0327  INR 1.8*    Recent Results (from the past 240 hour(s))  Culture, blood (routine x 2)     Status: None (Preliminary result)   Collection Time: 11/18/20  4:57 PM   Specimen: BLOOD RIGHT HAND  Result Value Ref Range Status   Specimen Description BLOOD RIGHT HAND  Final   Special Requests   Final    BOTTLES DRAWN AEROBIC AND ANAEROBIC Blood Culture adequate volume   Culture   Final    NO GROWTH 3 DAYS Performed at [facility], [place] [address], [place], [place] [address]    Report Status PENDING  Incomplete  Culture, blood (routine x 2)     Status: None (Preliminary result)   Collection Time: 11/18/20  4:59 PM   Specimen: BLOOD LEFT HAND  Result Value Ref Range Status   Specimen Description BLOOD LEFT HAND  Final   Special Requests   Final    BOTTLES DRAWN AEROBIC AND ANAEROBIC Blood Culture adequate volume   Culture   Final    NO GROWTH 3 DAYS Performed at [facility], [place] [address] [place], [place] [address]    Report Status PENDING  Incomplete        Radiology Studies: VAS [place] UPPER EXTREMITY VENOUS DUPLEX  Result Date: 11/21/2020 UPPER VENOUS STUDY  Patient Name:  Tom Scott  Date of Exam:   11/21/2020 Medical Rec #: [id number]      Accession #:    [id number] Date of Birth:  06/24/1955      Patient Gender: M Patient Age:   65Y Exam Location:  [facility] Procedure:      VAS [place] UPPER EXTREMITY VENOUS DUPLEX Referring Phys: [id number] RALPH A NETTEY --------------------------------------------------------------------------------  Indications: Pain, and Swelling Comparison Study: none Performing Technologist: June Leap RDMS, RVT  Examination Guidelines: A complete evaluation includes B-mode imaging, spectral Doppler, color Doppler, and power Doppler as needed of all accessible portions of each vessel. Bilateral testing is considered an integral part of a complete examination. Limited examinations for reoccurring indications may be performed as noted.  Right Findings: +----------+------------+---------+-----------+----------+-------+ RIGHT  CompressiblePhasicitySpontaneousPropertiesSummary +----------+------------+---------+-----------+----------+-------+ IJV           Full       Yes       Yes                      +----------+------------+---------+-----------+----------+-------+ Subclavian    Full       Yes       Yes                      +----------+------------+---------+-----------+----------+-------+ Axillary      Full       Yes       Yes                      +----------+------------+---------+-----------+----------+-------+ Brachial      Full                                          +----------+------------+---------+-----------+----------+-------+ Radial        Full                                          +----------+------------+---------+-----------+----------+-------+ Ulnar         Full                                          +----------+------------+---------+-----------+----------+-------+ Cephalic      Full                                          +----------+------------+---------+-----------+----------+-------+ Basilic       Full                                           +----------+------------+---------+-----------+----------+-------+  Left Findings: +----------+------------+---------+-----------+----------+-------+ LEFT      CompressiblePhasicitySpontaneousPropertiesSummary +----------+------------+---------+-----------+----------+-------+ Subclavian               Yes       Yes                      +----------+------------+---------+-----------+----------+-------+  Summary:  Right: No evidence of deep vein thrombosis in the upper extremity. No evidence of superficial vein thrombosis in the upper extremity.  Left: No evidence of thrombosis in the subclavian.  *See table(s) above for measurements and observations.    Preliminary         Scheduled Meds: . (feeding supplement) PROSource Plus  30 mL Oral BID BM  . amiodarone  200 mg Oral Daily  . apixaban  5 mg Oral BID  . vitamin C  500 mg Oral BID  . calcium carbonate  1 tablet Oral TID  . Chlorhexidine Gluconate Cloth  6 each Topical Q0600  . cholecalciferol  1,000 Units Oral Daily  . darbepoetin (ARANESP) injection - NON-DIALYSIS  40 mcg Subcutaneous Q Sat-1800  . insulin aspart  0-6 Units Subcutaneous TID WC  . iron polysaccharides  150 mg Oral Daily  . lactose free nutrition  237  mL Oral TID WC  . lidocaine  1 patch Transdermal Q24H  . multivitamin with minerals  1 tablet Oral BID  . potassium chloride  30 mEq Oral BID  . rosuvastatin  10 mg Oral Daily  . sodium chloride flush  10-40 mL Intracatheter Q12H  . sodium chloride flush  5 mL Intracatheter Q8H  . zinc sulfate  220 mg Oral Daily   Continuous Infusions: . sodium chloride    . meropenem (MERREM) IV 500 mg (11/21/20 1343)     LOS: 13 days    [name], MD, [place], [facility]. [facility]  To contact the attending provider between 7A-7P or the covering provider during after hours 7P-7A, please log into the web site [email or web address] and access using universal [place] password for that web site. If you do not have the  password, please call the hospital operator.

## 2020-11-21 NOTE — TOC Initial Note (Signed)
Transition of Care [facility]) - Initial/Assessment Note    Patient Details  Name: Tom Scott MRN: [id number] Date of Birth: 19-Jun-1956  Transition of Care [facility]) CM/SW Contact:    Emeterio Reeve, [place] Phone Number: 11/21/2020, 4:30 PM  Clinical Narrative:                  CSW met with pt at bedside. CSW introduced self and explained her role at the hospital.  Pts wife was present. Wife stated that PTA pt was independent with mobility and ADL's. Pt is covid vaccinated with 1 booster.  CSW reviewed pt reccs of SNF. Pt in wife in agreement with SNF. Pt has no preferences and allowed CSW to fax out to places in the area.   Pt has three bed offers. CSW gve medicare.gov list to pt and wife to review raating scale. Pts wife inuired about HD. CSW explained that it has not been determined if pt will or will not need HD at this time. CSW confirmed that if HD is needed the SNF will arrange transportation after he has a seat time.   TOC will follow.   Expected Discharge Plan: Skilled Nursing Facility Barriers to Discharge: Continued Medical Work up   Patient Goals and CMS Choice Patient states their goals for this hospitalization and ongoing recovery are:: to get stronger CMS Medicare.gov Compare Post Acute Care list provided to:: Patient Choice offered to / list presented to : [facility]  Expected Discharge Plan and Services Expected Discharge Plan: [place]       Living arrangements for the past 2 months: Single Family Home                                      Prior Living Arrangements/Services Living arrangements for the past 2 months: Single Family Home Lives with:: Spouse Patient language and need for interpreter reviewed:: Yes Do you feel safe going back to the place where you live?: Yes      Need for Family Participation in Patient Care: Yes (Comment) Care giver support system in place?: Yes (comment)   Criminal Activity/Legal Involvement Pertinent  to Current Situation/Hospitalization: No - Comment as needed  Activities of Daily Living      Permission Sought/Granted Permission sought to share information with : Family Chief Financial Officer Permission granted to share information with : Yes, Verbal Permission Granted     Permission granted to share info w AGENCY: SNF        Emotional Assessment Appearance:: Appears stated age Attitude/Demeanor/Rapport: Engaged Affect (typically observed): Appropriate Orientation: : Oriented to Self,Oriented to Place,Oriented to  Time,Oriented to Situation Alcohol / Substance Use: Not Applicable Psych Involvement: No (comment)  Admission diagnosis:  Septic shock ([place]) [A41.9, R65.21] Patient Active Problem List   Diagnosis Date Noted  . RUQ abdominal pain   . Biliary sepsis   . Elevated liver function tests   . Protein-calorie malnutrition, severe 11/14/2020  . Encounter for central line placement   . AKI (acute kidney injury) ([place])   . Pressure injury of skin 11/13/2020  . Septic shock ([place]) 11/08/2020   PCP:  Patient, No Pcp Per (Inactive) Pharmacy:   [id number] [place], [address] [place] [address] Phone: [phone] Fax: [phone]  [facility] [address] [place] [address] Phone: [phone] Fax: [phone]  Social Determinants of Health (SDOH) Interventions    Readmission Risk Interventions No flowsheet data found.  Emeterio Reeve, Latanya Presser, [place] Social Worker [phone]

## 2020-11-21 NOTE — Progress Notes (Signed)
Physical Therapy Treatment Patient Details Name: Tom Scott MRN: [id number] DOB: 09/16/1955 Today's Date: 11/21/2020    History of Present Illness Tom Scott is a 65 y.o. male initially presented to [facility] in the hospital secondary to chest pain and epigastric pain with concern for choledocholithiasis.  He was transferred to [facility] on 11/08/20 for ERCP and due to septic shock requiring vasopressor support.  He was admitted to the ICU.  General surgery and gastroenterology were consulted for management of his acute calculus cholecystitis.  IR consulted for percutaneous cholecystostomy and patient managed with empiric antibiotics.  While admitted, patient developed ATN requiring initiation of hemodialysis.  Also while admitted, patient was found to have evidence of ESBL E. coli bacteremia   PMH significant for history of atrial fibrillation, hypertension, diabetes mellitus, hyperlipidemia.    PT Comments    Pt with slow progress.  OT had transferred pt to recliner with maximove but pt only tolerated 30 min-1 hr due to needing to have BM.  Participated with exercises in chair and then performed maximove back to bed.  Pt too weak to attempt standing - unable to maintain sitting edge of chair for more than a few seconds with mod-max A.  Needs heavy +2 assist to advance.  Continue to advance as able - pt was independent prior to admission.     Follow Up Recommendations  SNF     Equipment Recommendations  Wheelchair cushion (measurements PT);Wheelchair (measurements PT);Hospital bed;3in1 (PT) (hoyer; need further assessement next venue)    Recommendations for Other Services Rehab consult     Precautions / Restrictions Precautions Precautions: Fall;Other (comment) Precaution Comments: non-tunneled HD catheter left neck Restrictions Weight Bearing Restrictions: No    Mobility  Bed Mobility Overal bed mobility: Needs Assistance Bed Mobility: Rolling Rolling: Max assist          General bed mobility comments: Performed partial rolls with Max A for removal of hoyer pad (+2 not available). Facilitated by having pt flex opposite knee and reach as able.    Transfers Overall transfer level: Needs assistance Equipment used: Ambulation equipment used             General transfer comment: Pt was up to chair at arrival.  He was very fatigued and reported needing to have BM.  Did not attempt standing due to pt too weak and needing to have BM (pt with frequent diarrhea).  After exercises used maximove to get pt back to bed  Ambulation/Gait                 Stairs             Wheelchair Mobility    Modified Rankin (Stroke Patients Only)       Balance Overall balance assessment: Needs assistance Sitting-balance support: No upper extremity supported;Feet supported Sitting balance-Leahy Scale: Poor Sitting balance - Comments: Tried sitting at edge of chair but pt requiring min-mod A to maintain for a few seconds with posterior lean       Standing balance comment: Deferred attempt this session due to pain and weakness                            Cognition Arousal/Alertness: Awake/alert Behavior During Therapy: WFL for tasks assessed/performed Overall Cognitive Status: Impaired/Different from baseline  General Comments: Increased time to respond; not formally assessed      Exercises General Exercises - Lower Extremity Ankle Circles/Pumps: AROM;Both;10 reps;Seated Long Arc Quad: AAROM;Both;10 reps;Seated (AAROM for full controlled ROM) Hip Flexion/Marching: AAROM;Both;10 reps;Seated Other Exercises Other Exercises: Bil knee flexion stretch to tolerance with 5 sec hole x 3 in chair Other Exercises: Trunk "sit up" in chair.  Had pt use UE and pull up from leaning on back of chair to edge of chair - 5 x with mod-max A using maximove pad that was behind pt    General Comments  General comments (skin integrity, edema, etc.): VSS      Pertinent Vitals/Pain Pain Assessment: Faces Faces Pain Scale: Hurts even more Pain Location: right knee and R hand with movement Pain Descriptors / Indicators: Discomfort;Sharp Pain Intervention(s): Limited activity within patient's tolerance;Monitored during session;Repositioned    Home Living Family/patient expects to be discharged to:: Private residence Living Arrangements: Spouse/significant other Available Help at Discharge: Family;Available 24 hours/day Type of Home: House Home Access: Stairs to enter Entrance Stairs-Rails: Right Home Layout: One level Home Equipment: Cane - single point;Shower seat      Prior Function Level of Independence: Independent      Comments: Pt still works as a Contractor (current goals can now be found in the care plan section) Acute Rehab PT Goals Patient Stated Goal: get back home PT Goal Formulation: With patient/family Time For Goal Achievement: 12/02/20 Potential to Achieve Goals: Good Progress towards PT goals: Progressing toward goals    Frequency    Min 2X/week      PT Plan Discharge plan needs to be updated (pt screened and denied by CIR, pt's insurance doesn't cover CIR)    Co-evaluation              AM-PAC PT "6 Clicks" Mobility   Outcome Measure  Help needed turning from your back to your side while in a flat bed without using bedrails?: Total Help needed moving from lying on your back to sitting on the side of a flat bed without using bedrails?: Total Help needed moving to and from a bed to a chair (including a wheelchair)?: Total Help needed standing up from a chair using your arms (e.g., wheelchair or bedside chair)?: Total Help needed to walk in hospital room?: Total Help needed climbing 3-5 steps with a railing? : Total 6 Click Score: 6    End of Session Equipment Utilized During Treatment: Gait belt Activity Tolerance: Patient limited by  fatigue Patient left: in bed;with call bell/phone within reach;with family/visitor present Nurse Communication: Mobility status;Need for lift equipment PT Visit Diagnosis: Other abnormalities of gait and mobility (R26.89);Muscle weakness (generalized) (M62.81);Pain Pain - Right/Left: Right Pain - part of body: Knee     Time: [id number] PT Time Calculation (min) (ACUTE ONLY): 26 min  Charges:  $Therapeutic Exercise: 8-22 mins $Therapeutic Activity: 8-22 mins                     Abran Richard, PT Acute Rehab Services Pager [phone] [facility] [phone]     Karlton Lemon 11/21/2020, 5:30 PM

## 2020-11-21 NOTE — Progress Notes (Addendum)
I have seen and examined the patient. I have personally reviewed the clinical findings, laboratory findings, microbiological data and imaging studies. The assessment and treatment plan was discussed with the  Advance Practice Provider, Mauricio Po  I agree with her/his recommendations except following additions/corrections.  One episode of fever yesterday, leukocytosis downtrending. Drain output 200ccs over last 24 hrs. Blood cultures 5/29 are no growth to date   Rt forearm pain/swelling, Duplex of RT UE negative for DVT in deep or superficial veins. No other complaints Continue Meropenem with previous planned end date Left IJ CVC to remove if no longer required and could be contributing to fevers Monitor CBC and BMP on IV antibiotics through end of the week to complete 14 days course  Drain management per IR  Will sign off for now. Call us back with questions   Tom Scott, [place] [facility]ious Disease  Date of Admission:  11/08/2020     Total days of antibiotics 13         ASSESSMENT:  Tom Scott was febrile overnight and his right wrist/forearm is warm and edematous which is concerning for possible DVT or infection. Upper extremity doppler is pending. There is no abscess or lesion in this area and may need additional imaging if doppler negative.  Drain functioning and output decreasing. Blood cultures from 5/29 have remained without growth to date. Continue meropenem through end of the week as planned for now. Was able to produce some urine and HD managed by Nephrology. Drain management per IR.   PLAN:  1. Continue meropenem.  2. Await upper extremity doppler to rule out DVT.  3. Continue drain management per IR. 4. Monitor blood culture results and fever curve. 5. Renal function and dialysis per Nephrology.   Principal Problem:   Septic shock (Tom Scott) Active Problems:   Pressure injury  of skin   Protein-calorie malnutrition, severe   Encounter for central line placement   AKI (acute kidney injury) (Tom Scott)   RUQ abdominal pain   Biliary sepsis   Elevated liver function tests   . (feeding supplement) PROSource Plus  30 mL Oral BID BM  . amiodarone  200 mg Oral Daily  . apixaban  5 mg Oral BID  . vitamin C  500 mg Oral BID  . calcium carbonate  1 tablet Oral TID  . Chlorhexidine Gluconate Cloth  6 each Topical Q0600  . cholecalciferol  1,000 Units Oral Daily  . darbepoetin (ARANESP) injection - NON-DIALYSIS  40 mcg Subcutaneous Q Sat-1800  . insulin aspart  0-6 Units Subcutaneous TID WC  . iron polysaccharides  150 mg Oral Daily  . lactose free nutrition  237 mL Oral TID WC  . lidocaine  1 patch Transdermal Q24H  . multivitamin with minerals  1 tablet Oral BID  . rosuvastatin  10 mg Oral Daily  . sodium chloride flush  10-40 mL Intracatheter Q12H  . sodium chloride flush  5 mL Intracatheter Q8H  . zinc sulfate  220 mg Oral Daily    SUBJECTIVE:  Febrile overnight with max temperature of 101.2 F. Leukocytosis stable. Completed HD yesterday. Blood cultures from 5/29 without growth to date. LFTs around normal. Drain output of 200 cc over 24 hours. Feeling okay. Has increased oral intake. Having diarrhea.   Allergies  Allergen Reactions  . Penicillamine     Other reaction(s): lips swell  . Penicillins     Other  reaction(s): Swelling  Legacy System: CCA Onset Date: <blank> Substance Legacy/Cerner: penicillins / penicillins (Legacy value) Category: Drug Severity Legacy/Cerner: <blank> / Unknown Reaction(s): swelling Comments: <blank>  Legacy System: CCA Onset Date: <blank> Substance Legacy/Cerner: ampicillin / ampicillin (Legacy value) Category: Drug Severity Legacy/Cerner: <blank> / Unknown Reaction(s): swelling Comments: <blank>  Legacy System: CCA Onset Date: <blank> Substance Legacy/Cerner: ampicillin / ampicillin (Legacy value) Category:  Drug Severity Legacy/Cerner: <blank> / Unknown Reaction(s): swelling Comments: <blank>  Legacy System: CCA Onset Date: <blank> Substance Legacy/Cerner: penicillins / penicillins (Legacy value) Category: Drug Severity Legacy/Cerner: <blank> / Unknown Reaction(s): swelling Comments: <blank>      Review of Systems: Review of Systems  Constitutional: Negative for chills, fever and weight loss.  Respiratory: Negative for cough, shortness of breath and wheezing.   Cardiovascular: Negative for chest pain and leg swelling.  Gastrointestinal: Positive for diarrhea. Negative for abdominal pain, constipation, nausea and vomiting.  Musculoskeletal:       Positive for left upper extremity pain.   Skin: Negative for rash.      OBJECTIVE: Vitals:   11/20/20 2334 11/21/20 0025 11/21/20 0120 11/21/20 0542  BP: (!) 92/53 (!) 92/56 (!) 101/53 (!) 110/52  Pulse: 85 93 91 99  Resp: (!) 24 (!) 26 (!) 22 (!) 24  Temp: 99.5 F (37.5 C) 99.1 F (37.3 C) 98.8 F (37.1 C) 98.6 F (37 C)  TempSrc: Oral Oral Oral Oral  SpO2: 94% 95% 94% 95%  Weight:      Height:       Body mass index is 37.33 kg/m.  Physical Exam Constitutional:      General: He is not in acute distress.    Appearance: He is well-developed.     Comments: Lying in bed with head of bed elevated; drowsy.   Cardiovascular:     Rate and Rhythm: Normal rate and regular rhythm.     Heart sounds: Normal heart sounds.  Pulmonary:     Effort: Pulmonary effort is normal.     Breath sounds: Normal breath sounds.  Abdominal:     General: Bowel sounds are normal.     Palpations: Abdomen is soft.     Tenderness: There is no abdominal tenderness. There is no guarding.     Comments: Gall bladder drain in RUQ. Site appears clean and dry. Bilious drainage in bag.   Musculoskeletal:     Comments: Right upper extremity appears edematous with warmth and tenderness from the right rist proximally to mid forearm. Distal pulses and  sensation are intact.   Skin:    General: Skin is warm and dry.  Neurological:     Mental Status: He is alert and oriented to person, place, and time.  Psychiatric:        Behavior: Behavior normal.        Thought Content: Thought content normal.        Judgment: Judgment normal.     Lab Results Lab Results  Component Value Date   WBC 12.6 (H) 11/21/2020   HGB 7.0 (L) 11/21/2020   HCT 22.0 (L) 11/21/2020   MCV 83.3 11/21/2020   PLT 328 11/21/2020    Lab Results  Component Value Date   CREATININE 4.78 (H) 11/21/2020   BUN 42 (H) 11/21/2020   NA 130 (L) 11/21/2020   K 2.9 (L) 11/21/2020   CL 93 (L) 11/21/2020   CO2 24 11/21/2020    Lab Results  Component Value Date   ALT 35 11/21/2020   AST 48 (  H) 11/21/2020   ALKPHOS 139 (H) 11/21/2020   BILITOT 1.3 (H) 11/21/2020     Microbiology: Recent Results (from the past 240 hour(s))  Culture, blood (routine x 2)     Status: None (Preliminary result)   Collection Time: 11/18/20  4:57 PM   Specimen: BLOOD RIGHT HAND  Result Value Ref Range Status   Specimen Description BLOOD RIGHT HAND  Final   Special Requests   Final    BOTTLES DRAWN AEROBIC AND ANAEROBIC Blood Culture adequate volume   Culture   Final    NO GROWTH 3 DAYS Performed at [facility], [place] [address], [place], [place] [address]    Report Status PENDING  Incomplete  Culture, blood (routine x 2)     Status: None (Preliminary result)   Collection Time: 11/18/20  4:59 PM   Specimen: BLOOD LEFT HAND  Result Value Ref Range Status   Specimen Description BLOOD LEFT HAND  Final   Special Requests   Final    BOTTLES DRAWN AEROBIC AND ANAEROBIC Blood Culture adequate volume   Culture   Final    NO GROWTH 3 DAYS Performed at [facility], [place] [address], [place], [place] [address]    Report Status PENDING  Incomplete     Terri Piedra, NP [place]  11/21/2020  8:43 AM

## 2020-11-21 NOTE — Progress Notes (Signed)
Referring Physician(s): Dr. Franco Collet / Dr. Jeanmarie Hubert Dr. Royce Macadamia, L.   Supervising Physician: Jacqulynn Cadet  Patient Status:  [facility] - In-pt  Chief Complaint:  S/p perc choely drain placement with Dr. Annamaria Boots on 5/20 S/p tunneled HD catheter placement with Dr. Serafina Royals on 5/30  Subjective:  Pt laying in bed, not in acute distress. RN, NT and wife at the bedside.  No complaints about the drain.   Allergies: Penicillamine and Penicillins  Medications: Prior to Admission medications   Medication Sig Start Date End Date Taking? Authorizing Provider  amLODipine (NORVASC) 10 MG tablet Take 10 mg by mouth every morning. 10/18/20  Yes [provider]  aspirin EC 81 MG tablet Take 162 mg by mouth daily. Swallow whole.   Yes [provider]  furosemide (LASIX) 20 MG tablet Take 40 mg by mouth daily. 09/18/20  Yes [provider]  hydrOXYzine (ATARAX/VISTARIL) 10 MG tablet Take 10-20 mg by mouth at bedtime. 10/29/20  Yes [provider]  irbesartan (AVAPRO) 150 MG tablet Take 150 mg by mouth daily. 08/30/20  Yes [provider]  metFORMIN (GLUCOPHAGE) 500 MG tablet Take 1,000 mg by mouth 2 (two) times daily. 09/14/20  Yes [provider]  metoprolol succinate (TOPROL-XL) 50 MG 24 hr tablet Take 50 mg by mouth every morning. 08/11/20  Yes [provider]  NONFORMULARY OR COMPOUNDED ITEM Apply 1 application topically 2 (two) times daily. TAC 0.1% cream + Hydrophor c&m 0.5% No more than a week at a time 08/13/20  Yes [provider]  rosuvastatin (CRESTOR) 10 MG tablet Take 10 mg by mouth at bedtime. 09/06/20  Yes [provider]     Vital Signs: BP (!) 110/52 (BP Location: Left Arm)   Pulse 99   Temp 98.6 F (37 C) (Oral)   Resp (!) 24   Ht $R'5\' 10"'RU$  (1.778 m)   Wt 260 lb 2.3 oz (118 kg)   SpO2 95%   BMI 37.33 kg/m   Physical Exam Vitals reviewed.  Constitutional:      General: He is not in acute distress.     Appearance: He is ill-appearing.     Comments: Appears lethargic   HENT:     Head: Normocephalic and atraumatic.  Neck:     Comments: Positive for right tunneled HD catheter.Site is unremarkable with no erythema, edema, tenderness, bleeding or drainage.  Cardiovascular:     Rate and Rhythm: Normal rate.  Pulmonary:     Effort: Pulmonary effort is normal.  Abdominal:     General: Abdomen is flat.     Palpations: Abdomen is soft.     Comments: Positive RUQ drain to a foley bag. Site with mild erythema and leakage of clear yellow fluid. Site otherwise stable with no tenderness or bleeding. Suture and stat lock in place.  Dressing is wet with clear yellow fluid. 200 ml of dark brown colored fluid noted in the foley bag. Drain aspirates and flushes well.   Musculoskeletal:     Comments: RUE swelling noted.  Skin:    General: Skin is warm and dry.     Coloration: Skin is not pale.  Neurological:     Mental Status: Mental status is at baseline.     Imaging: IR Fluoro Guide CV Line Right  Result Date: 11/19/2020 INDICATION: 65 year old male with history of acute kidney injury requiring long-term hemodialysis. EXAM: TUNNELED CENTRAL VENOUS HEMODIALYSIS CATHETER PLACEMENT WITH ULTRASOUND AND FLUOROSCOPIC GUIDANCE MEDICATIONS: Vancomycin 1 gm  IV . The antibiotic was given in an appropriate time interval prior to skin puncture. ANESTHESIA/SEDATION: Moderate (conscious) sedation was employed during this procedure. A total of Versed 1 mg and Fentanyl 100 mcg was administered intravenously. Moderate Sedation Time: 13 minutes. The patient's level of consciousness and vital signs were monitored continuously by radiology nursing throughout the procedure under my direct supervision. FLUOROSCOPY TIME:  0 minutes 12 seconds (6 mGy). COMPLICATIONS: None immediate. PROCEDURE: Informed written consent was obtained from the patient after a discussion of the risks, benefits, and alternatives to treatment.  Questions regarding the procedure were encouraged and answered. The right neck and chest were prepped with chlorhexidine in a sterile fashion, and a sterile drape was applied covering the operative field. Maximum barrier sterile technique with sterile gowns and gloves were used for the procedure. A timeout was performed prior to the initiation of the procedure. After creating a small venotomy incision, a 21 gauge micropuncture kit was utilized to access the internal jugular vein. Real-time ultrasound guidance was utilized for vascular access including the acquisition of a permanent ultrasound image documenting patency of the accessed vessel. A Rosen wire was advanced to the level of the IVC and the micropuncture sheath was exchanged for an 8 Fr dilator. A 14.5 French tunneled hemodialysis catheter measuring 23 cm from tip to cuff was tunneled in a retrograde fashion from the anterior chest wall to the venotomy incision. Serial dilation was then performed an a peel-away sheath was placed. The catheter was then placed through the peel-away sheath with the catheter tip ultimately positioned within the right atrium. Final catheter positioning was confirmed and documented with a spot radiographic image. The catheter aspirates and flushes normally. The catheter was flushed with appropriate volume heparin dwells. The catheter exit site was secured with a 0-Silk retention suture. The venotomy incision was closed with Dermabond. Sterile dressings were applied. The patient tolerated the procedure well without immediate post procedural complication. IMPRESSION: Successful placement of 23 cm tip to cuff tunneled hemodialysis catheter via the right internal jugular vein with catheter tip terminating within the right atrium. The catheter is ready for immediate use. Ruthann Cancer, MD [facility] Electronically Signed   By: Ruthann Cancer MD   On: 11/19/2020 11:06   IR US  Guide Vasc Access Right  Result Date: 11/19/2020 INDICATION: 65 year old male with history of acute kidney injury requiring long-term hemodialysis. EXAM: TUNNELED CENTRAL VENOUS HEMODIALYSIS CATHETER PLACEMENT WITH ULTRASOUND AND FLUOROSCOPIC GUIDANCE MEDICATIONS: Vancomycin 1 gm IV . The antibiotic was given in an appropriate time interval prior to skin puncture. ANESTHESIA/SEDATION: Moderate (conscious) sedation was employed during this procedure. A total of Versed 1 mg and Fentanyl 100 mcg was administered intravenously. Moderate Sedation Time: 13 minutes. The patient's level of consciousness and vital signs were monitored continuously by radiology nursing throughout the procedure under my direct supervision. FLUOROSCOPY TIME:  0 minutes 12 seconds (6 mGy). COMPLICATIONS: None immediate. PROCEDURE: Informed written consent was obtained from the patient after a discussion of the risks, benefits, and alternatives to treatment. Questions regarding the procedure were encouraged and answered. The right neck and chest were prepped with chlorhexidine in a sterile fashion, and a sterile drape was applied covering the operative field. Maximum barrier sterile technique with sterile gowns and gloves were used for the procedure. A timeout was performed prior to the initiation of the procedure. After creating a small venotomy incision, a 21 gauge micropuncture kit was utilized to access the internal jugular  vein. Real-time ultrasound guidance was utilized for vascular access including the acquisition of a permanent ultrasound image documenting patency of the accessed vessel. A Rosen wire was advanced to the level of the IVC and the micropuncture sheath was exchanged for an 8 Fr dilator. A 14.5 French tunneled hemodialysis catheter measuring 23 cm from tip to cuff was tunneled in a retrograde fashion from the anterior chest wall to the venotomy incision. Serial dilation was then performed an a peel-away sheath was placed.  The catheter was then placed through the peel-away sheath with the catheter tip ultimately positioned within the right atrium. Final catheter positioning was confirmed and documented with a spot radiographic image. The catheter aspirates and flushes normally. The catheter was flushed with appropriate volume heparin dwells. The catheter exit site was secured with a 0-Silk retention suture. The venotomy incision was closed with Dermabond. Sterile dressings were applied. The patient tolerated the procedure well without immediate post procedural complication. IMPRESSION: Successful placement of 23 cm tip to cuff tunneled hemodialysis catheter via the right internal jugular vein with catheter tip terminating within the right atrium. The catheter is ready for immediate use. Ruthann Cancer, MD [facility] Electronically Signed   By: Ruthann Cancer MD   On: 11/19/2020 11:06    Labs:  CBC: Recent Labs    11/18/20 0302 11/19/20 0327 11/20/20 0112 11/21/20 0245  WBC 11.9* 13.2* 13.8* 12.6*  HGB 7.4* 7.3* 7.0* 7.0*  HCT 22.1* 23.3* 21.6* 22.0*  PLT 154 216 266 328    COAGS: Recent Labs    11/08/20 1426 11/19/20 0327  INR 1.4* 1.8*    BMP: Recent Labs    11/18/20 0302 11/19/20 0327 11/20/20 0944 11/21/20 0245  NA 133* 132* 127* 130*  K 3.4* 3.2* 3.1* 2.9*  CL 98 98 96* 93*  CO2 25 22 19* 24  GLUCOSE 165* 161* 178* 160*  BUN 47* 65* 81* 42*  CALCIUM 7.8* 8.0* 7.8* 8.1*  CREATININE 4.59* 6.04* 7.34* 4.78*  GFRNONAA 13* 10* 8* 13*    LIVER FUNCTION TESTS: Recent Labs    11/18/20 0302 11/19/20 0327 11/20/20 0112 11/21/20 0245  BILITOT 1.4* 1.3* 1.2 1.3*  AST 175* 60* 46* 48*  ALT 79* 48* 39 35  ALKPHOS 191* 159* 144* 139*  PROT 5.7* 5.9* 5.4* 5.5*  ALBUMIN 1.9*  1.9* 1.7*  1.7* 1.7* 1.6*    Assessment and Plan:  65 yo male with acute cholecystitis, s/p perc cholecystomy tube placement on 5/20 with Dr. Annamaria Boots.   Hospital course complicated by AKI, s/p tunneled HD catheter placement with Dr. Serafina Royals on 5/30.   OP 200 cc, cx E coli  VS hypotensive and tachycardic but at baseline  CBC with mild leukocytopenia and anemia, essentially unchanged x 2 days  Rt IJ puncture site unremarkable  Drain flushes/aspirates well.   Clear yellow drainage noted on the dressing x 2 days.  Drainage was minimal yesterday but now worse, dressing almost saturated.  Discussed with Dr. Laurence Ferrari, will schedule patient for drain injection and possible reposition/exchange of the perc chole tube.  Made npo at midnight. RN notified the plan.   Cholecystomy drain will need to remain in place at least 4-6 weeks unless gallbladder surgically removed in interim; continue tid drain flushes in house and record OP daily.   Further treatment plan per Nephrology/TRH/ GI  Appreciate and agree with the plan.  IR to follow.    Electronically Signed: Tera Mater, PA-C 11/21/2020, 10:59  AM   I spent a total of 25 Minutes at the the patient's bedside AND on the patient's hospital floor or unit, greater than 50% of which was counseling/coordinating care for perc choley drain and tunneled HD catheter.

## 2020-11-22 ENCOUNTER — Inpatient Hospital Stay (HOSPITAL_COMMUNITY): Payer: Medicare (Managed Care)

## 2020-11-22 LAB — RENAL FUNCTION PANEL
Albumin: 1.7 g/dL — ABNORMAL LOW (ref 3.5–5.0)
Anion gap: 12 (ref 5–15)
BUN: 54 mg/dL — ABNORMAL HIGH (ref 8–23)
CO2: 22 mmol/L (ref 22–32)
Calcium: 7.9 mg/dL — ABNORMAL LOW (ref 8.9–10.3)
Chloride: 95 mmol/L — ABNORMAL LOW (ref 98–111)
Creatinine, Ser: 5.65 mg/dL — ABNORMAL HIGH (ref 0.61–1.24)
GFR, Estimated: 10 mL/min — ABNORMAL LOW (ref >60)
Glucose, Bld: 160 mg/dL — ABNORMAL HIGH (ref 70–99)
Phosphorus: 6.4 mg/dL — ABNORMAL HIGH (ref 2.5–4.6)
Potassium: 3.1 mmol/L — ABNORMAL LOW (ref 3.5–5.1)
Sodium: 129 mmol/L — ABNORMAL LOW (ref 135–145)

## 2020-11-22 LAB — CBC
HCT: 21.6 % — ABNORMAL LOW (ref 39.0–52.0)
Hemoglobin: 7 g/dL — ABNORMAL LOW (ref 13.0–17.0)
MCH: 26.4 pg (ref 26.0–34.0)
MCHC: 32.4 g/dL (ref 30.0–36.0)
MCV: 81.5 fL (ref 80.0–100.0)
Platelets: 379 10*3/uL (ref 150–400)
RBC: 2.65 MIL/uL — ABNORMAL LOW (ref 4.22–5.81)
RDW: 21.8 % — ABNORMAL HIGH (ref 11.5–15.5)
WBC: 12 10*3/uL — ABNORMAL HIGH (ref 4.0–10.5)
nRBC: 0 % (ref 0.0–0.2)

## 2020-11-22 LAB — GLUCOSE, CAPILLARY
Glucose-Capillary: 155 mg/dL — ABNORMAL HIGH (ref 70–99)
Glucose-Capillary: 120 mg/dL — ABNORMAL HIGH (ref 70–99)
Glucose-Capillary: 189 mg/dL — ABNORMAL HIGH (ref 70–99)

## 2020-11-22 LAB — SEDIMENTATION RATE: Sed Rate: 94 mm/hr — ABNORMAL HIGH (ref 0–16)

## 2020-11-22 LAB — PREPARE RBC (CROSSMATCH)

## 2020-11-22 LAB — ABO/RH: ABO/RH(D): A POS

## 2020-11-22 LAB — C-REACTIVE PROTEIN: CRP: 26.3 mg/dL — ABNORMAL HIGH (ref <1.0)

## 2020-11-22 MED ORDER — HEPARIN SODIUM (PORCINE) 1000 UNIT/ML IJ SOLN
INTRAMUSCULAR | Status: AC
  Administered 2020-11-22: 3800 [IU]
  Filled 2020-11-22: qty 4

## 2020-11-22 MED ORDER — SODIUM CHLORIDE 0.9% IV SOLUTION: Freq: Once | INTRAVENOUS | Status: DC

## 2020-11-22 NOTE — Progress Notes (Signed)
Nutrition Follow-up  DOCUMENTATION CODES:   Severe malnutrition in context of acute illness/injury  INTERVENTION:   Recommend Cortrak replacement with EN given malnutrition status and poor PO. Patient declines.   -Continue Boost Plus chocolate TID- Each supplement provides 360kcal and 14g protein.    -Continue 30 ml ProSource Plus BID, each supplement provides 100 kcals and 15 grams protein.   -MVI with minerals BID per tube  -500 mg calcium carbonate TID per tube  -Supplement Vitamin C 500 mg BID  NUTRITION DIAGNOSIS:   Severe Malnutrition related to acute illness (cholecystitis) as evidenced by mild fat depletion,moderate muscle depletion,energy intake < or equal to 50% for > or equal to 5 days. Ongoing  GOAL:   Patient will meet greater than or equal to 90% of their needs  Not meeting  MONITOR:   Diet advancement,Labs,Weight trends,TF tolerance,Skin,I & O's  REASON FOR ASSESSMENT:   NPO/Clear Liquid Diet    ASSESSMENT:   65 year old male who presented on 5/[facility] from [facility] with septic shock, cholecystitis. 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 Pt admitted with acute calculous cholecystitis, AKI.  5/20 - NG tube placed, percutaneous cholecystostomy tube placement 5/21 - NG tube removed, clear liquids 5/23 - first HD, NPO 5/25 - Cortrak placed (in jejunum), diet advanced to Regular 5/28 - Cortrak pulled   No signs of renal recovery at this time, had HD this am. Plan repositioning/exchange of perc tube later today or tomorrow.   Intake remains poor. Last four meal completions charted as 50%, 25%, 50%, and 0%. Taking 1-2 Boost Plus/Breeze daily (given a mixture of both supplements per wife). Taking Prosource BID. Diarrhea has slowed with imodium. Discussed with patient though intake has progressed it remains in adequate. Discussed malnutrition status and greater risk for ongoing malnutrition/nutrient  deficiencies given hx of gastric bypass. Patient would benefit from Cortrak replacement given these factors but patient does not wish to go through with it at this time.   Admit weight: 98 kg Current weight: 118.8 kg  Vitamin/Mineral Profile:  Thiamine B1: pending Vitamin B12: 2699 (H) Vitamin A: pending Vitamin D: 17.47 (L) Vitamin C: <0.1 (L) Zinc: 37 (L)  Perc chole drain: 370 ml x 24 hours  Medications: SS novolog, TUMS TID, Vitamin D, aranesp, MVI x2, zinc sulfate 220 mg, Vitamin C 500 mg BID  Labs: Na 129 (L) K 3.1 (L) Phosphorus 6.4 (H) CBG 120-177  Diet Order:   Diet Order            Diet regular Room service appropriate? Yes; Fluid consistency: Thin  Diet effective now                 EDUCATION NEEDS:   No education needs have been identified at this time  Skin:  Skin Assessment: Skin Integrity Issues: Stage II: coccyx MASD- buttocks   Last BM:  6/2  Height:   Ht Readings from Last 1 Encounters:  11/08/20 '5\' 10"'$  (1.778 m)    Weight:   Wt Readings from Last 1 Encounters:  11/22/20 118.8 kg    BMI:  Body mass index is 37.58 kg/m.  Estimated Nutritional Needs:   Kcal:  2250-2450  Protein:  125-145 grams  Fluid:  >/= 2.0 L  Mariana Single RD, LDN Clinical Nutrition Pager listed in [place]

## 2020-11-22 NOTE — Progress Notes (Signed)
Referring Physician(s): Dr. Franco Collet / Dr. Jeanmarie Hubert Dr. Royce Macadamia, L.   Supervising Physician: Aletta Edouard  Patient Status:  [facility] - In-pt  Chief Complaint:  S/p perc choely drain placement with Dr. Annamaria Boots on 5/20 S/p tunneled HD catheter placement with Dr. Serafina Royals on 5/30  Subjective:  Patient seen when he was brought down for perc choley check with possible reposition/exchange.  Pt laying in bed, not in acute distress. No complaints regarding the drain.     Allergies: Penicillamine and Penicillins  Medications: Prior to Admission medications   Medication Sig Start Date End Date Taking? Authorizing Provider  amLODipine (NORVASC) 10 MG tablet Take 10 mg by mouth every morning. 10/18/20  Yes [provider]  aspirin EC 81 MG tablet Take 162 mg by mouth daily. Swallow whole.   Yes [provider]  furosemide (LASIX) 20 MG tablet Take 40 mg by mouth daily. 09/18/20  Yes [provider]  hydrOXYzine (ATARAX/VISTARIL) 10 MG tablet Take 10-20 mg by mouth at bedtime. 10/29/20  Yes [provider]  irbesartan (AVAPRO) 150 MG tablet Take 150 mg by mouth daily. 08/30/20  Yes [provider]  metFORMIN (GLUCOPHAGE) 500 MG tablet Take 1,000 mg by mouth 2 (two) times daily. 09/14/20  Yes [provider]  metoprolol succinate (TOPROL-XL) 50 MG 24 hr tablet Take 50 mg by mouth every morning. 08/11/20  Yes [provider]  NONFORMULARY OR COMPOUNDED ITEM Apply 1 application topically 2 (two) times daily. TAC 0.1% cream + Hydrophor c&m 0.5% No more than a week at a time 08/13/20  Yes [provider]  rosuvastatin (CRESTOR) 10 MG tablet Take 10 mg by mouth at bedtime. 09/06/20  Yes [provider]     Vital Signs: BP 128/71 (BP Location: Left Arm)   Pulse (!) 112   Temp 98.5 F (36.9 C) (Oral)   Resp 20   Ht 5' 10" (1.778 m)   Wt 261 lb 14.5 oz (118.8 kg)   SpO2 93%   BMI 37.58 kg/m   Physical Exam Vitals  reviewed.  Constitutional:      General: He is not in acute distress.    Appearance: He is ill-appearing.     Comments: Appears lethargic   HENT:     Head: Normocephalic and atraumatic.  Neck:     Comments: Positive for right tunneled HD catheter.Site is unremarkable with no erythema, edema, tenderness, bleeding or drainage.  Cardiovascular:     Rate and Rhythm: Tachycardia present.  Pulmonary:     Effort: Pulmonary effort is normal.  Abdominal:     General: Abdomen is flat.     Palpations: Abdomen is soft.     Comments: Positive RUQ drain to a foley bag. Site with mild erythema and leakage of clear yellow fluid. Site otherwise stable with no tenderness or bleeding. Suture and stat lock in place.  Dressing is wet with clear yellow fluid. 20 ml of dark brown colored fluid noted in the foley bag. Drain aspirates and flushes well.   Skin:    General: Skin is warm and dry.     Coloration: Skin is not pale.  Neurological:     Mental Status: Mental status is at baseline.     Imaging: IR Fluoro Guide CV Line Right  Result Date: 11/19/2020 INDICATION: 65 year old male with history of acute kidney injury requiring long-term hemodialysis. EXAM: TUNNELED CENTRAL VENOUS HEMODIALYSIS CATHETER PLACEMENT WITH ULTRASOUND AND FLUOROSCOPIC GUIDANCE MEDICATIONS: Vancomycin 1 gm IV .  The antibiotic was given in an appropriate time interval prior to skin puncture. ANESTHESIA/SEDATION: Moderate (conscious) sedation was employed during this procedure. A total of Versed 1 mg and Fentanyl 100 mcg was administered intravenously. Moderate Sedation Time: 13 minutes. The patient's level of consciousness and vital signs were monitored continuously by radiology nursing throughout the procedure under my direct supervision. FLUOROSCOPY TIME:  0 minutes 12 seconds (6 mGy). COMPLICATIONS: None immediate. PROCEDURE: Informed written consent was obtained from the patient after a discussion of the risks, benefits, and  alternatives to treatment. Questions regarding the procedure were encouraged and answered. The right neck and chest were prepped with chlorhexidine in a sterile fashion, and a sterile drape was applied covering the operative field. Maximum barrier sterile technique with sterile gowns and gloves were used for the procedure. A timeout was performed prior to the initiation of the procedure. After creating a small venotomy incision, a 21 gauge micropuncture kit was utilized to access the internal jugular vein. Real-time ultrasound guidance was utilized for vascular access including the acquisition of a permanent ultrasound image documenting patency of the accessed vessel. A Rosen wire was advanced to the level of the IVC and the micropuncture sheath was exchanged for an 8 Fr dilator. A 14.5 French tunneled hemodialysis catheter measuring 23 cm from tip to cuff was tunneled in a retrograde fashion from the anterior chest wall to the venotomy incision. Serial dilation was then performed an a peel-away sheath was placed. The catheter was then placed through the peel-away sheath with the catheter tip ultimately positioned within the right atrium. Final catheter positioning was confirmed and documented with a spot radiographic image. The catheter aspirates and flushes normally. The catheter was flushed with appropriate volume heparin dwells. The catheter exit site was secured with a 0-Silk retention suture. The venotomy incision was closed with Dermabond. Sterile dressings were applied. The patient tolerated the procedure well without immediate post procedural complication. IMPRESSION: Successful placement of 23 cm tip to cuff tunneled hemodialysis catheter via the right internal jugular vein with catheter tip terminating within the right atrium. The catheter is ready for immediate use. Ruthann Cancer, MD [facility] Electronically Signed   By: Ruthann Cancer MD   On:  11/19/2020 11:06   IR US Guide Vasc Access Right  Result Date: 11/19/2020 INDICATION: 65 year old male with history of acute kidney injury requiring long-term hemodialysis. EXAM: TUNNELED CENTRAL VENOUS HEMODIALYSIS CATHETER PLACEMENT WITH ULTRASOUND AND FLUOROSCOPIC GUIDANCE MEDICATIONS: Vancomycin 1 gm IV . The antibiotic was given in an appropriate time interval prior to skin puncture. ANESTHESIA/SEDATION: Moderate (conscious) sedation was employed during this procedure. A total of Versed 1 mg and Fentanyl 100 mcg was administered intravenously. Moderate Sedation Time: 13 minutes. The patient's level of consciousness and vital signs were monitored continuously by radiology nursing throughout the procedure under my direct supervision. FLUOROSCOPY TIME:  0 minutes 12 seconds (6 mGy). COMPLICATIONS: None immediate. PROCEDURE: Informed written consent was obtained from the patient after a discussion of the risks, benefits, and alternatives to treatment. Questions regarding the procedure were encouraged and answered. The right neck and chest were prepped with chlorhexidine in a sterile fashion, and a sterile drape was applied covering the operative field. Maximum barrier sterile technique with sterile gowns and gloves were used for the procedure. A timeout was performed prior to the initiation of the procedure. After creating a small venotomy incision, a 21 gauge micropuncture kit was utilized to access the internal jugular vein. Real-time  ultrasound guidance was utilized for vascular access including the acquisition of a permanent ultrasound image documenting patency of the accessed vessel. A Rosen wire was advanced to the level of the IVC and the micropuncture sheath was exchanged for an 8 Fr dilator. A 14.5 French tunneled hemodialysis catheter measuring 23 cm from tip to cuff was tunneled in a retrograde fashion from the anterior chest wall to the venotomy incision. Serial dilation was then performed an a  peel-away sheath was placed. The catheter was then placed through the peel-away sheath with the catheter tip ultimately positioned within the right atrium. Final catheter positioning was confirmed and documented with a spot radiographic image. The catheter aspirates and flushes normally. The catheter was flushed with appropriate volume heparin dwells. The catheter exit site was secured with a 0-Silk retention suture. The venotomy incision was closed with Dermabond. Sterile dressings were applied. The patient tolerated the procedure well without immediate post procedural complication. IMPRESSION: Successful placement of 23 cm tip to cuff tunneled hemodialysis catheter via the right internal jugular vein with catheter tip terminating within the right atrium. The catheter is ready for immediate use. Ruthann Cancer, MD [facility] Electronically Signed   By: Ruthann Cancer MD   On: 11/19/2020 11:06   VAS [place] UPPER EXTREMITY VENOUS DUPLEX  Result Date: 11/21/2020 UPPER VENOUS STUDY  Patient Name:  JAHMAL DUNAVANT  Date of Exam:   11/21/2020 Medical Rec #: [id number]      Accession #:    [id number] Date of Birth: [birth year]      Patient Gender: M Patient Age:   65Y Exam [facility] Procedure:      VAS [place] UPPER EXTREMITY VENOUS DUPLEX Referring Phys: [id number] RALPH A NETTEY --------------------------------------------------------------------------------  Indications: Pain, and Swelling Comparison Study: none Performing Technologist: June Leap RDMS, RVT  Examination Guidelines: A complete evaluation includes B-mode imaging, spectral Doppler, color Doppler, and power Doppler as needed of all accessible portions of each vessel. Bilateral testing is considered an integral part of a complete examination. Limited examinations for reoccurring indications may be performed as noted.  Right Findings:  +----------+------------+---------+-----------+----------+-------+ RIGHT     CompressiblePhasicitySpontaneousPropertiesSummary +----------+------------+---------+-----------+----------+-------+ IJV           Full       Yes       Yes                      +----------+------------+---------+-----------+----------+-------+ Subclavian    Full       Yes       Yes                      +----------+------------+---------+-----------+----------+-------+ Axillary      Full       Yes       Yes                      +----------+------------+---------+-----------+----------+-------+ Brachial      Full                                          +----------+------------+---------+-----------+----------+-------+ Radial        Full                                          +----------+------------+---------+-----------+----------+-------+  Ulnar         Full                                          +----------+------------+---------+-----------+----------+-------+ Cephalic      Full                                          +----------+------------+---------+-----------+----------+-------+ Basilic       Full                                          +----------+------------+---------+-----------+----------+-------+  Left Findings: +----------+------------+---------+-----------+----------+-------+ LEFT      CompressiblePhasicitySpontaneousPropertiesSummary +----------+------------+---------+-----------+----------+-------+ Subclavian               Yes       Yes                      +----------+------------+---------+-----------+----------+-------+  Summary:  Right: No evidence of deep vein thrombosis in the upper extremity. No evidence of superficial vein thrombosis in the upper extremity.  Left: No evidence of thrombosis in the subclavian.  *See table(s) above for measurements and observations.  Diagnosing physician: Harold Barban MD Electronically signed by Harold Barban MD on 11/21/2020 at 10:15:03 PM.    Final     Labs:  CBC: Recent Labs    11/19/20 0327 11/20/20 0112 11/21/20 0245 11/22/20 0059  WBC 13.2* 13.8* 12.6* 12.0*  HGB 7.3* 7.0* 7.0* 7.0*  HCT 23.3* 21.6* 22.0* 21.6*  PLT 216 266 328 379    COAGS: Recent Labs    11/08/20 1426 11/19/20 0327  INR 1.4* 1.8*    BMP: Recent Labs    11/19/20 0327 11/20/20 0944 11/21/20 0245 11/22/20 0059  NA 132* 127* 130* 129*  K 3.2* 3.1* 2.9* 3.1*  CL 98 96* 93* 95*  CO2 22 19* 24 22  GLUCOSE 161* 178* 160* 160*  BUN 65* 81* 42* 54*  CALCIUM 8.0* 7.8* 8.1* 7.9*  CREATININE 6.04* 7.34* 4.78* 5.65*  GFRNONAA 10* 8* 13* 10*    LIVER FUNCTION TESTS: Recent Labs    11/18/20 0302 11/19/20 0327 11/20/20 0112 11/21/20 0245 11/22/20 0059  BILITOT 1.4* 1.3* 1.2 1.3*  --   AST 175* 60* 46* 48*  --   ALT 79* 48* 39 35  --   ALKPHOS 191* 159* 144* 139*  --   PROT 5.7* 5.9* 5.4* 5.5*  --   ALBUMIN 1.9*  1.9* 1.7*  1.7* 1.7* 1.6* 1.7*    Assessment and Plan:  65 yo male with acute cholecystitis, s/p perc cholecystomy tube placement on 5/20 with Dr. Annamaria Boots.  Hospital course complicated by AKI, s/p tunneled HD catheter placement with Dr. Serafina Royals on 5/30.   OP 370 cc, cx E coli  VS tachycardic but at baseline  CBC WBC slowly trending down. Anemia at baseline hgb 7.0  Rt IJ puncture site unremarkable  Drain flushes/aspirates well.   Clear yellow drainage noted on the dressing x 3 days.  Drainage was minimal yesterday but now worse, dressing almost saturated.  Discussed with Dr. Laurence Ferrari who recommended drain injection and possible reposition/exchange of the perc chole tube.   Patient was brought down  to IR today for the procedure, patient signed consent. When IR team tried to move patient to IR table, patient stated " What are you doing? I am not doing this until I talk to my wife."  This PA informed the importance of the procedure to the patient, patient still not agreeable  to proceed.  Attempted to contact wife, call could not be made.  Patient states that he will talk to his wife today and will let IR know when he is agreeable to proceed.   RN notified by IR team.  Patient to be transfer back to the floor.   Patient is tentatively scheduled for perc chole check with possible reposition/exchange without sedation tomorrow pending IR schedule.   Meanwhile, cholecystomy drain will need to remain in place at least 4-6 weeks unless gallbladder surgically removed in interim; continue tid drain flushes in house and record OP daily.   Further treatment plan per Nephrology/TRH/ GI  Appreciate and agree with the plan.  IR to follow.    Electronically Signed: Tera Mater, PA-C 11/22/2020, 3:04 PM   I spent a total of 25 Minutes at the the patient's bedside AND on the patient's hospital floor or unit, greater than 50% of which was counseling/coordinating care for perc choley drain and tunneled HD catheter.

## 2020-11-22 NOTE — Progress Notes (Signed)
11/22/20 1500 Tom Scott brought down to Interventional Radiology for Cholecystostomy Drain check.  Pt refused procedure, PA [place] spoke with pt, but still refused.  Pt sent back to his room via transport.

## 2020-11-22 NOTE — TOC Progression Note (Signed)
Transition of Care [facility]) - Progression Note    Patient Details  Name: Tom Scott MRN: [id number] Date of Birth: 1955-09-08  Transition of Care [facility]) CM/SW [place], [place] Phone Number: 11/22/2020, 3:10 PM  Clinical Narrative:     CSW spoke to pt and wife at bediside. Pt chose [facility] for SNF. According to MD pt will need at least 3 more days inpatient. [place] is able to accept pt when ready. Pt will need a covid test proir to [place]. CSW informed [place] they will need to start insurance auth. MD has also not determined if pt will be on HD at [place]. CSW will continue to monitor for updates.   Expected Discharge Plan: [place] Barriers to Discharge: Continued Medical Work up  Expected Discharge Plan and Services Expected Discharge Plan: [place] arrangements for the past 2 months: Single Family Home                                       Social Determinants of Health (SDOH) Interventions    Readmission Risk Interventions No flowsheet data found.  Emeterio Reeve, Latanya Presser, [place] Social Worker [phone]

## 2020-11-22 NOTE — Progress Notes (Signed)
Patient yellow MEWS for temp 100.8 and HR 102. [place], Agricultural consultant notified. Modena Jansky, MD notified via phone call. MD said to give tylenol and recheck vitals per protocol. Tylenol given, will recheck vitals at appropriate time for MEWS policy. Flowsheet filled out, yellow mews paper hung on door.

## 2020-11-22 NOTE — Procedures (Signed)
Patient seen on Hemodialysis. BP (!) 117/57   Pulse 91   Temp 99.8 F (37.7 C) (Oral)   Resp 18   Ht '5\' 10"'$  (1.778 m)   Wt 121 kg   SpO2 93%   BMI 38.28 kg/m   QB 400, UF goal 2L Tolerating treatment without problems at this time.  Elmarie Shiley MD [facility]. Office # [phone] Pager # [phone] 10:20 AM

## 2020-11-22 NOTE — Progress Notes (Signed)
PROGRESS NOTE    Tom Scott  [id number] DOB: [birth year] DOA: 11/08/2020 PCP: Patient, No Pcp Per (Inactive)   Brief Narrative: Tom Scott is a 65 y.o. male with a history of atrial fibrillation, hypertension, diabetes mellitus, hyperlipidemia initially presented to [facility] secondary to chest pain and epigastric pain.  He developed septic shock requiring pressors.  Reported to have cholecystitis with choledocholithiasis and possible need for ERCP.  Also developed A. fib with RVR.  He was transferred to [facility] for ERCP and due to septic shock requiring vasopressor support.  He was admitted to the ICU.  General surgery and gastroenterology were consulted for management of his acute calculus cholecystitis.  IR consulted for percutaneous cholecystostomy and patient managed with empiric antibiotics.  While admitted, patient developed ATN requiring initiation of hemodialysis.  Also while admitted, patient was found to have evidence of ESBL E. coli bacteremia from outside hospital report.  Patient's antibiotics have been transitioned to meropenem to treat his bacteremia and cholecystitis.   Assessment & Plan:   Principal Problem:   Septic shock ([place]) Active Problems:   Pressure injury of skin   Protein-calorie malnutrition, severe   Encounter for central line placement   AKI (acute kidney injury) ([place])   RUQ abdominal pain   Biliary sepsis   Elevated liver function tests   Septic shock Present on admission.  Secondary to cholecystitis and bacteremia. Patient required ICU admission with vasopressor support. Weaned off vasopressors.  Septic shock resolved.  Acute calculus cholecystitis General surgery and IR consulted. IR placed percutaneous cholecystostomy on 5/20.  LFTs have almost normalized. Fluid culture significant for E. Coli, in addition to rare bacteroides fragilis.  IR following regarding percutaneous drain, plan drain injection and possible  repositioning/exchange of percutaneous cholecystostomy tube which patient declined today and is rescheduled for 6/3.  GI was asked to reevaluate patient on 5/29 due to rising LFTs again, felt to be secondary to cholecystitis +/- amiodarone.  GI has since signed off on 5/31.  ID following and remains on meropenem.  ESBL E. Coli bacteremia Report received from OSH per PCCM that reports ESBL bacteremia and patient started on meropenem IV. Repeat blood culture (5/29) with no growth to date.  Developed fever of 101.2 overnight 5/31.  Mild leukocytosis/12.6.  ID follow-up appreciated.  RUE venous Doppler negative for DVT or SVT.  Recommend continuing meropenem to complete 14-day course and drain management per IR.  Also recommend removing left IJ CVC if no longer required which could be contributing to fever.  Discussed with RN, will attempt to find a peripheral IV access (may be difficult since patient still is edematous and swollen) and if able to find, will remove central line.  No high fevers in the last 24 hours.  Acute metabolic encephalopathy Secondary to uremia. Resolved.  Acute kidney injury Secondary to ATN secondary to septic shock. Patient started on hemodialysis on 5/23.  Scant to almost no urine output.  No signs of renal recovery.  Ongoing HD, underwent 6/2.  Nephrology following.  Atrial fibrillation with RVR Patient started on amiodarone for control. Currently rate controlled. On Eliquis for stroke prevention.  Rate controlled.  Asymptomatic anemia Patient with unknown baseline. Hemoglobin of 11 on admission with slow downtrend. No obvious source of bleeding. -Hemoglobin 7 over the last 3 days.  On oral iron and s/p Aranesp.  Likely related to critical illness.  S/p 2 units PRBC across HD on 6/2.  Anasarca Secondary to fluid resuscitation with IV  fluids in setting of kidney failure in addition to albuminemia. Current I/O of +17.4L.  Volume management across HD.  Essential  hypertension Patient is on amlodipine, losartan and metoprolol as an outpatient. Antihypertensives held secondary to hypotension and septic shock. Blood pressure now better controlled and stable. Could gradually start home regimen if blood pressure start to worsen.  Diabetes mellitus, type 2 Patient is on metformin as an outpatient.  Reasonable inpatient control on SSI.  Hyperlipidemia -Continue Crestor  Severe malnutrition History of gastric bypass. Worse secondary to critical illness. NG tube inserted on 5/25 and patient started on tube feeds on 5/25. NG tube removed by patient on 5/28. -Oral intake -Dietitian recommendations:  Trial Boost Plus chocolate TID- Each supplement provides 360kcal and 14g protein.    30 ml ProSource Plus BID, each supplement provides 100 kcals and 15 grams protein.   MVI with minerals BID per tube  500 mg calcium carbonate TID per tube  Supplement Vitamin C 500 mg BID   Pressure injury Medial coccyx, not POA  Diarrhea Likely multifactorial due to gallbladder disease and antibiotics.  Communicated with ID and okay to use Imodium, initiated trial.  Improved on Imodium.  Hypokalemia Likely due to GI losses and dialysis.  Management per nephrology.  Right upper extremity pain and swelling ?  Related to asymmetric edema complicating underlying arthritis.  Venous Doppler negative for DVT or SVT.  Elevate and monitor.   DVT prophylaxis: Eliquis Code Status:   Code Status: Full Code Family Communication: Discussed in detail with patient spouse at bedside 6/2, updated care and answered all questions Disposition Plan: Discharge likely in several days pending management of AKI vs need for outpatient HD, therapy recommendations   Consultants:   PCCM  Gastroenterology  General surgery  Interventional radiology  Nephrology  Procedures:   As noted above.  Antimicrobials:  Flagyl  Cefepime  Meropenem    Subjective: Patient seen this  morning at HD.  Feels better.  No BM overnight after dose of Imodium.  No other complaints reported  Objective: Vitals:   11/22/20 1110 11/22/20 1134 11/22/20 1200 11/22/20 1239  BP: 132/65 117/66 129/71 128/71  Pulse: (!) 104 97 (!) 101 (!) 112  Resp: '16 18  20  '$ Temp: 99.4 F (37.4 C) 99.1 F (37.3 C)  98.5 F (36.9 C)  TempSrc: Oral Oral  Oral  SpO2:    93%  Weight:    118.8 kg  Height:        Intake/Output Summary (Last 24 hours) at 11/22/2020 1736 Last data filed at 11/22/2020 1239 Gross per 24 hour  Intake 245 ml  Output 2370 ml  Net -2125 ml   Filed Weights   11/22/20 0500 11/22/20 0905 11/22/20 1239  Weight: 118.8 kg 121 kg 118.8 kg    Examination:  General exam: Middle-age male, moderately built and obese lying comfortably propped up in bed undergoing HD.  Looks improved compared to yesterday. Respiratory system: Clear to auscultation.  No increased work of breathing. Cardiovascular system: S1 and S2 heard, irregularly irregular.  No JVD or murmurs.  1+ pitting bilateral ankle edema.  Telemetry personally reviewed: A. fib with controlled ventricular rate. Gastrointestinal system: Abdomen is nondistended, soft and nontender. No organomegaly or masses felt. Normal bowel sounds heard. Central nervous system: Alert and oriented. No focal neurological deficits. Extremities: Symmetric 5 x 5 power.  However overall appears weak and deconditioned. Skin: No rashes, lesions or ulcers Psychiatry: Judgement and insight appear normal. Mood & affect flat.  Data Reviewed: I have personally reviewed following labs and imaging studies  CBC Lab Results  Component Value Date   WBC 12.0 (H) 11/22/2020   RBC 2.65 (L) 11/22/2020   HGB 7.0 (L) 11/22/2020   HCT 21.6 (L) 11/22/2020   MCV 81.5 11/22/2020   MCH 26.4 11/22/2020   PLT 379 11/22/2020   MCHC 32.4 11/22/2020   RDW 21.8 (H) 99991111     Last metabolic panel Lab Results  Component Value Date   NA 129 (L)  11/22/2020   K 3.1 (L) 11/22/2020   CL 95 (L) 11/22/2020   CO2 22 11/22/2020   BUN 54 (H) 11/22/2020   CREATININE 5.65 (H) 11/22/2020   GLUCOSE 160 (H) 11/22/2020   GFRNONAA 10 (L) 11/22/2020   CALCIUM 7.9 (L) 11/22/2020   PHOS 6.4 (H) 11/22/2020   PROT 5.5 (L) 11/21/2020   ALBUMIN 1.7 (L) 11/22/2020   BILITOT 1.3 (H) 11/21/2020   ALKPHOS 139 (H) 11/21/2020   AST 48 (H) 11/21/2020   ALT 35 11/21/2020   ANIONGAP 12 11/22/2020    CBG (last 3)  Recent Labs    11/22/20 0735 11/22/20 1319 11/22/20 1640  GLUCAP 155* 120* 189*     GFR: Estimated Creatinine Clearance: 16.4 mL/min (A) (by C-G formula based on SCr of 5.65 mg/dL (H)).  Coagulation Profile: Recent Labs  Lab 11/19/20 0327  INR 1.8*    Recent Results (from the past 240 hour(s))  Culture, blood (routine x 2)     Status: None (Preliminary result)   Collection Time: 11/18/20  4:57 PM   Specimen: BLOOD RIGHT HAND  Result Value Ref Range Status   Specimen Description BLOOD RIGHT HAND  Final   Special Requests   Final    BOTTLES DRAWN AEROBIC AND ANAEROBIC Blood Culture adequate volume   Culture   Final    NO GROWTH 4 DAYS Performed at [facility], [place] [address], [place], [place] [address]    Report Status PENDING  Incomplete  Culture, blood (routine x 2)     Status: None (Preliminary result)   Collection Time: 11/18/20  4:59 PM   Specimen: BLOOD LEFT HAND  Result Value Ref Range Status   Specimen Description BLOOD LEFT HAND  Final   Special Requests   Final    BOTTLES DRAWN AEROBIC AND ANAEROBIC Blood Culture adequate volume   Culture   Final    NO GROWTH 4 DAYS Performed at [facility], [place] [address], [place], [place] [address]    Report Status PENDING  Incomplete        Radiology Studies: VAS [place] UPPER EXTREMITY VENOUS DUPLEX  Result Date: 11/21/2020 UPPER VENOUS STUDY  Patient Name:  Tom Scott  Date of Exam:   11/21/2020 Medical Rec #: [id number]      Accession #:    [id number]  Date of Birth: 1955-10-25      Patient Gender: M Patient Age:   65Y Exam Location:  [facility] Procedure:      VAS [place] UPPER EXTREMITY VENOUS DUPLEX Referring Phys: [id number] RALPH A NETTEY --------------------------------------------------------------------------------  Indications: Pain, and Swelling Comparison Study: none Performing Technologist: June Leap RDMS, RVT  Examination Guidelines: A complete evaluation includes B-mode imaging, spectral Doppler, color Doppler, and power Doppler as needed of all accessible portions of each vessel. Bilateral testing is considered an integral part of a complete examination. Limited examinations for reoccurring indications may be performed as noted.  Right Findings: +----------+------------+---------+-----------+----------+-------+ RIGHT  CompressiblePhasicitySpontaneousPropertiesSummary +----------+------------+---------+-----------+----------+-------+ IJV           Full       Yes       Yes                      +----------+------------+---------+-----------+----------+-------+ Subclavian    Full       Yes       Yes                      +----------+------------+---------+-----------+----------+-------+ Axillary      Full       Yes       Yes                      +----------+------------+---------+-----------+----------+-------+ Brachial      Full                                          +----------+------------+---------+-----------+----------+-------+ Radial        Full                                          +----------+------------+---------+-----------+----------+-------+ Ulnar         Full                                          +----------+------------+---------+-----------+----------+-------+ Cephalic      Full                                          +----------+------------+---------+-----------+----------+-------+ Basilic       Full                                           +----------+------------+---------+-----------+----------+-------+  Left Findings: +----------+------------+---------+-----------+----------+-------+ LEFT      CompressiblePhasicitySpontaneousPropertiesSummary +----------+------------+---------+-----------+----------+-------+ Subclavian               Yes       Yes                      +----------+------------+---------+-----------+----------+-------+  Summary:  Right: No evidence of deep vein thrombosis in the upper extremity. No evidence of superficial vein thrombosis in the upper extremity.  Left: No evidence of thrombosis in the subclavian.  *See table(s) above for measurements and observations.  Diagnosing physician: Harold Barban MD Electronically signed by Harold Barban MD on 11/21/2020 at 10:15:03 PM.    Final         Scheduled Meds: . (feeding supplement) PROSource Plus  30 mL Oral BID BM  . sodium chloride   Intravenous Once  . amiodarone  200 mg Oral Daily  . apixaban  5 mg Oral BID  . vitamin C  500 mg Oral BID  . calcium carbonate  1 tablet Oral TID  . Chlorhexidine Gluconate Cloth  6 each Topical Q0600  . cholecalciferol  1,000 Units Oral Daily  . darbepoetin (ARANESP) injection - NON-DIALYSIS  40 mcg Subcutaneous Q Sat-1800  .  insulin aspart  0-6 Units Subcutaneous TID WC  . iron polysaccharides  150 mg Oral Daily  . lactose free nutrition  237 mL Oral TID WC  . lidocaine  1 patch Transdermal Q24H  . multivitamin with minerals  1 tablet Oral BID  . rosuvastatin  10 mg Oral Daily  . sodium chloride flush  10-40 mL Intracatheter Q12H  . sodium chloride flush  5 mL Intracatheter Q8H  . zinc sulfate  220 mg Oral Daily   Continuous Infusions: . sodium chloride    . meropenem (MERREM) IV 500 mg (11/22/20 1330)     LOS: 14 days    [name], MD, [place], [facility]. [facility]  To contact the attending provider between 7A-7P or the covering provider during after hours 7P-7A, please log into the web site  [email or web address] and access using universal [place] password for that web site. If you do not have the password, please call the hospital operator.

## 2020-11-22 NOTE — Progress Notes (Addendum)
Patient ID: Tom Scott, male   DOB: Nov 07, 1955, 65 y.o.   MRN: [id number] [facility] Progress Note   Assessment/ Plan:   1. Acute kidney Injury: Secondary to septic shock/ATN and likely exacerbated by hemodynamic insult from atrial fibrillation with RVR.  Started on hemodialysis on 5/23 and without any evidence of renal recovery so far- UOP remains scant. Ongoing hemodialysis today for management of azotemia/volume and will get PRBCs.  2.  Calculus cholecystitis with E. coli bacteremia: Status post percutaneous cholecystostomy on 5/20 with suspected amiodarone associated elevation of LFTs. 3.  Atrial fibrillation with rapid ventricular response: With intermittent tachycardia noted on oral amiodarone. 4.  Microcytic anemia: On oral iron and status post Aranesp-likely compounded by acute/critical illness. Will get 2 units PRBCs at dialysis today per discussion yesterday with Dr.Hongalgi. 5.  Physical deconditioning: Secondary to acute illness and lower extremity edema-awaiting PT/OT. 6. Hypokalemia: predominantly from diarrheal losses and in part from dialysis. Will replace via oral route.  Subjective:   Reports to be feeling fatigued this morning.    Objective:   BP (!) 117/57   Pulse 91   Temp 99.8 F (37.7 C) (Oral)   Resp 18   Ht '5\' 10"'$  (1.778 m)   Wt 121 kg   SpO2 93%   BMI 38.28 kg/m   Intake/Output Summary (Last 24 hours) at 11/22/2020 1017 Last data filed at 11/22/2020 0900 Gross per 24 hour  Intake 485 ml  Output 370 ml  Net 115 ml   Weight change: -2.2 kg  Physical Exam: Gen: Appears to be fatigued, resting in dialysis CVS: Pulse irregularly irregular tachycardia, S1 and S2 normal. Resp: Anteriorly clear to auscultation, no rales/rhonchi.  Left IJ temporary dialysis catheter and right IJ TDC. Abd: Soft, obese, cholecystostomy tube in situ right upper quadrant Ext: 1-2+ pitting edema over lower extremities  Imaging: VAS [place] UPPER EXTREMITY VENOUS  DUPLEX  Result Date: 11/21/2020 UPPER VENOUS STUDY  Patient Name:  Tom Scott  Date of Exam:   11/21/2020 Medical Rec #: [id number]      Accession #:    [id number] Date of Birth: [birth year]      Patient Gender: M Patient Age:   65Y Exam Location:  [facility] Procedure:      VAS [place] UPPER EXTREMITY VENOUS DUPLEX Referring Phys: [id number] RALPH A NETTEY --------------------------------------------------------------------------------  Indications: Pain, and Swelling Comparison Study: none Performing Technologist: June Leap RDMS, RVT  Examination Guidelines: A complete evaluation includes B-mode imaging, spectral Doppler, color Doppler, and power Doppler as needed of all accessible portions of each vessel. Bilateral testing is considered an integral part of a complete examination. Limited examinations for reoccurring indications may be performed as noted.  Right Findings: +----------+------------+---------+-----------+----------+-------+ RIGHT     CompressiblePhasicitySpontaneousPropertiesSummary +----------+------------+---------+-----------+----------+-------+ IJV           Full       Yes       Yes                      +----------+------------+---------+-----------+----------+-------+ Subclavian    Full       Yes       Yes                      +----------+------------+---------+-----------+----------+-------+ Axillary      Full       Yes       Yes                      +----------+------------+---------+-----------+----------+-------+  Brachial      Full                                          +----------+------------+---------+-----------+----------+-------+ Radial        Full                                          +----------+------------+---------+-----------+----------+-------+ Ulnar         Full                                          +----------+------------+---------+-----------+----------+-------+ Cephalic      Full                                           +----------+------------+---------+-----------+----------+-------+ Basilic       Full                                          +----------+------------+---------+-----------+----------+-------+  Left Findings: +----------+------------+---------+-----------+----------+-------+ LEFT      CompressiblePhasicitySpontaneousPropertiesSummary +----------+------------+---------+-----------+----------+-------+ Subclavian               Yes       Yes                      +----------+------------+---------+-----------+----------+-------+  Summary:  Right: No evidence of deep vein thrombosis in the upper extremity. No evidence of superficial vein thrombosis in the upper extremity.  Left: No evidence of thrombosis in the subclavian.  *See table(s) above for measurements and observations.  Diagnosing physician: Harold Barban MD Electronically signed by Harold Barban MD on 11/21/2020 at 10:15:03 PM.    Final    Labs: BMET Recent Labs  Lab 11/16/20 0500 11/17/20 0310 11/18/20 0302 11/19/20 0327 11/20/20 0944 11/21/20 0245 11/22/20 0059  NA 135 133* 133* 132* 127* 130* 129*  K 2.9* 3.1* 3.4* 3.2* 3.1* 2.9* 3.1*  CL 103 102 98 98 96* 93* 95*  CO2 '24 22 25 22 '$ 19* 24 22  GLUCOSE 164* 197* 165* 161* 178* 160* 160*  BUN 50* 66* 47* 65* 81* 42* 54*  CREATININE 4.93* 5.90* 4.59* 6.04* 7.34* 4.78* 5.65*  CALCIUM 7.9* 7.8* 7.8* 8.0* 7.8* 8.1* 7.9*  PHOS 3.1 3.7 3.8 5.2*  --   --  6.4*   CBC Recent Labs  Lab 11/19/20 0327 11/20/20 0112 11/21/20 0245 11/22/20 0059  WBC 13.2* 13.8* 12.6* 12.0*  HGB 7.3* 7.0* 7.0* 7.0*  HCT 23.3* 21.6* 22.0* 21.6*  MCV 83.8 81.8 83.3 81.5  PLT 216 266 328 379    Medications:    . (feeding supplement) PROSource Plus  30 mL Oral BID BM  . sodium chloride   Intravenous Once  . amiodarone  200 mg Oral Daily  . apixaban  5 mg Oral BID  . vitamin C  500 mg Oral BID  . calcium carbonate  1 tablet Oral TID  . Chlorhexidine Gluconate Cloth   6 each Topical Q0600  . cholecalciferol  1,000  Units Oral Daily  . darbepoetin (ARANESP) injection - NON-DIALYSIS  40 mcg Subcutaneous Q Sat-1800  . insulin aspart  0-6 Units Subcutaneous TID WC  . iron polysaccharides  150 mg Oral Daily  . lactose free nutrition  237 mL Oral TID WC  . lidocaine  1 patch Transdermal Q24H  . multivitamin with minerals  1 tablet Oral BID  . rosuvastatin  10 mg Oral Daily  . sodium chloride flush  10-40 mL Intracatheter Q12H  . sodium chloride flush  5 mL Intracatheter Q8H  . zinc sulfate  220 mg Oral Daily   Elmarie Shiley, MD 11/22/2020, 10:17 AM

## 2020-11-22 NOTE — Progress Notes (Signed)
Patient taken via transport to HD. Report given before transport. 1000 meds given prior. Verified with HD that they are going to give blood during treatment. Post HD, patient will be going to IR for drain check/possible new drain placement. Wife signed blood consent with myself as a witness and was sent with patient.

## 2020-11-23 ENCOUNTER — Inpatient Hospital Stay (HOSPITAL_COMMUNITY): Payer: Medicare (Managed Care)

## 2020-11-23 DIAGNOSIS — A419 Sepsis, unspecified organism: Secondary | ICD-10-CM | POA: Diagnosis not present

## 2020-11-23 DIAGNOSIS — R6521 Severe sepsis with septic shock: Secondary | ICD-10-CM | POA: Diagnosis not present

## 2020-11-23 LAB — CBC
HCT: 24.9 % — ABNORMAL LOW (ref 39.0–52.0)
Hemoglobin: 8 g/dL — ABNORMAL LOW (ref 13.0–17.0)
MCH: 27.2 pg (ref 26.0–34.0)
MCHC: 32.1 g/dL (ref 30.0–36.0)
MCV: 84.7 fL (ref 80.0–100.0)
Platelets: 374 10*3/uL (ref 150–400)
RBC: 2.94 MIL/uL — ABNORMAL LOW (ref 4.22–5.81)
RDW: 21.3 % — ABNORMAL HIGH (ref 11.5–15.5)
WBC: 14.5 10*3/uL — ABNORMAL HIGH (ref 4.0–10.5)
nRBC: 0 % (ref 0.0–0.2)

## 2020-11-23 LAB — BPAM RBC
Blood Product Expiration Date: 202206262359
Blood Product Expiration Date: 202206262359
ISSUE DATE / TIME: 202206021016
ISSUE DATE / TIME: 202206021016
Unit Type and Rh: 6200
Unit Type and Rh: 6200

## 2020-11-23 LAB — TYPE AND SCREEN
ABO/RH(D): A POS
Antibody Screen: NEGATIVE
Unit division: 0
Unit division: 0

## 2020-11-23 LAB — GLUCOSE, CAPILLARY
Glucose-Capillary: 164 mg/dL — ABNORMAL HIGH (ref 70–99)
Glucose-Capillary: 174 mg/dL — ABNORMAL HIGH (ref 70–99)
Glucose-Capillary: 137 mg/dL — ABNORMAL HIGH (ref 70–99)
Glucose-Capillary: 168 mg/dL — ABNORMAL HIGH (ref 70–99)

## 2020-11-23 LAB — CULTURE, BLOOD (ROUTINE X 2)
Culture: NO GROWTH
Culture: NO GROWTH
Special Requests: ADEQUATE
Special Requests: ADEQUATE

## 2020-11-23 MED ORDER — DARBEPOETIN ALFA 40 MCG/0.4ML IJ SOSY
40.0000 ug | PREFILLED_SYRINGE | INTRAMUSCULAR | Status: DC
  Filled 2020-11-23 (×2): qty 0.4

## 2020-11-23 NOTE — Plan of Care (Signed)

## 2020-11-23 NOTE — Progress Notes (Signed)
Patient ID: Tom Scott, male   DOB: 1956-05-16, 65 y.o.   MRN: [id number] [facility] Progress Note   Assessment/ Plan:   1. Acute kidney Injury: Secondary to septic shock/ATN and likely exacerbated by hemodynamic insult from atrial fibrillation with RVR.  Started on hemodialysis on 5/23 and without any evidence of renal recovery so far- UOP remains scant.  I will order for hemodialysis again tomorrow. 2.  Calculus cholecystitis with E. coli bacteremia: Status post percutaneous cholecystostomy on 5/20 with suspected amiodarone associated elevation of LFTs.  Awaiting tube exchange today. 3.  Atrial fibrillation with rapid ventricular response: With intermittent tachycardia noted on oral amiodarone. 4.  Microcytic anemia: On oral iron and status post Aranesp-likely compounded by acute/critical illness.  Status post PRBC transfusion yesterday with dialysis. 5.  Physical deconditioning: Secondary to acute illness and lower extremity edema-awaiting PT/OT. 6. Hypokalemia: predominantly from diarrheal losses and in part from dialysis.  Replaced via oral route/adjusted with dialysate and will recheck labs again today to decide on need for additional potassium.  Subjective:   He had a low-grade fever this morning T-max 100.8 with mild tachycardia of 102.  He denies any focal pain and reports that he continues to feel weak/poorly.    Objective:   BP 128/68 (BP Location: Left Arm)   Pulse 100   Temp 99.5 F (37.5 C) (Oral)   Resp 20   Ht '5\' 10"'$  (1.778 m)   Wt 119.1 kg   SpO2 94%   BMI 37.67 kg/m   Intake/Output Summary (Last 24 hours) at 11/23/2020 1018 Last data filed at 11/23/2020 0100 Gross per 24 hour  Intake 50 ml  Output 2100 ml  Net -2050 ml   Weight change: 2.2 kg  Physical Exam: Gen: Resting comfortably in bed, wife at bedside CVS: Pulse irregularly irregular tachycardia, S1 and S2 normal. Resp: Anteriorly clear to auscultation, no rales/rhonchi.  Left IJ temporary  dialysis catheter and right IJ TDC. Abd: Soft, obese, cholecystostomy tube in situ right upper quadrant Ext: 1-2+ pitting edema over lower extremities  Imaging: VAS [place] UPPER EXTREMITY VENOUS DUPLEX  Result Date: 11/21/2020 UPPER VENOUS STUDY  Patient Name:  Tom Scott  Date of Exam:   11/21/2020 Medical Rec #: [id number]      Accession #:    [id number] Date of Birth: [birth year]      Patient Gender: M Patient Age:   65Y Exam Location:  [facility] Procedure:      VAS [place] UPPER EXTREMITY VENOUS DUPLEX Referring Phys: [id number] RALPH A NETTEY --------------------------------------------------------------------------------  Indications: Pain, and Swelling Comparison Study: none Performing Technologist: June Leap RDMS, RVT  Examination Guidelines: A complete evaluation includes B-mode imaging, spectral Doppler, color Doppler, and power Doppler as needed of all accessible portions of each vessel. Bilateral testing is considered an integral part of a complete examination. Limited examinations for reoccurring indications may be performed as noted.  Right Findings: +----------+------------+---------+-----------+----------+-------+ RIGHT     CompressiblePhasicitySpontaneousPropertiesSummary +----------+------------+---------+-----------+----------+-------+ IJV           Full       Yes       Yes                      +----------+------------+---------+-----------+----------+-------+ Subclavian    Full       Yes       Yes                      +----------+------------+---------+-----------+----------+-------+ Axillary  Full       Yes       Yes                      +----------+------------+---------+-----------+----------+-------+ Brachial      Full                                          +----------+------------+---------+-----------+----------+-------+ Radial        Full                                           +----------+------------+---------+-----------+----------+-------+ Ulnar         Full                                          +----------+------------+---------+-----------+----------+-------+ Cephalic      Full                                          +----------+------------+---------+-----------+----------+-------+ Basilic       Full                                          +----------+------------+---------+-----------+----------+-------+  Left Findings: +----------+------------+---------+-----------+----------+-------+ LEFT      CompressiblePhasicitySpontaneousPropertiesSummary +----------+------------+---------+-----------+----------+-------+ Subclavian               Yes       Yes                      +----------+------------+---------+-----------+----------+-------+  Summary:  Right: No evidence of deep vein thrombosis in the upper extremity. No evidence of superficial vein thrombosis in the upper extremity.  Left: No evidence of thrombosis in the subclavian.  *See table(s) above for measurements and observations.  Diagnosing physician: Harold Barban MD Electronically signed by Harold Barban MD on 11/21/2020 at 10:15:03 PM.    Final    Labs: BMET Recent Labs  Lab 11/17/20 0310 11/18/20 0302 11/19/20 0327 11/20/20 0944 11/21/20 0245 11/22/20 0059  NA 133* 133* 132* 127* 130* 129*  K 3.1* 3.4* 3.2* 3.1* 2.9* 3.1*  CL 102 98 98 96* 93* 95*  CO2 '22 25 22 '$ 19* 24 22  GLUCOSE 197* 165* 161* 178* 160* 160*  BUN 66* 47* 65* 81* 42* 54*  CREATININE 5.90* 4.59* 6.04* 7.34* 4.78* 5.65*  CALCIUM 7.8* 7.8* 8.0* 7.8* 8.1* 7.9*  PHOS 3.7 3.8 5.2*  --   --  6.4*   CBC Recent Labs  Lab 11/20/20 0112 11/21/20 0245 11/22/20 0059 11/23/20 0357  WBC 13.8* 12.6* 12.0* 14.5*  HGB 7.0* 7.0* 7.0* 8.0*  HCT 21.6* 22.0* 21.6* 24.9*  MCV 81.8 83.3 81.5 84.7  PLT 266 328 379 374    Medications:    . (feeding supplement) PROSource Plus  30 mL Oral BID BM  . amiodarone   200 mg Oral Daily  . apixaban  5 mg Oral BID  . vitamin C  500 mg Oral BID  . calcium carbonate  1  tablet Oral TID  . Chlorhexidine Gluconate Cloth  6 each Topical Q0600  . cholecalciferol  1,000 Units Oral Daily  . darbepoetin (ARANESP) injection - NON-DIALYSIS  40 mcg Subcutaneous Q Sat-1800  . insulin aspart  0-6 Units Subcutaneous TID WC  . iron polysaccharides  150 mg Oral Daily  . lactose free nutrition  237 mL Oral TID WC  . lidocaine  1 patch Transdermal Q24H  . multivitamin with minerals  1 tablet Oral BID  . rosuvastatin  10 mg Oral Daily  . sodium chloride flush  10-40 mL Intracatheter Q12H  . sodium chloride flush  5 mL Intracatheter Q8H  . zinc sulfate  220 mg Oral Daily   Elmarie Shiley, MD 11/23/2020, 10:18 AM

## 2020-11-23 NOTE — Progress Notes (Signed)
Physical Therapy Treatment Patient Details Name: Tom Scott MRN: [id number] DOB: [birth year] Today's Date: 11/23/2020    History of Present Illness Tom Scott is a 65 y.o. M presented to [facility] with c/o chest pain and epigastric pain with concern for choledocholithiasis.  He was transferred to [facility] on 11/08/20 for ERCP and due to septic shock requiring vasopressor support.  He was admitted to the ICU, s/p perc cholecystomy tube placement on 5/20 with Dr. Annamaria Boots. Hospital course complicated by AKI, s/p tunneled HD catheter placement with Dr. Serafina Royals on 5/30, evidence of ESBL E. coli bacteremia during hospitalization. PMH: Afib, HTN, DMII, HLD.    PT Comments    Pt received in supine, agreeable to therapy session with max encouragement and pt noted to be drowsy and self-limiting throughout session due to pain/fatigue but participatory with encouragement. Pt c/o severe L knee pain and L knee noted to be warmer to touch than R knee, RN/MD notified (pt with h/o L knee pain per spouse). Pt performed mostly AAROM UE/LE therapeutic exercises with fair tolerance after discussion on benefits of mobility, he would benefit from supine strengthening and pressure relief handouts next session. Will plan to progress seated tolerance next date, pt would benefit from premedication due to poor pain control. Pt continues to benefit from PT services to progress toward functional mobility goals. Continue to recommend SNF.  Follow Up Recommendations  SNF     Equipment Recommendations  Wheelchair cushion (measurements PT);Wheelchair (measurements PT);Hospital bed;3in1 (PT) (hoyer; need further assessement next venue)    Recommendations for Other Services      Precautions / Restrictions Precautions Precautions: Fall;Other (comment) Precaution Comments: non-tunneled HD catheter left neck Restrictions Weight Bearing Restrictions: No    Mobility  Bed Mobility Overal bed mobility: Needs  Assistance Bed Mobility: Sidelying to Sit     Supine to sit: Total assist;+2 for physical assistance;HOB elevated     General bed mobility comments: partial rolls (lifting each shoulder when donning clean gown) with mod/maxA, pt not agreeable to lifting hips to side; supine to long sit attempt with heavy +2 maxA and pt pulling up on bed rails, unable to reach full upright posture but able to partially lift shoulders from raised [facility]    Transfers                 General transfer comment: pt refusing EOB/OOB due to fatigue so instead focus on bed mobility and ROM/therex  Ambulation/Gait                 Stairs             Wheelchair Mobility    Modified Rankin (Stroke Patients Only)       Balance Overall balance assessment: Needs assistance Sitting-balance support: No upper extremity supported;Feet supported Sitting balance-Leahy Scale: Zero Sitting balance - Comments: heavy posterior lean, unable to reach upright posture in long sit       Standing balance comment: Deferred attempt this session due to pain and weakness                            Cognition Arousal/Alertness:  (Drowsy/tending to keep eyes closed) Behavior During Therapy: Flat affect Overall Cognitive Status: Impaired/Different from baseline Area of Impairment: Attention;Safety/judgement;Awareness;Problem solving                   Current Attention Level: Focused     Safety/Judgement: Decreased awareness of deficits Awareness: Intellectual  Problem Solving: Decreased initiation;Difficulty sequencing;Requires verbal cues;Requires tactile cues General Comments: pt with poor insight into need for functional mobility and poor pain tolerance, resistant to progressing mobility, had to try to overcome his objections but pt somewhat self-limiting due to pain. 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 and seemed interested in speaking with Clinical biochemist, RN notified.       Exercises Low Level/ICU Exercises Ankle Circles/Pumps: AROM;Both;20 reps;Supine Quad Sets: AAROM;Both;5 reps (very limited quad contraction on L, small contraction seen on R, pain limited) Hip ABduction/ADduction: AAROM;Right (x3 reps, pain limited); hip Adduction x10 reps pillow squeezes Heel Slides: AAROM;Right;5 reps;Supine Breathing Exercises:  (cues for pursed-lip breathing x5 reps a few times during session, poor carryover of instruction) Shoulder Flexion: AAROM;Both;10 reps;Supine Elbow Flexion: AROM;AAROM;Both;10 reps;Supine (some AROM but needs AA for improved ROM) Other Exercises Other Exercises: wrist flex/ext, gross grasp/finger extension x10 reps ea Other Exercises: Trunk "sit up" in bed.  Had pt use UE and pull up from elevated HOB but only able to tolerate single rep and unable to reach upright. Encouraged him to continue these attempts for core strengthening.    General Comments General comments (skin integrity, edema, etc.): HR elevated 100-115 bpm with supine exercises, short shallow breaths with pain pt coached on pursed-lip breathing but will need reinforcement      Pertinent Vitals/Pain Pain Assessment: Faces Faces Pain Scale: Hurts worst Pain Location: L knee>R knee and B shoulders (shoulder pain improved with ROM but L knee pain did not) Pain Descriptors / Indicators: Discomfort;Sharp;Tender;Grimacing;Guarding;Moaning Pain Intervention(s): Monitored during session;Repositioned;Patient requesting pain meds-RN notified           PT Goals (current goals can now be found in the care plan section) Acute Rehab PT Goals Patient Stated Goal: get back home PT Goal Formulation: With patient/family Time For Goal Achievement: 12/02/20 Potential to Achieve Goals: Good Progress towards PT goals: Progressing toward goals    Frequency    Min 2X/week      PT Plan Current plan remains appropriate    Co-evaluation PT/OT/SLP Co-Evaluation/Treatment: Yes Reason for  Co-Treatment: Complexity of the patient's impairments (multi-system involvement);Necessary to address cognition/behavior during functional activity;For patient/therapist safety;To address functional/ADL transfers PT goals addressed during session: Mobility/safety with mobility;Strengthening/ROM        AM-PAC PT "6 Clicks" Mobility   Outcome Measure  Help needed turning from your back to your side while in a flat bed without using bedrails?: Total Help needed moving from lying on your back to sitting on the side of a flat bed without using bedrails?: Total Help needed moving to and from a bed to a chair (including a wheelchair)?: Total Help needed standing up from a chair using your arms (e.g., wheelchair or bedside chair)?: Total Help needed to walk in hospital room?: Total Help needed climbing 3-5 steps with a railing? : Total 6 Click Score: 6    End of Session Equipment Utilized During Treatment: Other (comment) (bed transfer pads) Activity Tolerance: Patient limited by pain;Patient limited by lethargy Patient left: in bed;with call bell/phone within reach;with family/visitor present;with bed alarm set (bed in chair position, encouraged spouse to make sure staff helps him return to supine in an hour at most due to pedal edema) Nurse Communication: Mobility status;Need for lift equipment PT Visit Diagnosis: Other abnormalities of gait and mobility (R26.89);Muscle weakness (generalized) (M62.81);Pain Pain - Right/Left: Right Pain - part of body: Knee     Time: [id number] PT Time Calculation (min) (ACUTE ONLY): 36  min  Charges:  $Therapeutic Exercise: 8-22 mins                     Kimiyah Blick P., PTA Acute Rehabilitation Services Pager: [phone] Office: [place] 11/23/2020, 5:51 PM

## 2020-11-23 NOTE — Progress Notes (Signed)
IR.  History of acute cholecystitis s/p percutaneous cholecystostomy tube placement in IR 11/09/2020; was scheduled for image-guided cholangiogram with possible exchange/manipulation in IR yesterday, however (despite signing consent the day prior) patient refused procedure unless he spoke with his wife prior.  Unfortunately, unable to accommodate procedure today in IR secondary to IR schedule. Plan for possible image-guided cholangiogram with possible exchange/manipulation in IR tentatively for Monday 11/26/2020 pending IR schedule/discussion with wife.  Please call IR with questions/concerns.   Bea Graff Aniken Monestime, PA-C 11/23/2020, 2:27 PM

## 2020-11-23 NOTE — Progress Notes (Signed)
Occupational Therapy Treatment Patient Details Name: Tom Scott MRN: [id number] DOB: 10/06/1955 Today's Date: 11/23/2020    History of present illness Tom Scott is a 65 y.o. M presented to [facility] with c/o chest pain and epigastric pain with concern for choledocholithiasis.  He was transferred to [facility] on 11/08/20 for ERCP and due to septic shock requiring vasopressor support.  He was admitted to the ICU, s/p perc cholecystomy tube placement on 5/20 with Dr. Annamaria Boots. Hospital course complicated by AKI, s/p tunneled HD catheter placement with Dr. Serafina Royals on 5/30, evidence of ESBL E. coli bacteremia during hospitalization. PMH: Afib, HTN, DMII, HLD.   OT comments  Pt limited by increased pain this session. Focused on exercises to prepare pt for EOB activities next session, as well as addressing pain and offloading some pressure on his back and bottom. Pt required max verbal encouragement to participate this session due to pain and weakness. RN/MD were notified of increased pain and their locations. Most exercises performed this session were AAROM and pt tolerated them fairly well with encouragement. Pt will benefit from being premedicated prior to session due to pain levels. Acute OT will continue to follow to assist with progressing functional mobility and ADL performance.    Follow Up Recommendations  SNF    Equipment Recommendations  3 in 1 bedside commode;Other (comment)    Recommendations for Other Services      Precautions / Restrictions Precautions Precautions: Fall;Other (comment) Precaution Comments: non-tunneled HD catheter left neck Restrictions Weight Bearing Restrictions: No       Mobility Bed Mobility Overal bed mobility: Needs Assistance Bed Mobility: Supine to Sit     Supine to sit: Total assist;+2 for physical assistance;HOB elevated     General bed mobility comments: partial rolls (lifting each shoulder when donning clean gown) with mod/maxA, pt  not agreeable to lifting hips to side; supine to long sit attempt with heavy +2 maxA and pt pulling up on bed rails, unable to reach full upright posture but able to partially lift shoulders from raised [facility]    Transfers                 General transfer comment: pt refusing EOB/OOB due to fatigue so instead focus on bed mobility and ROM/therex    Balance Overall balance assessment: Needs assistance Sitting-balance support: No upper extremity supported;Feet supported Sitting balance-Leahy Scale: Zero Sitting balance - Comments: heavy posterior lean, unable to reach upright posture in long sit       Standing balance comment: Deferred attempt this session due to pain and weakness                           ADL either performed or assessed with clinical judgement   ADL Overall ADL's : Needs assistance/impaired Eating/Feeding: Set up;Bed level Eating/Feeding Details (indicate cue type and reason): Pt is able to feed himself once all containers have been opened. However wife reported todaythat she feeds himdue to him being too weak tofeed himself. Wife educated on importance of pt completing tasks he can for himself to assist with mobility and maintaining some independence.             Upper Body Dressing : Moderate assistance;Bed level Upper Body Dressing Details (indicate cue type and reason): Difficulty lifting arms due to pain and weakness to doff and don clean hospital gown.  General ADL Comments: Pt deferred OOB due to pain and fatigue     Vision       Perception     Praxis      Cognition Arousal/Alertness: Awake/alert Behavior During Therapy: Flat affect Overall Cognitive Status: Impaired/Different from baseline Area of Impairment: Attention;Safety/judgement;Awareness;Problem solving                   Current Attention Level: Focused     Safety/Judgement: Decreased awareness of deficits Awareness:  Intellectual Problem Solving: Decreased initiation;Difficulty sequencing;Requires verbal cues;Requires tactile cues General Comments: pt with poor insight into need for functional mobility and poor pain tolerance, resistant to progressing mobility, had to try to overcome his objections but pt somewhat self-limiting due to pain. 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 and seemed interested in speaking with Clinical biochemist, RN notified.        Exercises Exercises: Low Level/ICU Low Level/ICU Exercises Ankle Circles/Pumps: AROM;Both;20 reps;Supine Quad Sets: AAROM;Both;5 reps (Very limited effort, potentially due to pain) Hip ABduction/ADduction: AAROM;Right (pain limited x3) Heel Slides: AAROM;Right;5 reps;Supine Breathing Exercises:  (cues for pursed-lip breathing x5 reps a few times during session, poor carryover of instruction) Shoulder Flexion: AAROM;Both;10 reps;Supine Elbow Flexion: AROM;AAROM;Both;10 reps;Supine Other Exercises Other Exercises: wrist flex/ext, gross grasp/finger extension x10 reps ea Other Exercises: Trunk "sit up" in bed.  Had pt use UE and pull up from elevated HOB but only able to tolerate single rep and unable to reach upright. Encouraged him to continue these attempts for core strengthening.   Shoulder Instructions       General Comments HR elevated 100-115 bpm with supine exercises, short shallow breaths with pain pt coached on pursed-lip breathing but will need reinforcement    Pertinent Vitals/ Pain       Pain Assessment: Faces Faces Pain Scale: Hurts worst Pain Location: L knee>R knee and B shoulders (shoulder pain improved with ROM but L knee pain did not) Pain Descriptors / Indicators: Discomfort;Sharp;Tender;Grimacing;Guarding;Moaning Pain Intervention(s): Monitored during session;Repositioned;Patient requesting pain meds-RN notified  Home Living                                          Prior Functioning/Environment               Frequency  Min 2X/week        Progress Toward Goals  OT Goals(current goals can now be found in the care plan section)  Progress towards OT goals: Not progressing toward goals - comment  Acute Rehab OT Goals Patient Stated Goal: get back home OT Goal Formulation: With patient/family Time For Goal Achievement: 12/05/20 Potential to Achieve Goals: Fair ADL Goals Pt Will Perform Grooming: with min guard assist;sitting Pt Will Perform Upper Body Bathing: with min guard assist;sitting Pt Will Perform Upper Body Dressing: with min guard assist;sitting Additional ADL Goal #1: Pt will stand using Either a RW or Stedy for 1 min to prepare for standing ADL's.  Plan Discharge plan remains appropriate;Frequency remains appropriate    Co-evaluation    PT/OT/SLP Co-Evaluation/Treatment: Yes Reason for Co-Treatment: Complexity of the patient's impairments (multi-system involvement);Necessary to address cognition/behavior during functional activity;For patient/therapist safety;To address functional/ADL transfers PT goals addressed during session: Mobility/safety with mobility;Strengthening/ROM OT goals addressed during session: ADL's and self-care;Strengthening/ROM      AM-PAC OT "6 Clicks" Daily Activity     Outcome Measure   Help  from another person eating meals?: A Little Help from another person taking care of personal grooming?: A Little Help from another person toileting, which includes using toliet, bedpan, or urinal?: Total Help from another person bathing (including washing, rinsing, drying)?: Total Help from another person to put on and taking off regular upper body clothing?: A Lot Help from another person to put on and taking off regular lower body clothing?: Total 6 Click Score: 11    End of Session    OT Visit Diagnosis: Unsteadiness on feet (R26.81);Other abnormalities of gait and mobility (R26.89);Muscle weakness (generalized) (M62.81)   Activity Tolerance  Patient limited by pain   Patient Left in bed;with call bell/phone within reach;with bed alarm set;with family/visitor present   Nurse Communication Mobility status;Patient requests pain meds        Time: 1633-1710 OT Time Calculation (min): 37 min  Charges: OT General Charges $OT Visit: 1 Visit OT Treatments $Therapeutic Activity: 8-22 mins  Anthonymichael Munday H., OTR/L Acute Rehabilitation  [place] Elane Yolanda Bonine 11/23/2020, 6:52 PM

## 2020-11-23 NOTE — Consult Note (Signed)
Reason for Consult: Bilateral knee swelling and pain Referring Physician: Simcha Scott is an 65 y.o. male.  HPI: Tom Scott is a 65 year old gentleman with a complicated medical history with recent sepsis.  He has been in the hospital now for about 2 weeks and has been on broad-spectrum antibiotics.  It was noted earlier today that his knees are swollen and painful.  He notes that he does have a history of arthritis in his knees.  He thinks he has been treated for gout in the past as well.  I was consulted to try and aspirate his knees to determine the cause of his knee pain and swelling.  Past Medical History:  Diagnosis Date  . Atrial fibrillation ([place])   . Diabetes mellitus ([place])   . Hypertension     Past Surgical History:  Procedure Laterality Date  . GASTRIC BYPASS    . IR FLUORO GUIDE CV LINE RIGHT  11/19/2020  . IR PERC CHOLECYSTOSTOMY  11/09/2020  . IR US GUIDE VASC ACCESS RIGHT  11/19/2020    History reviewed. No pertinent family history.  Social History:  has no history on file for tobacco use, alcohol use, and drug use.  Allergies:  Allergies  Allergen Reactions  . Penicillamine     Other reaction(s): lips swell  . Penicillins     Other reaction(s): Swelling  Legacy System: CCA Onset Date: <blank> Substance Legacy/Cerner: penicillins / penicillins (Legacy value) Category: Drug Severity Legacy/Cerner: <blank> / Unknown Reaction(s): swelling Comments: <blank>  Legacy System: CCA Onset Date: <blank> Substance Legacy/Cerner: ampicillin / ampicillin (Legacy value) Category: Drug Severity Legacy/Cerner: <blank> / Unknown Reaction(s): swelling Comments: <blank>  Legacy System: CCA Onset Date: <blank> Substance Legacy/Cerner: ampicillin / ampicillin (Legacy value) Category: Drug Severity Legacy/Cerner: <blank> / Unknown Reaction(s): swelling Comments: <blank>  Legacy System: CCA Onset Date: <blank> Substance Legacy/Cerner: penicillins / penicillins  (Legacy value) Category: Drug Severity Legacy/Cerner: <blank> / Unknown Reaction(s): swelling Comments: <blank>     Medications: I have reviewed the patient's current medications.  Results for orders placed or performed during the hospital encounter of 11/08/20 (from the past 48 hour(s))  Glucose, capillary     Status: Abnormal   Collection Time: 11/21/20  8:39 PM  Result Value Ref Range   Glucose-Capillary 177 (H) 70 - 99 mg/dL    Comment: Glucose reference range applies only to samples taken after fasting for at least 8 hours.  CBC     Status: Abnormal   Collection Time: 11/22/20 12:59 AM  Result Value Ref Range   WBC 12.0 (H) 4.0 - 10.5 K/uL   RBC 2.65 (L) 4.22 - 5.81 MIL/uL   Hemoglobin 7.0 (L) 13.0 - 17.0 g/dL   HCT 21.6 (L) 39.0 - 52.0 %   MCV 81.5 80.0 - 100.0 fL   MCH 26.4 26.0 - 34.0 pg   MCHC 32.4 30.0 - 36.0 g/dL   RDW 21.8 (H) 11.5 - 15.5 %   Platelets 379 150 - 400 K/uL   nRBC 0.0 0.0 - 0.2 %    Comment: Performed at [facility] [address], [place], [place] [address]  Renal function panel     Status: Abnormal   Collection Time: 11/22/20 12:59 AM  Result Value Ref Range   Sodium 129 (L) 135 - 145 mmol/L   Potassium 3.1 (L) 3.5 - 5.1 mmol/L   Chloride 95 (L) 98 - 111 mmol/L   CO2 22 22 - 32 mmol/L   Glucose, Bld 160 (H) 70 -  99 mg/dL    Comment: Glucose reference range applies only to samples taken after fasting for at least 8 hours.   BUN 54 (H) 8 - 23 mg/dL   Creatinine, Ser 5.65 (H) 0.61 - 1.24 mg/dL   Calcium 7.9 (L) 8.9 - 10.3 mg/dL   Phosphorus 6.4 (H) 2.5 - 4.6 mg/dL   Albumin 1.7 (L) 3.5 - 5.0 g/dL   GFR, Estimated 10 (L) >60 mL/min    Comment: (NOTE) Calculated using the CKD-EPI Creatinine Equation (2021)    Anion gap 12 5 - 15    Comment: Performed at [facility] [address] [place], [place] [address]  C-reactive protein     Status: Abnormal   Collection Time: 11/22/20 12:59 AM  Result Value Ref Range   CRP 26.3 (H)  <1.0 mg/dL    Comment: Performed at [facility] [address], [place], [place] [address]  Sedimentation rate     Status: Abnormal   Collection Time: 11/22/20 12:59 AM  Result Value Ref Range   Sed Rate 94 (H) 0 - 16 mm/hr    Comment: Performed at [facility] [address], [place], [place] [address]  ABO/Rh     Status: None   Collection Time: 11/22/20 12:59 AM  Result Value Ref Range   ABO/RH(D)      A POS Performed at [facility] [address] [place], [place] [address]   Glucose, capillary     Status: Abnormal   Collection Time: 11/22/20  7:35 AM  Result Value Ref Range   Glucose-Capillary 155 (H) 70 - 99 mg/dL    Comment: Glucose reference range applies only to samples taken after fasting for at least 8 hours.  Prepare RBC (crossmatch)     Status: None   Collection Time: 11/22/20  9:05 AM  Result Value Ref Range   Order Confirmation      ORDER PROCESSED BY BLOOD BANK Performed at [facility], [place] [address] [place], [place] [address]   Type and screen [place]     Status: None   Collection Time: 11/22/20  9:05 AM  Result Value Ref Range   ABO/RH(D) A POS    Antibody Screen NEG    Sample Expiration 11/25/2020,2359    Unit Number [id number]    Blood Component Type RBC LR PHER1    Unit division 00    Status of Unit ISSUED,FINAL    Transfusion Status OK TO TRANSFUSE    Crossmatch Result      Compatible Performed at [facility], [place] [address] [place], [place] [address]    Unit Number [id number]    Blood Component Type RED CELLS,LR    Unit division 00    Status of Unit ISSUED,FINAL    Transfusion Status OK TO TRANSFUSE    Crossmatch Result Compatible   Glucose, capillary     Status: Abnormal   Collection Time: 11/22/20  1:19 PM  Result Value Ref Range   Glucose-Capillary 120 (H) 70 - 99 mg/dL    Comment: Glucose reference range applies only to samples taken after fasting for at least 8 hours.   Glucose, capillary     Status: Abnormal   Collection Time: 11/22/20  4:40 PM  Result Value Ref Range   Glucose-Capillary 189 (H) 70 - 99 mg/dL    Comment: Glucose reference range applies only to samples taken after fasting for at least 8 hours.  CBC     Status: Abnormal   Collection Time: 11/23/20  3:57 AM  Result Value Ref Range   WBC 14.5 (H) 4.0 - 10.5 K/uL   RBC 2.94 (L) 4.22 - 5.81 MIL/uL   Hemoglobin 8.0 (L) 13.0 - 17.0 g/dL   HCT 24.9 (L) 39.0 - 52.0 %   MCV 84.7 80.0 - 100.0 fL   MCH 27.2 26.0 - 34.0 pg   MCHC 32.1 30.0 - 36.0 g/dL   RDW 21.3 (H) 11.5 - 15.5 %   Platelets 374 150 - 400 K/uL   nRBC 0.0 0.0 - 0.2 %    Comment: Performed at [facility] [address] [place], [place] [address]  Glucose, capillary     Status: Abnormal   Collection Time: 11/23/20  8:20 AM  Result Value Ref Range   Glucose-Capillary 164 (H) 70 - 99 mg/dL    Comment: Glucose reference range applies only to samples taken after fasting for at least 8 hours.  Glucose, capillary     Status: Abnormal   Collection Time: 11/23/20 11:58 AM  Result Value Ref Range   Glucose-Capillary 174 (H) 70 - 99 mg/dL    Comment: Glucose reference range applies only to samples taken after fasting for at least 8 hours.  Glucose, capillary     Status: Abnormal   Collection Time: 11/23/20  5:37 PM  Result Value Ref Range   Glucose-Capillary 137 (H) 70 - 99 mg/dL    Comment: Glucose reference range applies only to samples taken after fasting for at least 8 hours.    [place] RT LOWER EXTREM LTD SOFT TISSUE NON VASCULAR  Result Date: 11/23/2020 CLINICAL DATA:  Bilateral knee pain x5 months with possible joint effusion. EXAM: ULTRASOUND BILATERAL LOWER EXTREMITY LIMITED TECHNIQUE: Ultrasound examination of the RIGHT and LEFT knees was performed in the area of clinical concern. COMPARISON:  None. FINDINGS: Joint Space: A 7.2 cm x 6.9 cm x 2.0 cm area of complex hypoechogenicity is seen along the anterior aspect of the  right knee. A similar appearing 5.7 cm x 2.0 cm x 5.6 cm area of complex hypoechogenicity is seen along the anterior aspect of the left knee. No abnormal flow is noted within these regions on color Doppler evaluation. Muscles: Normal. Tendons: Normal Other Soft Tissue Structures: Diffusely edematous soft tissues are noted bilaterally. IMPRESSION: 1. Bilateral cellulitis with additional findings that may represent large, bilateral complex knee effusions. MRI correlation is recommended, as soft tissue abscesses cannot be excluded. Electronically Signed   By: Virgina Norfolk M.D.   On: 11/23/2020 16:55   [place] LT LOWER EXTREM LTD SOFT TISSUE NON VASCULAR  Result Date: 11/23/2020 CLINICAL DATA:  Bilateral knee pain with possible joint effusions. EXAM: ULTRASOUND BILATERAL LOWER EXTREMITY LIMITED TECHNIQUE: Ultrasound examination of the RIGHT and LEFT knees was performed in the area of clinical concern. COMPARISON:  None. FINDINGS: Joint Space: A 7.2 cm x 6.9 cm x 2.0 cm area of complex hypoechogenicity is seen along the anterior aspect of the right knee. A similar appearing 5.7 cm x 2.0 cm x 5.6 cm area of complex hypoechogenicity is seen along the anterior aspect of the left knee. No abnormal flow is noted within these regions on color Doppler evaluation. Muscles: Normal. Tendons: Normal Other Soft Tissue Structures: Diffusely edematous soft tissues are noted bilaterally. IMPRESSION: 1. Bilateral cellulitis with additional findings that may represent large, bilateral complex knee effusions. MRI correlation is recommended, as soft tissue abscesses cannot be excluded. Electronically  Signed   By: Virgina Norfolk M.D.   On: 11/23/2020 16:56    Review of Systems Blood pressure 110/62, pulse 86, temperature 99.2 F (37.3 C), temperature source Oral, resp. rate 16, height '5\' 10"'$  (1.778 m), weight 119.1 kg, SpO2 94 %. Physical Exam Constitutional:      Appearance: He is obese.  HENT:     Head: Atraumatic.  Eyes:      Extraocular Movements: Extraocular movements intact.  Pulmonary:     Effort: Pulmonary effort is normal.  Musculoskeletal:     Comments: Bilateral lower extremities with diffuse swelling.  His knees are both swollen with moderate effusion.  There is no significant warmth or erythema.  He does have pain with range of motion of both knees.  He is distally grossly neurovascularly intact.  Neurological:     Mental Status: He is alert.     Assessment/Plan: Bilateral knee effusions  Procedure: The right knee was sterilely prepped with alcohol and through a superior lateral position an 18-gauge needle was introduced into the joint.  I was only able to aspirate about 5 cc of blood-tinged fluid.  The needle was withdrawn and a light sterile dressing was applied.  The left knee was then sterilely prepped with alcohol and through a superolateral position an 18-gauge needle was introduced into the joint.  Again I was only able to aspirate about 5 cc of pink-orange cloudy fluid.  The needle was withdrawn and a light sterile dressing was applied.  Bilateral knee aspirates were sent for cell count gram stain and culture. Hopefully this will help diagnose the cause of this patient's knee pain and swelling.  I also ordered x-rays of both knees to evaluate underlying arthritis.    Tom Scott 11/23/2020, 8:33 PM

## 2020-11-23 NOTE — Progress Notes (Addendum)
I have seen and examined the patient. I have personally reviewed the clinical findings, laboratory findings, microbiological data and imaging studies. The assessment and treatment plan was discussed with the  Advance Practice Provider, Mauricio Po  I agree with her/his recommendations except following additions/corrections.  Low grade fever yesterday RT Upper extremity swelling/pain has resolved  Bilateral knee joints swollen and tender to touch  Plan for possible image-guided cholangiogram with possible exchange/manipulation in IR tentatively for Monday 11/26/2020  Continue Meropenem as is. Complete 14 days course as previously planned. Will need to re-assess for further need of antibiotics depending upon cause of recent fevers and [place] knee findings   Pending removal of Left IJ  I have ordered for [place] of knees bilaterally  Monitor CBC and BMP on antibiotics   Dr Juleen China is on call this weekend  and will  follow up on the [place] knees. Otherwise, new ID team will follow up on Monday   Rosiland Oz, [facility]  Date of Admission:  11/08/2020     Total days of antibiotics 15         ASSESSMENT:  Tom Scott has continued low grade fevers. IR with plans for drain injection and possible reposition/exchange. He will need continued drainage for at least 4-6 weeks. Another potential nidus could be the central line in his left neck. Have spoke with nursing about attempting to get additional IV access to be able to remove the central line. Will continue with current dose of meropenem as there is a chance that we do not have source control.   PLAN:  1. Continue current dose of meropenem.  2. Drain evaluation and management per IR. 3. Remove IJ central line and place PIV.  Principal Problem:   Septic shock ([place]) Active Problems:   Pressure injury of skin   Protein-calorie malnutrition,  severe   Encounter for central line placement   AKI (acute kidney injury) ([place])   RUQ abdominal pain   Biliary sepsis   Elevated liver function tests   . (feeding supplement) PROSource Plus  30 mL Oral BID BM  . amiodarone  200 mg Oral Daily  . apixaban  5 mg Oral BID  . vitamin C  500 mg Oral BID  . calcium carbonate  1 tablet Oral TID  . Chlorhexidine Gluconate Cloth  6 each Topical Q0600  . cholecalciferol  1,000 Units Oral Daily  . [START ON 11/24/2020] darbepoetin (ARANESP) injection - DIALYSIS  40 mcg Intravenous Q Sat-HD  . insulin aspart  0-6 Units Subcutaneous TID WC  . iron polysaccharides  150 mg Oral Daily  . lactose free nutrition  237 mL Oral TID WC  . lidocaine  1 patch Transdermal Q24H  . multivitamin with minerals  1 tablet Oral BID  . rosuvastatin  10 mg Oral Daily  . sodium chloride flush  10-40 mL Intracatheter Q12H  . sodium chloride flush  5 mL Intracatheter Q8H  . zinc sulfate  220 mg Oral Daily    SUBJECTIVE:  Tom Scott continues to have low grade fevers. Wife said he experienced confusion yesterday which appears better today. Diarrhea has improved.   Allergies  Allergen Reactions  . Penicillamine     Other reaction(s): lips swell  . Penicillins     Other reaction(s): Swelling  Legacy System: CCA Onset Date: <blank> Substance Legacy/Cerner: penicillins / penicillins (Legacy value) Category: Drug Severity Legacy/Cerner: <  blank> / Unknown Reaction(s): swelling Comments: <blank>  Legacy System: CCA Onset Date: <blank> Substance Legacy/Cerner: ampicillin / ampicillin (Legacy value) Category: Drug Severity Legacy/Cerner: <blank> / Unknown Reaction(s): swelling Comments: <blank>  Legacy System: CCA Onset Date: <blank> Substance Legacy/Cerner: ampicillin / ampicillin (Legacy value) Category: Drug Severity Legacy/Cerner: <blank> / Unknown Reaction(s): swelling Comments: <blank>  Legacy System: CCA Onset Date: <blank> Substance  Legacy/Cerner: penicillins / penicillins (Legacy value) Category: Drug Severity Legacy/Cerner: <blank> / Unknown Reaction(s): swelling Comments: <blank>      Review of Systems: Review of Systems  Constitutional: Negative for chills, fever and weight loss.  Respiratory: Negative for cough, shortness of breath and wheezing.   Cardiovascular: Negative for chest pain and leg swelling.  Gastrointestinal: Negative for abdominal pain, diarrhea, nausea and vomiting.  Skin: Negative for rash.    OBJECTIVE: Vitals:   11/22/20 2109 11/23/20 0100 11/23/20 0455 11/23/20 0456  BP: 119/60 118/60 128/68   Pulse: (!) 106 (!) 108 100   Resp: '20 19 20   '$ Temp: 98.8 F (37.1 C) 98.6 F (37 C) 99.5 F (37.5 C)   TempSrc: Oral Oral Oral   SpO2: 94% 96% 94%   Weight:    119.1 kg  Height:       Body mass index is 37.67 kg/m.  Physical Exam Constitutional:      General: He is not in acute distress.    Appearance: He is well-developed.     Comments: Lying in bed with head of bed elevated; pleasant; lethargic.   Cardiovascular:     Rate and Rhythm: Normal rate and regular rhythm.     Heart sounds: Normal heart sounds.  Pulmonary:     Effort: Pulmonary effort is normal.     Breath sounds: Normal breath sounds.  Abdominal:     General: There is no distension.     Tenderness: There is no abdominal tenderness.     Comments: Drain in place with serous appearing drainage.   Skin:    General: Skin is warm and dry.  Neurological:     Mental Status: He is alert and oriented to person, place, and time.     Lab Results Lab Results  Component Value Date   WBC 14.5 (H) 11/23/2020   HGB 8.0 (L) 11/23/2020   HCT 24.9 (L) 11/23/2020   MCV 84.7 11/23/2020   PLT 374 11/23/2020    Lab Results  Component Value Date   CREATININE 5.65 (H) 11/22/2020   BUN 54 (H) 11/22/2020   NA 129 (L) 11/22/2020   K 3.1 (L) 11/22/2020   CL 95 (L) 11/22/2020   CO2 22 11/22/2020    Lab Results  Component  Value Date   ALT 35 11/21/2020   AST 48 (H) 11/21/2020   ALKPHOS 139 (H) 11/21/2020   BILITOT 1.3 (H) 11/21/2020     Microbiology: Recent Results (from the past 240 hour(s))  Culture, blood (routine x 2)     Status: None   Collection Time: 11/18/20  4:57 PM   Specimen: BLOOD RIGHT HAND  Result Value Ref Range Status   Specimen Description BLOOD RIGHT HAND  Final   Special Requests   Final    BOTTLES DRAWN AEROBIC AND ANAEROBIC Blood Culture adequate volume   Culture   Final    NO GROWTH 5 DAYS Performed at [facility], [place] [address] [place], [place] [address]    Report Status 11/23/2020 FINAL  Final  Culture, blood (routine x 2)  Status: None   Collection Time: 11/18/20  4:59 PM   Specimen: BLOOD LEFT HAND  Result Value Ref Range Status   Specimen Description BLOOD LEFT HAND  Final   Special Requests   Final    BOTTLES DRAWN AEROBIC AND ANAEROBIC Blood Culture adequate volume   Culture   Final    NO GROWTH 5 DAYS Performed at [facility], [address] [place], [place] [address]    Report Status 11/23/2020 FINAL  Final     Terri Piedra, NP [facility]  11/23/2020  10:45 AM

## 2020-11-23 NOTE — Progress Notes (Signed)
PROGRESS NOTE    Tom Scott  [id number] DOB: May 20, 1956 DOA: 11/08/2020 PCP: Patient, No Pcp Per (Inactive)   Brief Narrative: Tom Scott is a 65 y.o. male with a history of atrial fibrillation, hypertension, diabetes mellitus, hyperlipidemia initially presented to [facility] secondary to chest pain and epigastric pain.  He developed septic shock requiring pressors.  Reported to have cholecystitis with choledocholithiasis and possible need for ERCP.  Also developed A. fib with RVR.  He was transferred to [facility] for ERCP and due to septic shock requiring vasopressor support.  He was admitted to the ICU.  General surgery and gastroenterology were consulted for management of his acute calculus cholecystitis.  IR consulted for percutaneous cholecystostomy and patient managed with empiric antibiotics.  While admitted, patient developed ATN requiring initiation of hemodialysis.  Also while admitted, patient was found to have evidence of ESBL E. coli bacteremia from outside hospital report.  Patient's antibiotics have been transitioned to meropenem to treat his bacteremia and cholecystitis. Ongoing low-grade fevers.   Assessment & Plan:   Principal Problem:   Septic shock ([place]) Active Problems:   Pressure injury of skin   Protein-calorie malnutrition, severe   Encounter for central line placement   AKI (acute kidney injury) ([place])   RUQ abdominal pain   Biliary sepsis   Elevated liver function tests   Septic shock Present on admission.  Secondary to cholecystitis and bacteremia. Patient required ICU admission with vasopressor support. Weaned off vasopressors.  Septic shock resolved.  Acute calculus cholecystitis General surgery and IR consulted. IR placed percutaneous cholecystostomy on 5/20.  LFTs have almost normalized. Fluid culture significant for E. Coli, in addition to rare bacteroides fragilis.  IR following regarding percutaneous drain, plan drain  injection and possible repositioning/exchange of percutaneous cholecystostomy tube which patient declined on 6/2, IR schedule packed 6/3, and is rescheduled for 6/6.  GI was asked to reevaluate patient on 5/29 due to rising LFTs again, felt to be secondary to cholecystitis +/- amiodarone.  GI has since signed off on 5/31.  ID following and remains on meropenem.  ESBL E. Coli bacteremia Report received from OSH per PCCM that reports ESBL bacteremia and patient started on meropenem IV. Repeat blood culture (5/29) with no growth to date.  Developed fever of 101.2 overnight 5/31.  Mild leukocytosis/12.6.  ID follow-up appreciated.  RUE venous Doppler negative for DVT or SVT.  Recommend continuing meropenem to complete 14-day course and drain management per IR.  Also recommend removing left IJ CVC if no longer required which could be contributing to fever.  Discussed with RN yesterday and today, will attempt to find a peripheral IV access (may be difficult since patient still is edematous and swollen) and if able to find, will remove central line.  Intermittent low-grade fevers.  ID has ordered ultrasound of both knees due to swelling and tenderness.  Acute metabolic encephalopathy Secondary to uremia. Resolved.  Acute kidney injury Secondary to ATN secondary to septic shock. Patient started on hemodialysis on 5/23.  Scant to almost no urine output.  No signs of renal recovery.  Nephrology following and undergoing intermittent HD.  Atrial fibrillation with RVR Patient started on amiodarone for control. Currently rate controlled. On Eliquis for stroke prevention.  Rate controlled.  Asymptomatic anemia Patient with unknown baseline. Hemoglobin of 11 on admission with slow downtrend. No obvious source of bleeding. - On oral iron and s/p Aranesp.  Likely related to critical illness.  S/p 2 units PRBC across  HD on 6/2.  Hemoglobin up from 7 g to 8 g per DL.  Anasarca Secondary to fluid resuscitation with IV  fluids in setting of kidney failure in addition to albuminemia. Current I/O of +17.4L.  Volume management across HD.  Improving.  Essential hypertension Patient is on amlodipine, losartan and metoprolol as an outpatient. Antihypertensives held secondary to hypotension and septic shock. Blood pressure now better controlled and stable. Could gradually start home regimen if blood pressure start to worsen.  Diabetes mellitus, type 2 Patient is on metformin as an outpatient.  Reasonable inpatient control on SSI.  Hyperlipidemia -Continue Crestor  Severe malnutrition History of gastric bypass. Worse secondary to critical illness. NG tube inserted on 5/25 and patient started on tube feeds on 5/25. NG tube removed by patient on 5/28. -Oral intake -Dietitian recommendations:  Trial Boost Plus chocolate TID- Each supplement provides 360kcal and 14g protein.    30 ml ProSource Plus BID, each supplement provides 100 kcals and 15 grams protein.   MVI with minerals BID per tube  500 mg calcium carbonate TID per tube  Supplement Vitamin C 500 mg BID   Pressure injury Medial coccyx, not POA  Diarrhea Likely multifactorial due to gallbladder disease and antibiotics.  Communicated with ID and okay to use Imodium, initiated trial.  Improved  Hypokalemia Likely due to GI losses and dialysis.  Management per nephrology.  Right upper extremity pain and swelling ?  Related to asymmetric edema complicating underlying arthritis.  Venous Doppler negative for DVT or SVT.  Elevate and monitor.  Improved.   DVT prophylaxis: Eliquis Code Status:   Code Status: Full Code Family Communication: Discussed in detail with patient spouse at bedside 6/3, updated care and answered all questions.  Advised her that patient declined drain exchange yesterday. Disposition Plan: Discharge likely in several days pending management of AKI vs need for outpatient HD, therapy recommendations   Consultants:    PCCM  Gastroenterology  General surgery  Interventional radiology  Nephrology  Procedures:   As noted above.  Antimicrobials:  Flagyl  Cefepime  Meropenem    Subjective: Seen this morning.  Spouse at bedside.  Diarrhea has improved.  Was able to sleep through the night.  At times abdominal gaseous discomfort, passing flatus.  Cannot tell why he declined the drain exchange yesterday.  Seems to be agreeable in the presence of his wife.  Objective: Vitals:   11/23/20 0100 11/23/20 0455 11/23/20 0456 11/23/20 1437  BP: 118/60 128/68  (!) 94/51  Pulse: (!) 108 100  99  Resp: '19 20  20  '$ Temp: 98.6 F (37 C) 99.5 F (37.5 C)  99.5 F (37.5 C)  TempSrc: Oral Oral  Oral  SpO2: 96% 94%  97%  Weight:   119.1 kg   Height:        Intake/Output Summary (Last 24 hours) at 11/23/2020 1505 Last data filed at 11/23/2020 0100 Gross per 24 hour  Intake 50 ml  Output 100 ml  Net -50 ml   Filed Weights   11/22/20 0905 11/22/20 1239 11/23/20 0456  Weight: 121 kg 118.8 kg 119.1 kg    Examination:  General exam: Middle-age male, moderately built and obese lying comfortably propped up in bed. Respiratory system: Clear to auscultation.  No increased work of breathing. Cardiovascular system: S1 and S2 heard, irregularly irregular.  No JVD or murmurs.  Trace bilateral ankle edema.  Right upper extremity swelling improved.  Telemetry personally reviewed: A. fib with controlled ventricular rate. Gastrointestinal  system: Abdomen is nondistended, soft and nontender. No organomegaly or masses felt. Normal bowel sounds heard. Central nervous system: Alert and oriented. No focal neurological deficits. Extremities: Symmetric 5 x 5 power.  However overall appears weak and deconditioned. Skin: No rashes, lesions or ulcers Psychiatry: Judgement and insight appear normal. Mood & affect flat.       Data Reviewed: I have personally reviewed following labs and imaging studies  CBC Lab  Results  Component Value Date   WBC 14.5 (H) 11/23/2020   RBC 2.94 (L) 11/23/2020   HGB 8.0 (L) 11/23/2020   HCT 24.9 (L) 11/23/2020   MCV 84.7 11/23/2020   MCH 27.2 11/23/2020   PLT 374 11/23/2020   MCHC 32.1 11/23/2020   RDW 21.3 (H) [id number]     Last metabolic panel Lab Results  Component Value Date   NA 129 (L) 11/22/2020   K 3.1 (L) 11/22/2020   CL 95 (L) 11/22/2020   CO2 22 11/22/2020   BUN 54 (H) 11/22/2020   CREATININE 5.65 (H) 11/22/2020   GLUCOSE 160 (H) 11/22/2020   GFRNONAA 10 (L) 11/22/2020   CALCIUM 7.9 (L) 11/22/2020   PHOS 6.4 (H) 11/22/2020   PROT 5.5 (L) 11/21/2020   ALBUMIN 1.7 (L) 11/22/2020   BILITOT 1.3 (H) 11/21/2020   ALKPHOS 139 (H) 11/21/2020   AST 48 (H) 11/21/2020   ALT 35 11/21/2020   ANIONGAP 12 11/22/2020    CBG (last 3)  Recent Labs    11/22/20 1640 11/23/20 0820 11/23/20 1158  GLUCAP 189* 164* 174*     GFR: Estimated Creatinine Clearance: 16.4 mL/min (A) (by C-G formula based on SCr of 5.65 mg/dL (H)).  Coagulation Profile: Recent Labs  Lab 11/19/20 0327  INR 1.8*    Recent Results (from the past 240 hour(s))  Culture, blood (routine x 2)     Status: None   Collection Time: 11/18/20  4:57 PM   Specimen: BLOOD RIGHT HAND  Result Value Ref Range Status   Specimen Description BLOOD RIGHT HAND  Final   Special Requests   Final    BOTTLES DRAWN AEROBIC AND ANAEROBIC Blood Culture adequate volume   Culture   Final    NO GROWTH 5 DAYS Performed at [facility], [place] [address] [place], [place] [address]    Report Status 11/23/2020 FINAL  Final  Culture, blood (routine x 2)     Status: None   Collection Time: 11/18/20  4:59 PM   Specimen: BLOOD LEFT HAND  Result Value Ref Range Status   Specimen Description BLOOD LEFT HAND  Final   Special Requests   Final    BOTTLES DRAWN AEROBIC AND ANAEROBIC Blood Culture adequate volume   Culture   Final    NO GROWTH 5 DAYS Performed at [facility], [place]  [address] [place], [place] [address]    Report Status 11/23/2020 FINAL  Final        Radiology Studies: No results found.      Scheduled Meds: . (feeding supplement) PROSource Plus  30 mL Oral BID BM  . amiodarone  200 mg Oral Daily  . apixaban  5 mg Oral BID  . vitamin C  500 mg Oral BID  . calcium carbonate  1 tablet Oral TID  . Chlorhexidine Gluconate Cloth  6 each Topical Q0600  . cholecalciferol  1,000 Units Oral Daily  . [START ON 11/24/2020] darbepoetin (ARANESP) injection - DIALYSIS  40 mcg Intravenous Q Sat-HD  .  insulin aspart  0-6 Units Subcutaneous TID WC  . iron polysaccharides  150 mg Oral Daily  . lactose free nutrition  237 mL Oral TID WC  . lidocaine  1 patch Transdermal Q24H  . multivitamin with minerals  1 tablet Oral BID  . rosuvastatin  10 mg Oral Daily  . sodium chloride flush  10-40 mL Intracatheter Q12H  . sodium chloride flush  5 mL Intracatheter Q8H  . zinc sulfate  220 mg Oral Daily   Continuous Infusions: . sodium chloride       LOS: 15 days    [name], MD, [place], [facility]. [facility]  To contact the attending provider between 7A-7P or the covering provider during after hours 7P-7A, please log into the web site [email or web address] and access using universal [place] password for that web site. If you do not have the password, please call the hospital operator.

## 2020-11-23 NOTE — Progress Notes (Addendum)
Addendum  When I saw patient this morning, neither patient nor his wife reported knee pains.  Subsequently seen by ID at which time knee pain noticed.  ID ordered bilateral knee ultrasounds.  Report as below:  Bilateral cellulitis with additional findings that may represent large, bilateral complex knee effusions. MRI correlation is recommended, as soft tissue abscesses cannot be excluded.  He also reported that he is painful and tender in the left knee more than the right.  Unknown if he has history of gout.  Discussed with ID.  Avoiding steroids due to sepsis and ongoing fevers.  Avoiding NSAIDs or colchicine due to AKI.  Consulting orthopedics to try and aspirate both knees or at least the left knee for gram stain, culture and crystal evaluation.  Discussed with Dr. Tamera Punt, orthopedics who will evaluate patient this evening and likely aspirate both knees for evaluation.  [name], MD, [place], [facility]. [facility]  To contact the attending provider between 7A-7P or the covering provider during after hours 7P-7A, please log into the web site [email or web address] and access using universal [place] password for that web site. If you do not have the password, please call the hospital operator.

## 2020-11-24 ENCOUNTER — Inpatient Hospital Stay (HOSPITAL_COMMUNITY): Payer: Medicare (Managed Care) | Admitting: Certified Registered Nurse Anesthetist

## 2020-11-24 ENCOUNTER — Encounter (HOSPITAL_COMMUNITY): Payer: Self-pay | Admitting: Pulmonary Disease

## 2020-11-24 ENCOUNTER — Encounter (HOSPITAL_COMMUNITY)
Admission: AD | Disposition: A | Discharge status: Nursing Home (Includes ICF) | Payer: Self-pay | Source: Other Acute Inpatient Hospital | Attending: Emergency Medicine

## 2020-11-24 DIAGNOSIS — R7881 Bacteremia: Secondary | ICD-10-CM

## 2020-11-24 DIAGNOSIS — K8309 Other cholangitis: Secondary | ICD-10-CM | POA: Diagnosis not present

## 2020-11-24 DIAGNOSIS — M009 Pyogenic arthritis, unspecified: Secondary | ICD-10-CM | POA: Diagnosis not present

## 2020-11-24 DIAGNOSIS — N179 Acute kidney failure, unspecified: Secondary | ICD-10-CM | POA: Diagnosis not present

## 2020-11-24 HISTORY — PX: KNEE ARTHROSCOPY: SHX127

## 2020-11-24 LAB — BASIC METABOLIC PANEL
Anion gap: 13 (ref 5–15)
BUN: 56 mg/dL — ABNORMAL HIGH (ref 8–23)
CO2: 23 mmol/L (ref 22–32)
Calcium: 8.1 mg/dL — ABNORMAL LOW (ref 8.9–10.3)
Chloride: 96 mmol/L — ABNORMAL LOW (ref 98–111)
Creatinine, Ser: 5.7 mg/dL — ABNORMAL HIGH (ref 0.61–1.24)
GFR, Estimated: 10 mL/min — ABNORMAL LOW (ref >60)
Glucose, Bld: 149 mg/dL — ABNORMAL HIGH (ref 70–99)
Potassium: 3.1 mmol/L — ABNORMAL LOW (ref 3.5–5.1)
Sodium: 132 mmol/L — ABNORMAL LOW (ref 135–145)

## 2020-11-24 LAB — SYNOVIAL CELL COUNT + DIFF, W/ CRYSTALS
Crystals, Fluid: NONE SEEN
Eosinophils-Synovial: 0 % (ref 0–1)
Eosinophils-Synovial: 0 % (ref 0–1)
Lymphocytes-Synovial Fld: 15 % (ref 0–20)
Lymphocytes-Synovial Fld: 21 % — ABNORMAL HIGH (ref 0–20)
Monocyte-Macrophage-Synovial Fluid: 2 % — ABNORMAL LOW (ref 50–90)
Monocyte-Macrophage-Synovial Fluid: 6 % — ABNORMAL LOW (ref 50–90)
Neutrophil, Synovial: 73 % — ABNORMAL HIGH (ref 0–25)
Neutrophil, Synovial: 83 % — ABNORMAL HIGH (ref 0–25)
WBC, Synovial: 67 /mm3 (ref 0–200)
WBC, Synovial: 75 /mm3 (ref 0–200)

## 2020-11-24 LAB — CBC
HCT: 24.5 % — ABNORMAL LOW (ref 39.0–52.0)
HCT: 25 % — ABNORMAL LOW (ref 39.0–52.0)
Hemoglobin: 7.7 g/dL — ABNORMAL LOW (ref 13.0–17.0)
Hemoglobin: 7.9 g/dL — ABNORMAL LOW (ref 13.0–17.0)
MCH: 26.6 pg (ref 26.0–34.0)
MCH: 27.2 pg (ref 26.0–34.0)
MCHC: 31.4 g/dL (ref 30.0–36.0)
MCHC: 31.6 g/dL (ref 30.0–36.0)
MCV: 84.5 fL (ref 80.0–100.0)
MCV: 86.2 fL (ref 80.0–100.0)
Platelets: 402 10*3/uL — ABNORMAL HIGH (ref 150–400)
Platelets: 434 10*3/uL — ABNORMAL HIGH (ref 150–400)
RBC: 2.9 MIL/uL — ABNORMAL LOW (ref 4.22–5.81)
RBC: 2.9 MIL/uL — ABNORMAL LOW (ref 4.22–5.81)
RDW: 20.9 % — ABNORMAL HIGH (ref 11.5–15.5)
RDW: 20.9 % — ABNORMAL HIGH (ref 11.5–15.5)
WBC: 12.1 10*3/uL — ABNORMAL HIGH (ref 4.0–10.5)
WBC: 14.3 10*3/uL — ABNORMAL HIGH (ref 4.0–10.5)
nRBC: 0 % (ref 0.0–0.2)
nRBC: 0 % (ref 0.0–0.2)

## 2020-11-24 LAB — GLUCOSE, CAPILLARY
Glucose-Capillary: 121 mg/dL — ABNORMAL HIGH (ref 70–99)
Glucose-Capillary: 121 mg/dL — ABNORMAL HIGH (ref 70–99)
Glucose-Capillary: 160 mg/dL — ABNORMAL HIGH (ref 70–99)
Glucose-Capillary: 269 mg/dL — ABNORMAL HIGH (ref 70–99)

## 2020-11-24 LAB — RENAL FUNCTION PANEL
Albumin: 2 g/dL — ABNORMAL LOW (ref 3.5–5.0)
Anion gap: 13 (ref 5–15)
BUN: 41 mg/dL — ABNORMAL HIGH (ref 8–23)
CO2: 19 mmol/L — ABNORMAL LOW (ref 22–32)
Calcium: 8.1 mg/dL — ABNORMAL LOW (ref 8.9–10.3)
Chloride: 100 mmol/L (ref 98–111)
Creatinine, Ser: 4.16 mg/dL — ABNORMAL HIGH (ref 0.61–1.24)
GFR, Estimated: 15 mL/min — ABNORMAL LOW (ref >60)
Glucose, Bld: 270 mg/dL — ABNORMAL HIGH (ref 70–99)
Phosphorus: 5.9 mg/dL — ABNORMAL HIGH (ref 2.5–4.6)
Potassium: 4.7 mmol/L (ref 3.5–5.1)
Sodium: 132 mmol/L — ABNORMAL LOW (ref 135–145)

## 2020-11-24 LAB — URIC ACID: Uric Acid, Serum: 3.7 mg/dL (ref 3.7–8.6)

## 2020-11-24 SURGERY — ARTHROSCOPY, KNEE
Anesthesia: General | Site: Knee | Laterality: Bilateral

## 2020-11-24 MED ORDER — ORAL CARE MOUTH RINSE: 15.0000 mL | Freq: Once | OROMUCOSAL | Status: AC

## 2020-11-24 MED ORDER — PHENYLEPHRINE 40 MCG/ML (10ML) SYRINGE FOR IV PUSH (FOR BLOOD PRESSURE SUPPORT)
PREFILLED_SYRINGE | INTRAVENOUS | Status: AC
  Filled 2020-11-24: qty 10

## 2020-11-24 MED ORDER — ALBUMIN HUMAN 5 % IV SOLN
12.5000 g | Freq: Once | INTRAVENOUS | Status: AC
  Administered 2020-11-24: 12.5 g via INTRAVENOUS

## 2020-11-24 MED ORDER — SODIUM CHLORIDE 0.9 % IR SOLN
Status: DC | PRN
  Administered 2020-11-24: 9000 mL

## 2020-11-24 MED ORDER — DEXAMETHASONE SODIUM PHOSPHATE 10 MG/ML IJ SOLN
INTRAMUSCULAR | Status: AC
  Filled 2020-11-24: qty 1

## 2020-11-24 MED ORDER — MIDAZOLAM HCL 2 MG/2ML IJ SOLN
INTRAMUSCULAR | Status: AC
  Filled 2020-11-24: qty 2

## 2020-11-24 MED ORDER — METOCLOPRAMIDE HCL 5 MG/ML IJ SOLN: 5.0000 mg | Freq: Three times a day (TID) | INTRAMUSCULAR | Status: DC | PRN

## 2020-11-24 MED ORDER — ONDANSETRON HCL 4 MG/2ML IJ SOLN
INTRAMUSCULAR | Status: DC | PRN
  Administered 2020-11-24: 4 mg via INTRAVENOUS

## 2020-11-24 MED ORDER — CHLORHEXIDINE GLUCONATE 0.12 % MT SOLN
OROMUCOSAL | Status: AC
  Administered 2020-11-24: 15 mL via OROMUCOSAL
  Filled 2020-11-24: qty 15

## 2020-11-24 MED ORDER — HEPARIN SODIUM (PORCINE) 1000 UNIT/ML IJ SOLN
INTRAMUSCULAR | Status: AC
  Administered 2020-11-24: 1000 [IU]
  Filled 2020-11-24: qty 4

## 2020-11-24 MED ORDER — ACETAMINOPHEN 10 MG/ML IV SOLN
1000.0000 mg | Freq: Once | INTRAVENOUS | Status: DC | PRN
  Administered 2020-11-24: 1000 mg via INTRAVENOUS

## 2020-11-24 MED ORDER — ALBUMIN HUMAN 5 % IV SOLN
INTRAVENOUS | Status: AC
  Filled 2020-11-24: qty 250

## 2020-11-24 MED ORDER — DOCUSATE SODIUM 100 MG PO CAPS
100.0000 mg | ORAL_CAPSULE | Freq: Two times a day (BID) | ORAL | Status: DC
  Administered 2020-11-24 – 2020-12-01 (×13): 100 mg via ORAL
  Filled 2020-11-24 (×16): qty 1

## 2020-11-24 MED ORDER — LIDOCAINE 2% (20 MG/ML) 5 ML SYRINGE
INTRAMUSCULAR | Status: AC
  Filled 2020-11-24: qty 5

## 2020-11-24 MED ORDER — VANCOMYCIN HCL 10 G IV SOLR
2500.0000 mg | Freq: Once | INTRAVENOUS | Status: AC
  Administered 2020-11-24: 2500 mg via INTRAVENOUS
  Filled 2020-11-24: qty 2500

## 2020-11-24 MED ORDER — POVIDONE-IODINE 10 % EX SWAB: 2.0000 "application " | Freq: Once | CUTANEOUS | Status: DC

## 2020-11-24 MED ORDER — HEPARIN SODIUM (PORCINE) 1000 UNIT/ML DIALYSIS: 40.0000 [IU]/kg | INTRAMUSCULAR | Status: DC | PRN

## 2020-11-24 MED ORDER — PROPOFOL 10 MG/ML IV BOLUS
INTRAVENOUS | Status: DC | PRN
  Administered 2020-11-24: 80 mg via INTRAVENOUS

## 2020-11-24 MED ORDER — PHENYLEPHRINE 40 MCG/ML (10ML) SYRINGE FOR IV PUSH (FOR BLOOD PRESSURE SUPPORT)
PREFILLED_SYRINGE | INTRAVENOUS | Status: DC | PRN
  Administered 2020-11-24 (×5): 80 ug via INTRAVENOUS

## 2020-11-24 MED ORDER — DARBEPOETIN ALFA 40 MCG/0.4ML IJ SOSY
PREFILLED_SYRINGE | INTRAMUSCULAR | Status: AC
  Administered 2020-11-24: 40 ug
  Filled 2020-11-24: qty 0.4

## 2020-11-24 MED ORDER — HEPARIN SODIUM (PORCINE) 1000 UNIT/ML IJ SOLN
3800.0000 [IU] | Freq: Once | INTRAMUSCULAR | Status: AC
  Administered 2020-11-24: 3800 [IU]

## 2020-11-24 MED ORDER — ACETAMINOPHEN 10 MG/ML IV SOLN
INTRAVENOUS | Status: AC
  Filled 2020-11-24: qty 100

## 2020-11-24 MED ORDER — FENTANYL CITRATE (PF) 100 MCG/2ML IJ SOLN
INTRAMUSCULAR | Status: AC
  Filled 2020-11-24: qty 2

## 2020-11-24 MED ORDER — SODIUM CHLORIDE 0.9 % IR SOLN
Status: DC | PRN
  Administered 2020-11-24: 1000 mL

## 2020-11-24 MED ORDER — PHENYLEPHRINE HCL-NACL 10-0.9 MG/250ML-% IV SOLN
INTRAVENOUS | Status: DC | PRN
  Administered 2020-11-24: 25 ug/min via INTRAVENOUS

## 2020-11-24 MED ORDER — METOCLOPRAMIDE HCL 5 MG PO TABS: 5.0000 mg | ORAL_TABLET | Freq: Three times a day (TID) | ORAL | Status: DC | PRN

## 2020-11-24 MED ORDER — SODIUM CHLORIDE 0.9 % IV SOLN: INTRAVENOUS | Status: DC

## 2020-11-24 MED ORDER — ACETAMINOPHEN 160 MG/5ML PO SOLN: 325.0000 mg | ORAL | Status: DC | PRN

## 2020-11-24 MED ORDER — PROPOFOL 10 MG/ML IV BOLUS
INTRAVENOUS | Status: AC
  Filled 2020-11-24: qty 20

## 2020-11-24 MED ORDER — OXYCODONE HCL 5 MG PO TABS: 5.0000 mg | ORAL_TABLET | Freq: Once | ORAL | Status: DC | PRN

## 2020-11-24 MED ORDER — DEXAMETHASONE SODIUM PHOSPHATE 10 MG/ML IJ SOLN
INTRAMUSCULAR | Status: DC | PRN
  Administered 2020-11-24: 5 mg via INTRAVENOUS

## 2020-11-24 MED ORDER — PROMETHAZINE HCL 25 MG/ML IJ SOLN: 6.2500 mg | INTRAMUSCULAR | Status: DC | PRN

## 2020-11-24 MED ORDER — VANCOMYCIN VARIABLE DOSE PER UNSTABLE RENAL FUNCTION (PHARMACIST DOSING): Status: DC

## 2020-11-24 MED ORDER — ALBUMIN HUMAN 25 % IV SOLN
INTRAVENOUS | Status: AC
  Administered 2020-11-24: 25 g
  Filled 2020-11-24: qty 100

## 2020-11-24 MED ORDER — CHLORHEXIDINE GLUCONATE 0.12 % MT SOLN: 15.0000 mL | Freq: Once | OROMUCOSAL | Status: AC

## 2020-11-24 MED ORDER — FENTANYL CITRATE (PF) 250 MCG/5ML IJ SOLN
INTRAMUSCULAR | Status: AC
  Filled 2020-11-24: qty 5

## 2020-11-24 MED ORDER — ACETAMINOPHEN 325 MG PO TABS
325.0000 mg | ORAL_TABLET | ORAL | Status: DC | PRN
Start: 2020-11-24 — End: 2020-11-24

## 2020-11-24 MED ORDER — FENTANYL CITRATE (PF) 250 MCG/5ML IJ SOLN
INTRAMUSCULAR | Status: DC | PRN
  Administered 2020-11-24 (×8): 25 ug via INTRAVENOUS

## 2020-11-24 MED ORDER — CHLORHEXIDINE GLUCONATE 4 % EX LIQD: 60.0000 mL | Freq: Once | CUTANEOUS | Status: DC

## 2020-11-24 MED ORDER — ONDANSETRON HCL 4 MG/2ML IJ SOLN
4.0000 mg | Freq: Four times a day (QID) | INTRAMUSCULAR | Status: DC | PRN
  Administered 2020-12-01: 4 mg via INTRAVENOUS
  Filled 2020-11-24 (×2): qty 2

## 2020-11-24 MED ORDER — OXYCODONE HCL 5 MG/5ML PO SOLN
5.0000 mg | Freq: Once | ORAL | Status: DC | PRN
Start: 2020-11-24 — End: 2020-11-24

## 2020-11-24 MED ORDER — BUPIVACAINE HCL (PF) 0.25 % IJ SOLN
INTRAMUSCULAR | Status: AC
  Filled 2020-11-24: qty 30

## 2020-11-24 MED ORDER — FENTANYL CITRATE (PF) 100 MCG/2ML IJ SOLN: 25.0000 ug | INTRAMUSCULAR | Status: DC | PRN

## 2020-11-24 MED ORDER — LIDOCAINE 2% (20 MG/ML) 5 ML SYRINGE
INTRAMUSCULAR | Status: DC | PRN
  Administered 2020-11-24: 40 mg via INTRAVENOUS

## 2020-11-24 MED ORDER — ONDANSETRON HCL 4 MG PO TABS: 4.0000 mg | ORAL_TABLET | Freq: Four times a day (QID) | ORAL | Status: DC | PRN

## 2020-11-24 MED ORDER — ONDANSETRON HCL 4 MG/2ML IJ SOLN
INTRAMUSCULAR | Status: AC
  Filled 2020-11-24: qty 2

## 2020-11-24 SURGICAL SUPPLY — 49 items
BLADE CUDA 5.5 (BLADE) IMPLANT
BLADE EXCALIBUR 4.0X13 (MISCELLANEOUS) ×2 IMPLANT
BNDG COHESIVE 4X5 TAN STRL (GAUZE/BANDAGES/DRESSINGS) ×4 IMPLANT
BNDG COHESIVE 6X5 TAN STRL LF (GAUZE/BANDAGES/DRESSINGS) ×2 IMPLANT
BNDG ELASTIC 6X5.8 VLCR STR LF (GAUZE/BANDAGES/DRESSINGS) ×2 IMPLANT
BNDG GAUZE ELAST 4 BULKY (GAUZE/BANDAGES/DRESSINGS) ×2 IMPLANT
BUR OVAL 6.0 (BURR) IMPLANT
CHLORAPREP W/TINT 26 (MISCELLANEOUS) IMPLANT
COVER LIGHT HANDLE  1/PK (MISCELLANEOUS) ×2
COVER LIGHT HANDLE 1/PK (MISCELLANEOUS) ×2 IMPLANT
COVER SURGICAL LIGHT HANDLE (MISCELLANEOUS) ×4 IMPLANT
COVER WAND RF STERILE (DRAPES) ×2 IMPLANT
CUFF TOURN SGL QUICK 34 (TOURNIQUET CUFF)
CUFF TOURN SGL QUICK 42 (TOURNIQUET CUFF) IMPLANT
CUFF TRNQT CYL 34X4.125X (TOURNIQUET CUFF) IMPLANT
DRAPE ARTHROSCOPY W/POUCH 114 (DRAPES) ×2 IMPLANT
DRAPE STERI 35X30 U-POUCH (DRAPES) ×4 IMPLANT
DRAPE U-SHAPE 47X51 STRL (DRAPES) ×4 IMPLANT
DRSG EMULSION OIL 3X3 NADH (GAUZE/BANDAGES/DRESSINGS) IMPLANT
DRSG PAD ABDOMINAL 8X10 ST (GAUZE/BANDAGES/DRESSINGS) ×2 IMPLANT
EVACUATOR 1/8 PVC DRAIN (DRAIN) ×4 IMPLANT
GAUZE SPONGE 4X4 12PLY STRL (GAUZE/BANDAGES/DRESSINGS) ×2 IMPLANT
GAUZE XEROFORM 5X9 LF (GAUZE/BANDAGES/DRESSINGS) ×2 IMPLANT
GLOVE BIO SURGEON STRL SZ 6.5 (GLOVE) ×2 IMPLANT
GLOVE BIO SURGEON STRL SZ7 (GLOVE) ×2 IMPLANT
GLOVE BIO SURGEON STRL SZ7.5 (GLOVE) ×4 IMPLANT
GLOVE SRG 8 PF TXTR STRL LF DI (GLOVE) ×1 IMPLANT
GLOVE SURG UNDER POLY LF SZ7 (GLOVE) ×4 IMPLANT
GLOVE SURG UNDER POLY LF SZ8 (GLOVE) ×1
GOWN STRL REUS W/ TWL LRG LVL3 (GOWN DISPOSABLE) ×3 IMPLANT
GOWN STRL REUS W/ TWL XL LVL3 (GOWN DISPOSABLE) IMPLANT
GOWN STRL REUS W/TWL LRG LVL3 (GOWN DISPOSABLE) ×3
GOWN STRL REUS W/TWL XL LVL3 (GOWN DISPOSABLE)
KIT BASIN OR (CUSTOM PROCEDURE TRAY) ×2 IMPLANT
KIT TURNOVER KIT B (KITS) ×2 IMPLANT
MANIFOLD NEPTUNE II (INSTRUMENTS) ×2 IMPLANT
NEEDLE 18GX1X1/2 (RX/OR ONLY) (NEEDLE) ×2 IMPLANT
PACK ARTHROSCOPY DSU (CUSTOM PROCEDURE TRAY) ×2 IMPLANT
PAD ARMBOARD 7.5X6 YLW CONV (MISCELLANEOUS) ×4 IMPLANT
PADDING CAST COTTON 6X4 STRL (CAST SUPPLIES) ×2 IMPLANT
PORT APPOLLO RF 90DEGREE MULTI (SURGICAL WAND) IMPLANT
SPONGE LAP 4X18 RFD (DISPOSABLE) IMPLANT
STOCKINETTE IMPERVIOUS LG (DRAPES) ×4 IMPLANT
SUT ETHILON 4 0 PS 2 18 (SUTURE) ×4 IMPLANT
SYR 20ML LL LF (SYRINGE) ×2 IMPLANT
TOWEL GREEN STERILE FF (TOWEL DISPOSABLE) ×4 IMPLANT
TUBE CONNECTING 12X1/4 (SUCTIONS) ×4 IMPLANT
TUBING ARTHROSCOPY IRRIG 16FT (MISCELLANEOUS) ×2 IMPLANT
WATER STERILE IRR 1000ML POUR (IV SOLUTION) ×2 IMPLANT

## 2020-11-24 NOTE — Progress Notes (Signed)
PROGRESS NOTE    Tom Scott  [id number] DOB: Sep 07, 1955 DOA: 11/08/2020 PCP: Patient, No Pcp Per (Inactive)   Brief Narrative: Tom Scott is a 65 y.o. male with a history of atrial fibrillation, hypertension, diabetes mellitus, hyperlipidemia initially presented to [facility] secondary to chest pain and epigastric pain.  He developed septic shock requiring pressors.  Reported to have cholecystitis with choledocholithiasis and possible need for ERCP.  Also developed A. fib with RVR.  He was transferred to [facility] for ERCP and due to septic shock requiring vasopressor support.  He was admitted to the ICU.  General surgery and gastroenterology were consulted for management of his acute calculus cholecystitis.  IR consulted for percutaneous cholecystostomy and patient managed with empiric antibiotics.  While admitted, patient developed ATN requiring initiation of hemodialysis.  Also while admitted, patient was found to have evidence of ESBL E. coli bacteremia from outside hospital report.  Patient's antibiotics have been transitioned to meropenem to treat his bacteremia and cholecystitis.  Hospital course further complicated by septic arthritis of bilateral knees, s/p arthroscopic I&D in OR 6/4   Assessment & Plan:   Principal Problem:   Septic shock ([place]) Active Problems:   Pressure injury of skin   Protein-calorie malnutrition, severe   Encounter for central line placement   AKI (acute kidney injury) ([place])   RUQ abdominal pain   Biliary sepsis   Elevated liver function tests   Septic shock Present on admission.  Secondary to cholecystitis and bacteremia. Patient required ICU admission with vasopressor support. Weaned off vasopressors.  Septic shock resolved.  Acute calculus cholecystitis General surgery and IR consulted. IR placed percutaneous cholecystostomy on 5/20.  LFTs have almost normalized. Fluid culture significant for E. Coli, in addition to  rare bacteroides fragilis.  IR following regarding percutaneous drain, plan drain injection and possible repositioning/exchange of percutaneous cholecystostomy tube which patient declined on 6/2, IR schedule packed 6/3, and is rescheduled for 6/6.  GI was asked to reevaluate patient on 5/29 due to rising LFTs again, felt to be secondary to cholecystitis +/- amiodarone.  GI has since signed off on 5/31.  ID following and remains on meropenem.  ESBL E. Coli bacteremia Report received from OSH per PCCM that reports ESBL bacteremia and patient completed 14 days of IV meropenem on 6/3.  Surveillance blood cultures (5/29) without growth.  Developed fever of 101.2 overnight 5/31.  RUE venous Doppler negative for DVT or SVT.  Drain management per IR- next intervention likely 6/6.  Finally able to obtain a peripheral IV line and left IJ central line to be removed 6/4.    Septic arthritis bilateral knees 6/3, patient reported pain in both his knees, left greater than right.  Ultrasound of both knees showed effusions.  Orthopedics consulted, able to get only minimal fluid from both knees, not enough for cells or crystals.  Left knee synovial fluid gram stain showed rare gram-positive cocci.  ID initiated IV vancomycin.  Orthopedics performed arthroscopic bilateral knee I&D in OR on 6/4.  Follow surgical culture results.  Acute metabolic encephalopathy Secondary to uremia. Resolved.  Acute kidney injury Secondary to ATN secondary to septic shock. Patient started on hemodialysis on 5/23.  Scant to almost no urine output.  No signs of renal recovery.  Nephrology following and undergoing intermittent HD.  Atrial fibrillation with RVR Patient started on amiodarone for control. Currently rate controlled. On Eliquis for stroke prevention.  Rate controlled.  Asymptomatic anemia Patient with unknown baseline. Hemoglobin  of 11 on admission with slow downtrend. No obvious source of bleeding. - On oral iron and s/p  Aranesp.  Likely related to critical illness.  S/p 2 units PRBC across HD on 6/2.  Trend CBCs and transfuse if hemoglobin 7 g or below.  Anasarca Secondary to fluid resuscitation with IV fluids in setting of kidney failure in addition to albuminemia. Current I/O of +17.4L.  Volume management across HD.  Still substantial volume overload with pitting edema of legs and left upper extremity.  Essential hypertension Patient is on amlodipine, losartan and metoprolol as an outpatient. Antihypertensives held secondary to hypotension and septic shock. Blood pressure now better controlled and stable. Could gradually start home regimen if blood pressure start to worsen.  Diabetes mellitus, type 2 Patient is on metformin as an outpatient.  Reasonable inpatient control on SSI.  Hyperlipidemia -Continue Crestor  Severe malnutrition History of gastric bypass. Worse secondary to critical illness. NG tube inserted on 5/25 and patient started on tube feeds on 5/25. NG tube removed by patient on 5/28. -Oral intake -Dietitian recommendations:  Trial Boost Plus chocolate TID- Each supplement provides 360kcal and 14g protein.    30 ml ProSource Plus BID, each supplement provides 100 kcals and 15 grams protein.   MVI with minerals BID per tube  500 mg calcium carbonate TID per tube  Supplement Vitamin C 500 mg BID   Pressure injury Medial coccyx, not POA  Diarrhea Likely multifactorial due to gallbladder disease and antibiotics.  Communicated with ID and okay to use Imodium, initiated trial.  Improved for the last 2 to 3 days.  Hypokalemia Likely due to GI losses and dialysis.  Management per nephrology.  Right upper extremity pain and swelling ?  Related to asymmetric edema complicating underlying arthritis.  Venous Doppler negative for DVT or SVT.  Elevate and monitor.  Resolved   DVT prophylaxis: Eliquis Code Status:   Code Status: Full Code Family Communication: Discussed in detail with  patient spouse at bedside 6/4, updated care and answered all questions.  Advised her that patient declined drain exchange yesterday. Disposition Plan: Discharge likely in several days pending management of AKI vs need for outpatient HD, therapy recommendations   Consultants:   PCCM  Gastroenterology  General surgery  Interventional radiology  Nephrology  Orthopedic  Procedures:   As noted above.  Bilateral knee aspiration by orthopedics 6/3  Bilateral knee arthroscopic I&D in OR by orthopedic 6/4  Antimicrobials:  Flagyl  Cefepime  Meropenem-completed 6/3   Subjective: Seen this morning at HD prior to OR.  Ongoing knee pains.  Diarrhea improved.  Per spouse, has history of gout with an episode of flare.  Has chronic knee pains related to osteoarthritis.  Objective: Vitals:   11/24/20 1158 11/24/20 1405 11/24/20 1420 11/24/20 1435  BP: 115/63 106/60 107/74 (!) 103/57  Pulse: (!) 102 (!) 120 (!) 124 (!) 119  Resp:  '12 20 17  '$ Temp: 98.3 F (36.8 C) 99.1 F (37.3 C)    TempSrc: Oral     SpO2: 96% 91% 90% 93%  Weight: 121 kg     Height: '5\' 10"'$  (1.778 m)       Intake/Output Summary (Last 24 hours) at 11/24/2020 1614 Last data filed at 11/24/2020 1358 Gross per 24 hour  Intake 1130 ml  Output 1310 ml  Net -180 ml   Filed Weights   11/24/20 0658 11/24/20 1028 11/24/20 1158  Weight: 121 kg 120 kg 121 kg    Examination:  General  exam: Middle-age male, moderately built and obese lying comfortably supine in bed undergoing HD. Respiratory system: Clear to auscultation.  No increased work of breathing. Cardiovascular system: S1 and S2 heard, irregularly irregular.  No JVD or murmurs.  1+ pitting bilateral leg edema up to lower thighs.  Right upper extremity swelling resolved.  Telemetry personally reviewed: A. fib with controlled ventricular rate. Gastrointestinal system: Abdomen is nondistended, soft and nontender. No organomegaly or masses felt. Normal bowel  sounds heard. Central nervous system: Alert and oriented. No focal neurological deficits. Extremities: Symmetric 5 x 5 power.  However overall appears weak and deconditioned.  Bilateral knees with mild swelling, increased warmth and tender with painful range of movements but no redness. Skin: No rashes, lesions or ulcers Psychiatry: Judgement and insight appear normal. Mood & affect flat.       Data Reviewed: I have personally reviewed following labs and imaging studies  CBC Lab Results  Component Value Date   WBC 12.1 (H) 11/24/2020   RBC 2.90 (L) 11/24/2020   HGB 7.7 (L) 11/24/2020   HCT 24.5 (L) 11/24/2020   MCV 84.5 11/24/2020   MCH 26.6 11/24/2020   PLT 402 (H) 11/24/2020   MCHC 31.4 11/24/2020   RDW 20.9 (H) 99991111     Last metabolic panel Lab Results  Component Value Date   NA 132 (L) 11/24/2020   K 3.1 (L) 11/24/2020   CL 96 (L) 11/24/2020   CO2 23 11/24/2020   BUN 56 (H) 11/24/2020   CREATININE 5.70 (H) 11/24/2020   GLUCOSE 149 (H) 11/24/2020   GFRNONAA 10 (L) 11/24/2020   CALCIUM 8.1 (L) 11/24/2020   PHOS 6.4 (H) 11/22/2020   PROT 5.5 (L) 11/21/2020   ALBUMIN 1.7 (L) 11/22/2020   BILITOT 1.3 (H) 11/21/2020   ALKPHOS 139 (H) 11/21/2020   AST 48 (H) 11/21/2020   ALT 35 11/21/2020   ANIONGAP 13 11/24/2020    CBG (last 3)  Recent Labs    11/23/20 2129 11/24/20 1157 11/24/20 1405  GLUCAP 168* 121* 121*     GFR: Estimated Creatinine Clearance: 16.4 mL/min (A) (by C-G formula based on SCr of 5.7 mg/dL (H)).  Coagulation Profile: Recent Labs  Lab 11/19/20 0327  INR 1.8*    Recent Results (from the past 240 hour(s))  Culture, blood (routine x 2)     Status: None   Collection Time: 11/18/20  4:57 PM   Specimen: BLOOD RIGHT HAND  Result Value Ref Range Status   Specimen Description BLOOD RIGHT HAND  Final   Special Requests   Final    BOTTLES DRAWN AEROBIC AND ANAEROBIC Blood Culture adequate volume   Culture   Final    NO GROWTH 5  DAYS Performed at [facility], [place] [address], [place], [place] [address]    Report Status 11/23/2020 FINAL  Final  Culture, blood (routine x 2)     Status: None   Collection Time: 11/18/20  4:59 PM   Specimen: BLOOD LEFT HAND  Result Value Ref Range Status   Specimen Description BLOOD LEFT HAND  Final   Special Requests   Final    BOTTLES DRAWN AEROBIC AND ANAEROBIC Blood Culture adequate volume   Culture   Final    NO GROWTH 5 DAYS Performed at [facility], [place] [address] [place], [place] [address]    Report Status 11/23/2020 FINAL  Final  Body fluid culture w Gram Stain     Status: None (Preliminary result)  Collection Time: 11/23/20  7:31 PM   Specimen: Body Fluid  Result Value Ref Range Status   Specimen Description FLUID SYNOVIAL RIGHT KNEE  Final   Special Requests Immunocompromised  Final   Gram Stain   Final    RARE WBC PRESENT, PREDOMINANTLY MONONUCLEAR NO ORGANISMS SEEN Performed at [facility], [address], [place], [place] [address]    Culture PENDING  Incomplete   Report Status PENDING  Incomplete  Body fluid culture w Gram Stain     Status: None (Preliminary result)   Collection Time: 11/23/20  7:32 PM   Specimen: Body Fluid  Result Value Ref Range Status   Specimen Description FLUID SYNOVIAL LEFT KNEE  Final   Special Requests Immunocompromised  Final   Gram Stain   Final    FEW WBC PRESENT,BOTH PMN AND MONONUCLEAR RARE GRAM POSITIVE COCCI Performed at [facility], [place] [address] [place], [place] [address]    Culture PENDING  Incomplete   Report Status PENDING  Incomplete        Radiology Studies: DG Knee 1-2 Views Left  Result Date: 11/23/2020 CLINICAL DATA:  Pain. EXAM: LEFT KNEE - 1-2 VIEW COMPARISON:  None. FINDINGS: No evidence of fracture, dislocation. Small joint effusion. 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 No aggressive appearing focal bone abnormality. Vascular calcifications.  Diffuse mild subcutaneus soft tissue edema. IMPRESSION: 1. 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 2. No acute displaced fracture or dislocation of the left knee on two-view radiograph. Electronically Signed   By: Iven Finn M.D.   On: 11/23/2020 22:17   DG Knee 1-2 Views Right  Result Date: 11/23/2020 CLINICAL DATA:  Pain. EXAM: RIGHT KNEE - 1-2 VIEW COMPARISON:  None. FINDINGS: No evidence of fracture, dislocation. Likely small joint effusion. Severe tricompartmental degenerative changes of the right knee. No aggressive appearing focal bone abnormality. Severe vascular calcifications. Soft tissues are unremarkable. IMPRESSION: 1. Severe tricompartmental degenerative changes of the right knee. 2. Likely small joint effusion with limited evaluation due to positioning. 3. No acute displaced fracture or dislocation of the right knee on two-view radiograph. Electronically Signed   By: Iven Finn M.D.   On: 11/23/2020 22:19   [place] RT LOWER EXTREM LTD SOFT TISSUE NON VASCULAR  Result Date: 11/23/2020 CLINICAL DATA:  Bilateral knee pain x5 months with possible joint effusion. EXAM: ULTRASOUND BILATERAL LOWER EXTREMITY LIMITED TECHNIQUE: Ultrasound examination of the RIGHT and LEFT knees was performed in the area of clinical concern. COMPARISON:  None. FINDINGS: Joint Space: A 7.2 cm x 6.9 cm x 2.0 cm area of complex hypoechogenicity is seen along the anterior aspect of the right knee. A similar appearing 5.7 cm x 2.0 cm x 5.6 cm area of complex hypoechogenicity is seen along the anterior aspect of the left knee. No abnormal flow is noted within these regions on color Doppler evaluation. Muscles: Normal. Tendons: Normal Other Soft Tissue Structures: Diffusely edematous soft tissues are noted bilaterally. IMPRESSION: 1. Bilateral cellulitis with additional findings that may represent large, bilateral complex knee effusions. MRI correlation is  recommended, as soft tissue abscesses cannot be excluded. Electronically Signed   By: Virgina Norfolk M.D.   On: 11/23/2020 16:55   [place] LT LOWER EXTREM LTD SOFT TISSUE NON VASCULAR  Result Date: 11/23/2020 CLINICAL DATA:  Bilateral knee pain with possible joint effusions. EXAM: ULTRASOUND BILATERAL LOWER EXTREMITY LIMITED TECHNIQUE: Ultrasound examination of the RIGHT and LEFT knees  was performed in the area of clinical concern. COMPARISON:  None. FINDINGS: Joint Space: A 7.2 cm x 6.9 cm x 2.0 cm area of complex hypoechogenicity is seen along the anterior aspect of the right knee. A similar appearing 5.7 cm x 2.0 cm x 5.6 cm area of complex hypoechogenicity is seen along the anterior aspect of the left knee. No abnormal flow is noted within these regions on color Doppler evaluation. Muscles: Normal. Tendons: Normal Other Soft Tissue Structures: Diffusely edematous soft tissues are noted bilaterally. IMPRESSION: 1. Bilateral cellulitis with additional findings that may represent large, bilateral complex knee effusions. MRI correlation is recommended, as soft tissue abscesses cannot be excluded. Electronically Signed   By: Virgina Norfolk M.D.   On: 11/23/2020 16:56        Scheduled Meds: . (feeding supplement) PROSource Plus  30 mL Oral BID BM  . amiodarone  200 mg Oral Daily  . apixaban  5 mg Oral BID  . vitamin C  500 mg Oral BID  . calcium carbonate  1 tablet Oral TID  . Chlorhexidine Gluconate Cloth  6 each Topical Q0600  . cholecalciferol  1,000 Units Oral Daily  . darbepoetin (ARANESP) injection - DIALYSIS  40 mcg Intravenous Q Sat-HD  . docusate sodium  100 mg Oral BID  . insulin aspart  0-6 Units Subcutaneous TID WC  . iron polysaccharides  150 mg Oral Daily  . lactose free nutrition  237 mL Oral TID WC  . lidocaine  1 patch Transdermal Q24H  . multivitamin with minerals  1 tablet Oral BID  . rosuvastatin  10 mg Oral Daily  . sodium chloride flush  10-40 mL Intracatheter Q12H  .  sodium chloride flush  5 mL Intracatheter Q8H  . vancomycin variable dose per unstable renal function (pharmacist dosing)   Does not apply See admin instructions  . zinc sulfate  220 mg Oral Daily   Continuous Infusions: . sodium chloride    . acetaminophen    . albumin human    . albumin human       LOS: 16 days    [name], MD, [place], [facility]. [facility]  To contact the attending provider between 7A-7P or the covering provider during after hours 7P-7A, please log into the web site [email or web address] and access using universal [place] password for that web site. If you do not have the password, please call the hospital operator.

## 2020-11-24 NOTE — Progress Notes (Signed)
   11/24/20 1615  Assess: MEWS Score  Temp 98.2 F (36.8 C)  BP (!) 90/55  Pulse Rate (!) 106  Resp 18  SpO2 91 %  O2 Device Room Air  Assess: MEWS Score  MEWS Temp 0  MEWS Systolic 1  MEWS Pulse 1  MEWS RR 0  MEWS LOC 0  MEWS Score 2  MEWS Score Color Yellow  Assess: if the MEWS score is Yellow or Red  Were vital signs taken at a resting state? Yes  Focused Assessment No change from prior assessment  Early Detection of Sepsis Score *See Row Information* Medium  MEWS guidelines implemented *See Row Information* Yes  Treat  MEWS Interventions Escalated (See documentation below)  Pain Scale PAINAD  Pain Score 4  Pain Type Acute pain  Pain Location Back  Pain Orientation Mid  Multiple Pain Sites No  Breathing 0  Negative Vocalization 0  Facial Expression 0  Body Language 0  Consolability 0  PAINAD Score 0  Notify: Charge Nurse/RN  Name of Charge Nurse/RN Notified Zee, RN  Date Charge Nurse/RN Notified 11/24/20  Time Charge Nurse/RN Notified 1620  Notify: Provider  Provider Name/Title Algis Liming, MD  Date Provider Notified 11/24/20  Time Provider Notified 1700  Notification Type Call  Notification Reason Critical result  Provider response Other (Comment) (lay patient flat, recheck in 30 minutes, then in 1 hour)  Date of Provider Response 11/24/20  Time of Provider Response 1705

## 2020-11-24 NOTE — Transfer of Care (Signed)
Immediate Anesthesia Transfer of Care Note  Patient: Tom Scott  Procedure(s) Performed: ARTHROSCOPIC  DEBRIDMENT KNEE (Bilateral Knee)  Patient Location: PACU  Anesthesia Type:General  Level of Consciousness: awake, alert  and oriented  Airway & Oxygen Therapy: Patient Spontanous Breathing and Patient connected to face mask oxygen  Post-op Assessment: Report given to RN and Post -op Vital signs reviewed and stable  Post vital signs: Reviewed and stable  Last Vitals:  Vitals Value Taken Time  BP 106/60 11/24/20 1405  Temp    Pulse 117 11/24/20 1409  Resp 23 11/24/20 1409  SpO2 94 % 11/24/20 1409  Vitals shown include unvalidated device data.  Last Pain:  Vitals:   11/24/20 1158  TempSrc: Oral  PainSc: 0-No pain      Patients Stated Pain Goal: 0 ([id number])  Complications: No complications documented.

## 2020-11-24 NOTE — Op Note (Signed)
Procedure(s): ARTHROSCOPIC  DEBRIDMENT KNEE Procedure Note  Tom Scott male 65 y.o. 11/24/2020  Preoperative diagnosis: Bilateral septic knee arthritis  Postoperative diagnosis: Same  Procedure(s) and Anesthesia Type:    Bilateral ARTHROSCOPIC  DEBRIDMENT SEPTIC KNEES - General  Surgeon(s) and Role:    Tania Ade, MD - Primary     Surgeon: Isabella Stalling   Assistants: None  Anesthesia: General endotracheal anesthesia    Procedure Detail  ARTHROSCOPIC  DEBRIDMENT KNEE  Estimated Blood Loss: Min         Drains: none  Blood Given: none         Specimens: none        Complications:  * No complications entered in OR log *         Disposition: PACU - hemodynamically stable.         Condition: stable    Procedure:   INDICATIONS FOR SURGERY: The patient is 65 y.o. male who has been in the hospital for over 2 weeks very sick with sepsis.  He was found yesterday to have increased knee pain and swelling.  I was asked to aspirate the knees to assist with diagnosis.  Today the left knee gram stain was positive for gram-positive cocci.  I felt that given his clinical picture with elevated white blood cells periodic fevers despite antibiotics and cloudy aspirate in both knees with one knee positive for gram-positive cocci, he was indicated for arthroscopic debridement of both knees presuming both knees are infected.  OPERATIVE FINDINGS: Examination under anesthesia: Bilateral knees with moderate to large knee effusions and warmth.   DESCRIPTION OF PROCEDURE: The patient was identified in preoperative  holding area where I personally marked the operative site after  verifying site, side, and procedure with the patient.  The patient was taken back to the operating room where general anesthesia was induced without complication and was placed in the supine position.  Bilateral lower extremities were then prepped and  draped in a standard sterile fashion. The  appropriate time-out  procedure was carried out.  Antibiotics were held until cultures were taken.  Attention was first turned to the right knee.  A small stab incision was made in the anterolateral portal position. The arthroscope was introduced in the joint.  The cloudy pink fluid was sent for culture gram stain and cell count.  A medial portal was then established and the shaver was introduced into the joint.  There was noted to be a significant amount of phlegmonous material in the joint.  The shaver was used to debride as much of this material as possible.  The joint was severely degenerated consistent with his x-rays which showed severe arthritis.  All compartments of the knee were visualized and debrided.  A total of 6 L normal saline was used to irrigate the joint with debridement.  Attention was then turned to the left knee where the lateral portal was established in the same fashion.  Again the fluid was cloudy and pink and sent for gram stain culture and cell count.  The medial portal was then established and the shaver was introduced.  Again there was significant phlegmonous material in the joint.  The shaver was used in all compartments of the joint and again a total of 6 L of normal saline was used to irrigate the joint with debridement.  Medium Hemovac drains were placed out the lateral knee and both knees and connected to suction drains.  The drains were sewn in place  and the portals were closed with 4-0 nylon.   Sterile dressings were then applied including Xeroform 4 x 4's ABDs an ACE bandage.  The patient was then allowed to awaken from general anesthesia, transferred to the stretcher and taken to the recovery room in stable condition.   POSTOPERATIVE PLAN: The patient will be readmitted to the internal medicine service.  We will likely keep drains in for 48 hours and then can remove them.  We will follow along.  He can be weightbearing as tolerated when able.

## 2020-11-24 NOTE — Anesthesia Preprocedure Evaluation (Addendum)
Anesthesia Evaluation  Patient identified by MRN, date of birth, ID band Patient awake    Reviewed: Allergy & Precautions, NPO status , Patient's Chart, lab work & pertinent test results  Airway Mallampati: III  TM Distance: >3 FB Neck ROM: Full    Dental  (+) Poor Dentition, Missing, Chipped, Dental Advisory Given   Pulmonary neg pulmonary ROS,     + decreased breath sounds      Cardiovascular hypertension, Pt. on medications and Pt. on home beta blockers + dysrhythmias Atrial Fibrillation  Rhythm:Irregular Rate:Tachycardia     Neuro/Psych negative neurological ROS  negative psych ROS   GI/Hepatic negative GI ROS, Neg liver ROS,   Endo/Other  diabetes, Type 2, Oral Hypoglycemic Agents  Renal/GU CRFRenal disease     Musculoskeletal negative musculoskeletal ROS (+)   Abdominal Normal abdominal exam  (+)   Peds  Hematology negative hematology ROS (+)   Anesthesia Other Findings   Reproductive/Obstetrics                            Anesthesia Physical Anesthesia Plan  ASA: III  Anesthesia Plan: General   Post-op Pain Management:    Induction: Intravenous  PONV Risk Score and Plan: 3 and Ondansetron, Dexamethasone and Midazolam  Airway Management Planned: LMA  Additional Equipment: None  Intra-op Plan:   Post-operative Plan: Extubation in OR  Informed Consent: I have reviewed the patients History and Physical, chart, labs and discussed the procedure including the risks, benefits and alternatives for the proposed anesthesia with the patient or authorized representative who has indicated his/her understanding and acceptance.     Dental advisory given  Plan Discussed with: CRNA  Anesthesia Plan Comments:        Anesthesia Quick Evaluation

## 2020-11-24 NOTE — Procedures (Signed)
Patient seen on Hemodialysis. BP (!) 101/59   Pulse 99   Temp 100.1 F (37.8 C) (Oral)   Resp 16   Ht '5\' 10"'$  (1.778 m)   Wt 121 kg   SpO2 95%   BMI 38.28 kg/m   QB 400, UF goal 1.5L Tolerating treatment without complaints at this time.   Elmarie Shiley MD [facility]. Office # [phone] Pager # [phone] 10:13 AM

## 2020-11-24 NOTE — Progress Notes (Signed)
[place] stated pt. Went to dialysis this am.  Potassium 3.1 @ 0237, per Dr. Brandyn Thien Robert no labs needed prior to surgery.

## 2020-11-24 NOTE — Anesthesia Procedure Notes (Signed)
Procedure Name: LMA Insertion Date/Time: 11/24/2020 12:38 PM Performed by: Candis Shine, CRNA Pre-anesthesia Checklist: Patient identified, Emergency Drugs available, Suction available and Patient being monitored Patient Re-evaluated:Patient Re-evaluated prior to induction Oxygen Delivery Method: Circle System Utilized Preoxygenation: Pre-oxygenation with 100% oxygen Induction Type: IV induction LMA: LMA inserted LMA Size: 4.0 Number of attempts: 1 Placement Confirmation: positive ETCO2 Tube secured with: Tape Dental Injury: Teeth and Oropharynx as per pre-operative assessment

## 2020-11-24 NOTE — Anesthesia Postprocedure Evaluation (Signed)
Anesthesia Post Note  Patient: Tom Scott  Procedure(s) Performed: ARTHROSCOPIC  DEBRIDMENT KNEE (Bilateral Knee)     Patient location during evaluation: PACU Anesthesia Type: General Level of consciousness: awake and alert Pain management: pain level controlled Vital Signs Assessment: post-procedure vital signs reviewed and stable Respiratory status: spontaneous breathing, nonlabored ventilation, respiratory function stable and patient connected to nasal cannula oxygen Cardiovascular status: blood pressure returned to baseline and stable Postop Assessment: no apparent nausea or vomiting Anesthetic complications: no   No complications documented.  Last Vitals:  Vitals:   11/24/20 1615 11/24/20 1731  BP: (!) 90/55 105/61  Pulse: (!) 106 (!) 109  Resp: 18   Temp: 36.8 C   SpO2: 91% 90%    Last Pain:  Vitals:   11/24/20 1956  TempSrc:   PainSc: 0-No pain                 Effie Berkshire

## 2020-11-24 NOTE — Progress Notes (Signed)
[place] for Infectious Disease  Date of Admission:  11/08/2020           Reason for visit: Follow up on ESBL E. coli bacteremia, knee pain  Current antibiotics: Meropenem completed 6/3  ASSESSMENT:    ESBL E. coli bacteremia.  Status post 14 days meropenem Cholecystitis.  Status post percutaneous cholecystostomy tube 5/20.  Plan for image guided cholangiogram with possible exchange/manipulation 11/26/2020 Septic arthritis bilateral knees.  Status post aspirations with GPC on gram stain.  Concerning for hematogenous source Fevers.  T-max 100.6 ESRD on HD  PLAN:    Appreciate orthopedics evaluation.  Plan for bilateral knee washouts today. Add vancomycin per pharmacy Check blood cultures DC central lines as able   Principal Problem:   Septic shock ([place]) Active Problems:   Pressure injury of skin   Protein-calorie malnutrition, severe   Encounter for central line placement   AKI (acute kidney injury) ([place])   RUQ abdominal pain   Biliary sepsis   Elevated liver function tests    MEDICATIONS:    Scheduled Meds: . (feeding supplement) PROSource Plus  30 mL Oral BID BM  . amiodarone  200 mg Oral Daily  . apixaban  5 mg Oral BID  . vitamin C  500 mg Oral BID  . calcium carbonate  1 tablet Oral TID  . Chlorhexidine Gluconate Cloth  6 each Topical Q0600  . cholecalciferol  1,000 Units Oral Daily  . Darbepoetin Alfa      . darbepoetin (ARANESP) injection - DIALYSIS  40 mcg Intravenous Q Sat-HD  . heparin sodium (porcine)      . heparin sodium (porcine)  3,800 Units Intracatheter Once  . insulin aspart  0-6 Units Subcutaneous TID WC  . iron polysaccharides  150 mg Oral Daily  . lactose free nutrition  237 mL Oral TID WC  . lidocaine  1 patch Transdermal Q24H  . multivitamin with minerals  1 tablet Oral BID  . rosuvastatin  10 mg Oral Daily  . sodium chloride flush  10-40 mL Intracatheter Q12H  . sodium chloride flush  5 mL Intracatheter Q8H  . zinc sulfate   220 mg Oral Daily   Continuous Infusions: . sodium chloride    . albumin human     PRN Meds:.acetaminophen, docusate, heparin, loperamide, ondansetron (ZOFRAN) IV, oxyCODONE, polyethylene glycol, sodium chloride flush  SUBJECTIVE:   24 hour events:  Status post b/l knee aspiration with GPC  No new complaints.  Has knee pain and fevers.  Review of Systems  All other systems reviewed and are negative.     OBJECTIVE:   Blood pressure 130/71, pulse 100, temperature 100.1 F (37.8 C), temperature source Oral, resp. rate 16, height '5\' 10"'$  (1.778 m), weight 121 kg, SpO2 95 %. Body mass index is 38.28 kg/m.  Physical Exam Constitutional:      Comments: Chronically ill appearing.  Seen on HD unit.   Neck:     Comments: Left IJ non-tunneled triple lumen HD cath Right IJ tunneled double lumen HD cath   Pulmonary:     Effort: Pulmonary effort is normal. No respiratory distress.  Musculoskeletal:        General: Tenderness present.     Comments: Bilateral knee tenderness.  No significant warmth/erythema.   Skin:    General: Skin is warm and dry.     Coloration: Skin is not jaundiced.  Neurological:     General: No focal deficit present.  Mental Status: He is oriented to person, place, and time.  Psychiatric:        Mood and Affect: Mood normal.        Behavior: Behavior normal.      Lab Results: Lab Results  Component Value Date   WBC 12.1 (H) 11/24/2020   HGB 7.7 (L) 11/24/2020   HCT 24.5 (L) 11/24/2020   MCV 84.5 11/24/2020   PLT 402 (H) 11/24/2020    Lab Results  Component Value Date   NA 132 (L) 11/24/2020   K 3.1 (L) 11/24/2020   CO2 23 11/24/2020   GLUCOSE 149 (H) 11/24/2020   BUN 56 (H) 11/24/2020   CREATININE 5.70 (H) 11/24/2020   CALCIUM 8.1 (L) 11/24/2020   GFRNONAA 10 (L) 11/24/2020    Lab Results  Component Value Date   ALT 35 11/21/2020   AST 48 (H) 11/21/2020   ALKPHOS 139 (H) 11/21/2020   BILITOT 1.3 (H) 11/21/2020        Component Value Date/Time   CRP 26.3 (H) 11/22/2020 0059       Component Value Date/Time   ESRSEDRATE 94 (H) 11/22/2020 0059     I have reviewed the micro and lab results in Epic.  Imaging: DG Knee 1-2 Views Left  Result Date: 11/23/2020 CLINICAL DATA:  Pain. EXAM: LEFT KNEE - 1-2 VIEW COMPARISON:  None. FINDINGS: No evidence of fracture, dislocation. Small joint effusion. 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 No aggressive appearing focal bone abnormality. Vascular calcifications. Diffuse mild subcutaneus soft tissue edema. IMPRESSION: 1. 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 2. No acute displaced fracture or dislocation of the left knee on two-view radiograph. Electronically Signed   By: Iven Finn M.D.   On: 11/23/2020 22:17   DG Knee 1-2 Views Right  Result Date: 11/23/2020 CLINICAL DATA:  Pain. EXAM: RIGHT KNEE - 1-2 VIEW COMPARISON:  None. FINDINGS: No evidence of fracture, dislocation. Likely small joint effusion. Severe tricompartmental degenerative changes of the right knee. No aggressive appearing focal bone abnormality. Severe vascular calcifications. Soft tissues are unremarkable. IMPRESSION: 1. Severe tricompartmental degenerative changes of the right knee. 2. Likely small joint effusion with limited evaluation due to positioning. 3. No acute displaced fracture or dislocation of the right knee on two-view radiograph. Electronically Signed   By: Iven Finn M.D.   On: 11/23/2020 22:19   [place] RT LOWER EXTREM LTD SOFT TISSUE NON VASCULAR  Result Date: 11/23/2020 CLINICAL DATA:  Bilateral knee pain x5 months with possible joint effusion. EXAM: ULTRASOUND BILATERAL LOWER EXTREMITY LIMITED TECHNIQUE: Ultrasound examination of the RIGHT and LEFT knees was performed in the area of clinical concern. COMPARISON:  None. FINDINGS: Joint Space: A 7.2 cm x 6.9 cm x 2.0 cm area of complex  hypoechogenicity is seen along the anterior aspect of the right knee. A similar appearing 5.7 cm x 2.0 cm x 5.6 cm area of complex hypoechogenicity is seen along the anterior aspect of the left knee. No abnormal flow is noted within these regions on color Doppler evaluation. Muscles: Normal. Tendons: Normal Other Soft Tissue Structures: Diffusely edematous soft tissues are noted bilaterally. IMPRESSION: 1. Bilateral cellulitis with additional findings that may represent large, bilateral complex knee effusions. MRI correlation is recommended, as soft tissue abscesses cannot be excluded. Electronically Signed   By: Virgina Norfolk M.D.   On: 11/23/2020 16:55   [place] LT LOWER EXTREM LTD SOFT TISSUE NON VASCULAR  Result Date:  11/23/2020 CLINICAL DATA:  Bilateral knee pain with possible joint effusions. EXAM: ULTRASOUND BILATERAL LOWER EXTREMITY LIMITED TECHNIQUE: Ultrasound examination of the RIGHT and LEFT knees was performed in the area of clinical concern. COMPARISON:  None. FINDINGS: Joint Space: A 7.2 cm x 6.9 cm x 2.0 cm area of complex hypoechogenicity is seen along the anterior aspect of the right knee. A similar appearing 5.7 cm x 2.0 cm x 5.6 cm area of complex hypoechogenicity is seen along the anterior aspect of the left knee. No abnormal flow is noted within these regions on color Doppler evaluation. Muscles: Normal. Tendons: Normal Other Soft Tissue Structures: Diffusely edematous soft tissues are noted bilaterally. IMPRESSION: 1. Bilateral cellulitis with additional findings that may represent large, bilateral complex knee effusions. MRI correlation is recommended, as soft tissue abscesses cannot be excluded. Electronically Signed   By: Virgina Norfolk M.D.   On: 11/23/2020 16:56     Imaging  independently reviewed in Epic.    Raynelle [facility] for Infectious Disease [place] [phone] pager 11/24/2020, 10:16 AM  I spent greater than 35 minutes with the  patient including greater than 50% of time in face to face counsel of the patient and in coordination of their care.

## 2020-11-24 NOTE — Progress Notes (Signed)
   11/24/20 1615  Assess: MEWS Score  Temp 98.2 F (36.8 C)  BP (!) 90/55  Pulse Rate (!) 106  Resp 18  SpO2 91 %  O2 Device Room Air  Assess: MEWS Score  MEWS Temp 0  MEWS Systolic 1  MEWS Pulse 1  MEWS RR 0  MEWS LOC 0  MEWS Score 2  MEWS Score Color Yellow  Assess: if the MEWS score is Yellow or Red  Were vital signs taken at a resting state? Yes  Focused Assessment No change from prior assessment  Early Detection of Sepsis Score *See Row Information* Medium  MEWS guidelines implemented *See Row Information* Yes  Treat  MEWS Interventions Escalated (See documentation below)  Pain Scale PAINAD  Pain Score 4  Pain Type Acute pain  Pain Location Back  Pain Orientation Mid  Multiple Pain Sites No  Breathing 0  Negative Vocalization 0  Facial Expression 0  Body Language 0  Consolability 0  PAINAD Score 0  Notify: Charge Nurse/RN  Name of Charge Nurse/RN Notified Zee, RN  Date Charge Nurse/RN Notified 11/24/20  Time Charge Nurse/RN Notified 1620  Notify: Provider  Provider Name/Title Algis Liming, MD  Date Provider Notified 11/24/20  Time Provider Notified 1700  Notification Type Call  Notification Reason Critical result  Provider response Other (Comment) (recheck in 30 minutes, then in 1 hour)  Date of Provider Response 11/24/20  Time of Provider Response 1705

## 2020-11-24 NOTE — TOC Progression Note (Signed)
Transition of Care [facility]) - Progression Note    Patient Details  Name: Tom Scott MRN: [id number] Date of Birth: 1955-12-11  Transition of Care [facility]) CM/SW [place], [place] Phone Number: 11/24/2020, 3:51 PM  Clinical Narrative:     CSW following to see if patient will need HD. Patient has SNF bed at [facility]. [place] will need to start insurance authorization for patient close to patient being medically ready for dc.CSW will continue to follow and assist with dc planning needs.    Expected Discharge Plan: [place] Barriers to Discharge: Continued Medical Work up  Expected Discharge Plan and Services Expected Discharge Plan: [place] arrangements for the past 2 months: Single Family Home                                       Social Determinants of Health (SDOH) Interventions    Readmission Risk Interventions No flowsheet data found.

## 2020-11-24 NOTE — Progress Notes (Signed)
   PATIENT ID: Denyse Amass   Day of Surgery Procedure(s) (LRB): ARTHROSCOPIC  DEBRIDMENT KNEE (Bilateral)  Subjective: The patient is in dialysis.  He continues to have bilateral severe knee pain and swelling.  The aspirate from the left knee yesterday showed rare gram-positive cocci.  Right knee gram stain negative but cultures pending.  Objective:  Vitals:   11/24/20 0930 11/24/20 1000  BP: (!) 101/59 130/71  Pulse: 99 100  Resp:    Temp:    SpO2:       Lying in bed in no acute distress.  Bilateral knees continue to have moderate to large knee effusions with warmth but no erythema.  He has pain with any palpation or range of motion.  Labs:  Recent Labs    11/22/20 0059 11/23/20 0357 11/24/20 0237  HGB 7.0* 8.0* 7.7*   Recent Labs    11/23/20 0357 11/24/20 0237  WBC 14.5* 12.1*  RBC 2.94* 2.90*  HCT 24.9* 24.5*  PLT 374 402*   Recent Labs    11/22/20 0059 11/24/20 0237  NA 129* 132*  K 3.1* 3.1*  CL 95* 96*  CO2 22 23  BUN 54* 56*  CREATININE 5.65* 5.70*  GLUCOSE 160* 149*  CALCIUM 7.9* 8.1*    Assessment and Plan: Concern for bilateral septic knees The fluid in both knees really looked the same, in fact the fluid from the right knee looked a bit more cloudy than the left.  I think if he is showing bacteria in the left knee, both knees are likely infected.  At this point given his clinical picture and aspirate findings I think it would be best to arthroscopically washout both knees today.  We will also be able to get a larger amount of fluid to send for crystal analysis as well as additional cultures. I spoke with the patient and his wife about this and they both would like to move forward.  I also spoke with his primary treating physician, who spoke with the infectious disease team and all agree that this is the right course of action at this time.  He will remain n.p.o. and will be taken to the operating room today for arthroscopic I&D of both knees.

## 2020-11-24 NOTE — Progress Notes (Signed)
Pharmacy Antibiotic Note  Tom Scott is a 65 y.o. male admitted on 11/08/2020 with septic shock s/p meropenem x2 weeks for ESBL E. Coli infection. Pt now with possible septic arthritis, pharmacy has been consulted for vancomycin dosing. Knee aspirate with rare GPCs. Pt has AKI which has required iHD, likely ESRD. HD in progress for today. Per MD wait until blood cultures drawn to give vanco.  Plan: Vancomycin '2500mg'$  x1 then '1000mg'$  qHD (not ordered) F/U cultures, LOT Vancomycin levels as needed   Height: '5\' 10"'$  (177.8 cm) Weight: 121 kg (266 lb 12.1 oz) IBW/kg (Calculated) : 73  Temp (24hrs), Avg:99.9 F (37.7 C), Min:99.2 F (37.3 C), Max:100.6 F (38.1 C)  Recent Labs  Lab 11/19/20 0327 11/20/20 0112 11/20/20 0944 11/21/20 0245 11/22/20 0059 11/23/20 0357 11/24/20 0237  WBC 13.2* 13.8*  --  12.6* 12.0* 14.5* 12.1*  CREATININE 6.04*  --  7.34* 4.78* 5.65*  --  5.70*    Estimated Creatinine Clearance: 16.4 mL/min (A) (by C-G formula based on SCr of 5.7 mg/dL (H)).    Allergies  Allergen Reactions  . Penicillamine     Other reaction(s): lips swell  . Penicillins     Other reaction(s): Swelling  Legacy System: CCA Onset Date: <blank> Substance Legacy/Cerner: penicillins / penicillins (Legacy value) Category: Drug Severity Legacy/Cerner: <blank> / Unknown Reaction(s): swelling Comments: <blank>  Legacy System: CCA Onset Date: <blank> Substance Legacy/Cerner: ampicillin / ampicillin (Legacy value) Category: Drug Severity Legacy/Cerner: <blank> / Unknown Reaction(s): swelling Comments: <blank>  Legacy System: CCA Onset Date: <blank> Substance Legacy/Cerner: ampicillin / ampicillin (Legacy value) Category: Drug Severity Legacy/Cerner: <blank> / Unknown Reaction(s): swelling Comments: <blank>  Legacy System: CCA Onset Date: <blank> Substance Legacy/Cerner: penicillins / penicillins (Legacy value) Category: Drug Severity Legacy/Cerner: <blank> /  Unknown Reaction(s): swelling Comments: <blank>     Antimicrobials this admission: Meropenem 5/21 >> 6/3 Vancomycin 6/4 >>  Microbiology results: 5/20 BCx: negative 5/20 gall bladder abscess ESBL E. Coli 5/29 BCx: negative 6/3 Synovial: GPCs (L knee only)  Arrie Senate, PharmD, BCPS, BCCP Clinical Pharmacist Please check AMION for all [facility] Pharmacy numbers 11/24/2020

## 2020-11-24 NOTE — Progress Notes (Signed)
Hollis MD is aware of patient's systolic BP of [id number] and heart rate in 100-120 due to A fib. Patient received 2 bottles of Albumin 5%, check MAR for details. Patient has been in A fib prior to coming to surgery. MAP of blood pressure is within the appropriate range. MD cleared patient to go back to telemetry medical on [place]. Patient is stable upon leaving PACU.

## 2020-11-25 ENCOUNTER — Encounter (HOSPITAL_COMMUNITY): Payer: Self-pay | Admitting: Orthopedic Surgery

## 2020-11-25 LAB — GLUCOSE, CAPILLARY
Glucose-Capillary: 212 mg/dL — ABNORMAL HIGH (ref 70–99)
Glucose-Capillary: 210 mg/dL — ABNORMAL HIGH (ref 70–99)
Glucose-Capillary: 209 mg/dL — ABNORMAL HIGH (ref 70–99)
Glucose-Capillary: 216 mg/dL — ABNORMAL HIGH (ref 70–99)

## 2020-11-25 MED ORDER — HYDROXYZINE HCL 25 MG PO TABS
25.0000 mg | ORAL_TABLET | Freq: Once | ORAL | Status: AC
  Administered 2020-11-25: 25 mg via ORAL
  Filled 2020-11-25: qty 1

## 2020-11-25 MED ORDER — SIMETHICONE 80 MG PO CHEW
80.0000 mg | CHEWABLE_TABLET | Freq: Four times a day (QID) | ORAL | Status: DC | PRN
  Administered 2020-11-25 (×2): 80 mg via ORAL
  Filled 2020-11-25 (×2): qty 1

## 2020-11-25 MED ORDER — GERHARDT'S BUTT CREAM
TOPICAL_CREAM | Freq: Two times a day (BID) | CUTANEOUS | Status: DC
  Filled 2020-11-25: qty 1

## 2020-11-25 NOTE — Progress Notes (Signed)
  PATIENT ID: Tom Scott  MRN: [id number]  DOB/AGE:  67-14-1957 / 65 y.o.  1 Day Post-Op Procedure(s) (LRB): ARTHROSCOPIC  DEBRIDMENT KNEE (Bilateral)--Dr. Tamera Punt  Subjective: Pain is tolerable at rest, worsened with knee movement.  Objective: Vital signs in last 24 hours: Temp:  [98 F (36.7 C)-99.3 F (37.4 C)] 98.4 F (36.9 C) (06/05 0649) Pulse Rate:  [92-124] 102 (06/05 0404) Resp:  [12-20] 18 (06/05 0404) BP: (88-130)/(50-83) 99/66 (06/05 0404) SpO2:  [90 %-97 %] 93 % (06/05 0404) Weight:  [120 kg-121 kg] 120.6 kg (06/05 0409)  Intake/Output from previous day: 06/04 0701 - 06/05 0700 In: 1893 [P.O.:240; I.V.:450; IV Piggyback:1203] Out: 1060 [Drains:50; Blood:10] Intake/Output this shift: No intake/output data recorded.  Recent Labs    11/23/20 0357 11/24/20 0237 11/24/20 2203  HGB 8.0* 7.7* 7.9*   Recent Labs    11/24/20 0237 11/24/20 2203  WBC 12.1* 14.3*  RBC 2.90* 2.90*  HCT 24.5* 25.0*  PLT 402* 434*   Recent Labs    11/24/20 0237 11/24/20 2203  NA 132* 132*  K 3.1* 4.7  CL 96* 100  CO2 23 19*  BUN 56* 41*  CREATININE 5.70* 4.16*  GLUCOSE 149* 270*  CALCIUM 8.1* 8.1*   No results for input(s): LABPT, INR in the last 72 hours.  Physical Exam: B knee dressings C/D/I Drains with minimal output LE remain edematous, but with intact LT sens P/D/1web with intact PF/DF toes/ankle  Assessment/Plan: 1 Day Post-Op Procedure(s) (LRB): ARTHROSCOPIC  DEBRIDMENT KNEE (Bilateral)   WBAT Drains likely to be pulled tomorrow Dr. Tamera Punt to resume surgical care tomorrow  Tom Scott, Tom Scott, Tom Scott  [id number] Office: [phone] Mobile: [phone]  11/25/2020, 7:34 AM

## 2020-11-25 NOTE — Progress Notes (Signed)
PROGRESS NOTE    Tom Scott  [id number] DOB: 1956/01/21 DOA: 11/08/2020 PCP: Patient, No Pcp Per (Inactive)   Brief Narrative: Tom Scott is a 65 y.o. male with a history of atrial fibrillation, hypertension, diabetes mellitus, hyperlipidemia initially presented to [facility] secondary to chest pain and epigastric pain.  He developed septic shock requiring pressors.  Reported to have cholecystitis with choledocholithiasis and possible need for ERCP.  Also developed A. fib with RVR.  He was transferred to [facility] for ERCP and due to septic shock requiring vasopressor support.  He was admitted to the ICU.  General surgery and gastroenterology were consulted for management of his acute calculus cholecystitis.  IR consulted for percutaneous cholecystostomy and patient managed with empiric antibiotics.  While admitted, patient developed ATN requiring initiation of hemodialysis.  Also while admitted, patient was found to have evidence of ESBL E. coli bacteremia from outside hospital report.  Patient's antibiotics have been transitioned to meropenem to treat his bacteremia and cholecystitis.  Hospital course further complicated by septic arthritis of bilateral knees, s/p arthroscopic I&D in OR 6/4   Assessment & Plan:   Principal Problem:   Septic shock ([place]) Active Problems:   Pressure injury of skin   Protein-calorie malnutrition, severe   Encounter for central line placement   AKI (acute kidney injury) ([place])   RUQ abdominal pain   Biliary sepsis   Elevated liver function tests   Septic shock Present on admission.  Secondary to cholecystitis and bacteremia. Patient required ICU admission with vasopressor support. Weaned off vasopressors.  Septic shock resolved.  Acute calculus cholecystitis General surgery and IR consulted. IR placed percutaneous cholecystostomy on 5/20.  LFTs have almost normalized. Fluid culture significant for E. Coli, in addition to  rare bacteroides fragilis.  IR following regarding percutaneous drain, plan drain injection and possible repositioning/exchange of percutaneous cholecystostomy tube which patient declined on 6/2, IR schedule packed 6/3, and is rescheduled for 6/6.  GI was asked to reevaluate patient on 5/29 due to rising LFTs again, felt to be secondary to cholecystitis +/- amiodarone.  GI has since signed off on 5/31.  ID following and completed 14 days of meropenem.  ESBL E. Coli bacteremia Report received from OSH per PCCM that reports ESBL bacteremia and patient completed 14 days of IV meropenem on 6/3.  Surveillance blood cultures (5/29) without growth.  Developed fever of 101.2 overnight 5/31.  RUE venous Doppler negative for DVT or SVT.  Drain management per IR- next intervention likely 6/6.  Left IJ central line discontinued 6/4.  Septic arthritis bilateral knees Vs Pseudogout Flare 6/3, patient reported pain in both his knees, left greater than right.  Ultrasound of both knees showed effusions.  Orthopedics consulted, able to get only minimal fluid from both knees, not enough for cells or crystals.  Left knee synovial fluid gram stain showed rare gram-positive cocci.  ID initiated IV vancomycin.  Orthopedics performed arthroscopic bilateral knee I&D in OR on 6/4.  Bilateral knees synovial culture from bedside arthrocentesis 6/3: Negative to date even though left had shown rare gram-positive cocci on gram stain.  Bilateral knee synovial culture from OR 6/4: Negative to date.  Bilateral knee drains remain in place with minimal output, orthopedics plans to remove drains in a.m.  Right knee synovial fluid from surgery: Extracellular CPPD crystals.  Left knee negative for crystals.  If does not improve?  Role for intra-articular steroids versus systemic steroids.  Uric acid normal.  Acute metabolic encephalopathy  Secondary to uremia.  This had resolved but patient became confused post OR yesterday, likely related to  anesthesia.  Has bilateral mittens.  Better today per spouse.  Monitor.  Acute kidney injury Secondary to ATN secondary to septic shock. Patient started on hemodialysis on 5/23.  An uric.  No signs of renal recovery.  Nephrology following and TTS HD schedule while monitoring for renal recovery.  Atrial fibrillation with RVR Patient started on amiodarone for control. Currently rate controlled. On Eliquis for stroke prevention.  Rate controlled.  Asymptomatic anemia Patient with unknown baseline. Hemoglobin of 11 on admission with slow downtrend. No obvious source of bleeding. - On oral iron and s/p Aranesp.  Likely related to critical illness.  S/p 2 units PRBC across HD on 6/2.  Trend CBCs and transfuse if hemoglobin 7 g or below.  Anasarca Secondary to fluid resuscitation with IV fluids in setting of kidney failure in addition to albuminemia. Current I/O of +17.4L.  Volume management across HD.  Still substantial volume overload with pitting edema of legs and left upper extremity.  Essential hypertension Patient is on amlodipine, losartan and metoprolol as an outpatient. Antihypertensives held secondary to hypotension and septic shock. Blood pressure now better controlled and stable. Could gradually start home regimen if blood pressure start to worsen.  Diabetes mellitus, type 2 Patient is on metformin as an outpatient.  Mildly uncontrolled.  Continue SSI and may need adjustment.  Hyperlipidemia -Continue Crestor  Severe malnutrition History of gastric bypass. Worse secondary to critical illness. NG tube inserted on 5/25 and patient started on tube feeds on 5/25. NG tube removed by patient on 5/28. -Oral intake -Dietitian recommendations:  Trial Boost Plus chocolate TID- Each supplement provides 360kcal and 14g protein.    30 ml ProSource Plus BID, each supplement provides 100 kcals and 15 grams protein.   MVI with minerals BID per tube  500 mg calcium carbonate TID per  tube  Supplement Vitamin C 500 mg BID   Pressure injury Medial coccyx, not POA  Diarrhea Likely multifactorial due to gallbladder disease and antibiotics.  Communicated with ID and okay to use Imodium, initiated trial.  Improved.  Hypokalemia Likely due to GI losses and dialysis.  Management per nephrology.  Right upper extremity pain and swelling ?  Related to asymmetric edema complicating underlying arthritis.  Venous Doppler negative for DVT or SVT.  Elevate and monitor.  Resolved   DVT prophylaxis: Eliquis Code Status:   Code Status: Full Code Family Communication: Discussed in detail with patient spouse at bedside 6/5, updated care and answered all questions. Disposition Plan: Discharge likely in several days pending management of AKI vs need for outpatient HD, therapy recommendations   Consultants:   PCCM  Gastroenterology  General surgery  Interventional radiology  Nephrology  Orthopedic  Procedures:   As noted above.  Bilateral knee aspiration by orthopedics 6/3  Bilateral knee arthroscopic I&D in OR by orthopedic 6/4  Antimicrobials:  Flagyl  Cefepime  Meropenem-completed 6/3   Subjective: Was sleeping but arousable.  Oriented x2.  Per spouse at bedside, confusion is better today than yesterday.  Patient frustrated by hand mittens.   Objective: Vitals:   11/25/20 0409 11/25/20 0649 11/25/20 0859 11/25/20 1300  BP:   108/67 111/63  Pulse:   91 90  Resp:   17 17  Temp:  98.4 F (36.9 C) 98.2 F (36.8 C) 98.4 F (36.9 C)  TempSrc:   Oral Oral  SpO2:   93% 96%  Weight:  120.6 kg     Height:        Intake/Output Summary (Last 24 hours) at 11/25/2020 1627 Last data filed at 11/25/2020 1300 Gross per 24 hour  Intake 540 ml  Output --  Net 540 ml   Filed Weights   11/24/20 1028 11/24/20 1158 11/25/20 0409  Weight: 120 kg 121 kg 120.6 kg    Examination:  General exam: Middle-age male, moderately built and obese lying comfortably  supine in bed undergoing HD. Respiratory system: Clear to auscultation.  No increased work of breathing. Cardiovascular system: S1 and S2 heard, irregularly irregular.  No JVD or murmurs.  1+ pitting bilateral leg edema up to lower thighs.  Right upper extremity swelling resolved.  Telemetry personally reviewed: A. fib with controlled ventricular rate. Gastrointestinal system: Abdomen is nondistended, soft and nontender. No organomegaly or masses felt. Normal bowel sounds heard. Central nervous system: Alert and oriented. No focal neurological deficits. Extremities: Symmetric 5 x 5 power.  However overall appears weak and deconditioned.  Has surgical dressing over bilateral knees, C/D/I.  Bloody fluid in bilateral knee drains, small amount. Skin: No rashes, lesions or ulcers Psychiatry: Judgement and insight impaired. Mood & affect flat.       Data Reviewed: I have personally reviewed following labs and imaging studies  CBC Lab Results  Component Value Date   WBC 14.3 (H) 11/24/2020   RBC 2.90 (L) 11/24/2020   HGB 7.9 (L) 11/24/2020   HCT 25.0 (L) 11/24/2020   MCV 86.2 11/24/2020   MCH 27.2 11/24/2020   PLT 434 (H) 11/24/2020   MCHC 31.6 11/24/2020   RDW 20.9 (H) 99991111     Last metabolic panel Lab Results  Component Value Date   NA 132 (L) 11/24/2020   K 4.7 11/24/2020   CL 100 11/24/2020   CO2 19 (L) 11/24/2020   BUN 41 (H) 11/24/2020   CREATININE 4.16 (H) 11/24/2020   GLUCOSE 270 (H) 11/24/2020   GFRNONAA 15 (L) 11/24/2020   CALCIUM 8.1 (L) 11/24/2020   PHOS 5.9 (H) 11/24/2020   PROT 5.5 (L) 11/21/2020   ALBUMIN 2.0 (L) 11/24/2020   BILITOT 1.3 (H) 11/21/2020   ALKPHOS 139 (H) 11/21/2020   AST 48 (H) 11/21/2020   ALT 35 11/21/2020   ANIONGAP 13 11/24/2020    CBG (last 3)  Recent Labs    11/24/20 2139 11/25/20 0743 11/25/20 1156  GLUCAP 269* 212* 210*     GFR: Estimated Creatinine Clearance: 22.4 mL/min (A) (by C-G formula based on SCr of 4.16 mg/dL  (H)).  Coagulation Profile: Recent Labs  Lab 11/19/20 0327  INR 1.8*    Recent Results (from the past 240 hour(s))  Culture, blood (routine x 2)     Status: None   Collection Time: 11/18/20  4:57 PM   Specimen: BLOOD RIGHT HAND  Result Value Ref Range Status   Specimen Description BLOOD RIGHT HAND  Final   Special Requests   Final    BOTTLES DRAWN AEROBIC AND ANAEROBIC Blood Culture adequate volume   Culture   Final    NO GROWTH 5 DAYS Performed at [facility], [place] [address] [place], [place] [address]    Report Status 11/23/2020 FINAL  Final  Culture, blood (routine x 2)     Status: None   Collection Time: 11/18/20  4:59 PM   Specimen: BLOOD LEFT HAND  Result Value Ref Range Status   Specimen Description BLOOD LEFT HAND  Final   Special Requests  Final    BOTTLES DRAWN AEROBIC AND ANAEROBIC Blood Culture adequate volume   Culture   Final    NO GROWTH 5 DAYS Performed at [facility], [place] [address] [place], [place] [address]    Report Status 11/23/2020 FINAL  Final  Body fluid culture w Gram Stain     Status: None (Preliminary result)   Collection Time: 11/23/20  7:31 PM   Specimen: Body Fluid  Result Value Ref Range Status   Specimen Description FLUID SYNOVIAL RIGHT KNEE  Final   Special Requests Immunocompromised  Final   Gram Stain   Final    RARE WBC PRESENT, PREDOMINANTLY MONONUCLEAR NO ORGANISMS SEEN    Culture   Final    NO GROWTH 2 DAYS Performed at [facility], [address] [place], [place] [address]    Report Status PENDING  Incomplete  Body fluid culture w Gram Stain     Status: None (Preliminary result)   Collection Time: 11/23/20  7:32 PM   Specimen: Body Fluid  Result Value Ref Range Status   Specimen Description FLUID SYNOVIAL LEFT KNEE  Final   Special Requests Immunocompromised  Final   Gram Stain   Final    FEW WBC PRESENT,BOTH PMN AND MONONUCLEAR RARE GRAM POSITIVE COCCI    Culture   Final    NO GROWTH 2  DAYS Performed at [facility], [place] [address] [place], [place] [address]    Report Status PENDING  Incomplete  Aerobic/Anaerobic Culture w Gram Stain (surgical/deep wound)     Status: None (Preliminary result)   Collection Time: 11/24/20 12:34 PM   Specimen: PATH Cytology Misc. fluid; Body Fluid  Result Value Ref Range Status   Specimen Description FLUID  Final   Special Requests RIGHT KNEE SYNOVIAL FLUID SPEC A  Final   Gram Stain   Final    FEW WBC PRESENT,BOTH PMN AND MONONUCLEAR NO ORGANISMS SEEN    Culture   Final    NO GROWTH < 24 HOURS Performed at [facility], [place] [address], [place], [place] [address]    Report Status PENDING  Incomplete  Aerobic/Anaerobic Culture w Gram Stain (surgical/deep wound)     Status: None (Preliminary result)   Collection Time: 11/24/20 12:36 PM   Specimen: Fluid  Result Value Ref Range Status   Specimen Description FLUID  Final   Special Requests LEFT KNEE SYNOVIAL SPEC B  Final   Gram Stain   Final    MODERATE WBC PRESENT,BOTH PMN AND MONONUCLEAR NO ORGANISMS SEEN    Culture   Final    NO GROWTH < 24 HOURS Performed at [facility], [place] [address] [place], [place] [address]    Report Status PENDING  Incomplete        Radiology Studies: DG Knee 1-2 Views Left  Result Date: 11/23/2020 CLINICAL DATA:  Pain. EXAM: LEFT KNEE - 1-2 VIEW COMPARISON:  None. FINDINGS: No evidence of fracture, dislocation. Small joint effusion. 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 No aggressive appearing focal bone abnormality. Vascular calcifications. Diffuse mild subcutaneus soft tissue edema. IMPRESSION: 1. 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 2. No acute displaced fracture or dislocation of the left knee on two-view radiograph. Electronically Signed   By: Iven Finn M.D.   On: 11/23/2020 22:17   DG Knee 1-2 Views Right  Result Date:  11/23/2020 CLINICAL DATA:  Pain. EXAM:  RIGHT KNEE - 1-2 VIEW COMPARISON:  None. FINDINGS: No evidence of fracture, dislocation. Likely small joint effusion. Severe tricompartmental degenerative changes of the right knee. No aggressive appearing focal bone abnormality. Severe vascular calcifications. Soft tissues are unremarkable. IMPRESSION: 1. Severe tricompartmental degenerative changes of the right knee. 2. Likely small joint effusion with limited evaluation due to positioning. 3. No acute displaced fracture or dislocation of the right knee on two-view radiograph. Electronically Signed   By: Iven Finn M.D.   On: 11/23/2020 22:19        Scheduled Meds: . (feeding supplement) PROSource Plus  30 mL Oral BID BM  . amiodarone  200 mg Oral Daily  . apixaban  5 mg Oral BID  . vitamin C  500 mg Oral BID  . calcium carbonate  1 tablet Oral TID  . Chlorhexidine Gluconate Cloth  6 each Topical Q0600  . cholecalciferol  1,000 Units Oral Daily  . darbepoetin (ARANESP) injection - DIALYSIS  40 mcg Intravenous Q Sat-HD  . docusate sodium  100 mg Oral BID  . insulin aspart  0-6 Units Subcutaneous TID WC  . iron polysaccharides  150 mg Oral Daily  . lactose free nutrition  237 mL Oral TID WC  . lidocaine  1 patch Transdermal Q24H  . multivitamin with minerals  1 tablet Oral BID  . rosuvastatin  10 mg Oral Daily  . sodium chloride flush  10-40 mL Intracatheter Q12H  . sodium chloride flush  5 mL Intracatheter Q8H  . vancomycin variable dose per unstable renal function (pharmacist dosing)   Does not apply See admin instructions  . zinc sulfate  220 mg Oral Daily   Continuous Infusions: . sodium chloride       LOS: 17 days    [name], MD, [place], [facility]. [facility]  To contact the attending provider between 7A-7P or the covering provider during after hours 7P-7A, please log into the web site [email or web address] and access using universal [place] password for that web site. If you  do not have the password, please call the hospital operator.

## 2020-11-25 NOTE — Progress Notes (Addendum)
Upon checking on patient, noted that patient pulled out his PIV (2nd time he pulled out his IV this shift). He also took out his Tele leads off and was holding on to HD cath. Patient was reoriented. Placed soft mittens to bilateral hands. Updated spouse(Kim) about the incident. M. Denny,FNP made aware. Will continue to monitor.

## 2020-11-25 NOTE — Progress Notes (Signed)
Patient ID: Tom Scott, male   DOB: 20-Aug-1955, 65 y.o.   MRN: [id number] [name] Progress Note   Assessment/ Plan:   1. Acute kidney Injury: Secondary to septic shock/ATN and likely exacerbated by hemodynamic insult from atrial fibrillation with RVR.  Started on hemodialysis on 5/23 and at this time he remains anuric and without any evidence of renal recovery.  Continue hemodialysis on a TTS schedule while monitoring for renal recovery. 2.  Calculus cholecystitis with E. coli bacteremia: Status post percutaneous cholecystostomy on 5/20 with suspected amiodarone associated elevation of LFTs.  3.  Atrial fibrillation with rapid ventricular response: Rate control fair on amiodarone, likely compounded by infection from septic arthritis. 4.  Microcytic anemia: On oral iron and status post Aranesp-likely compounded by acute/critical illness.  PRBC transfusion when hemoglobin <7.0. 5.  Physical deconditioning: Secondary to acute illness and now limited by bilateral knee pain/swelling from septic arthritis. 6.  Septic arthritis: Seen by orthopedic surgery for concerns of knee swelling 2 days ago with aspiration revealing cloudy aspirate in both knees with few GPC from the left knee; consequently underwent arthroscopic debridement of both knees yesterday.  Surgical cultures pending while on intravenous vancomycin.  Subjective:   Fever curve trending down since yesterday morning.  He reports no clear improvement of knee discomfort.   Objective:   BP 99/66 (BP Location: Right Arm)   Pulse (!) 102 Comment: radial  Temp 98.4 F (36.9 C) Comment: M.Sharlet Salina made aware thru secure chat  Resp 18   Ht '5\' 10"'$  (1.778 m)   Wt 120.6 kg   SpO2 93%   BMI 38.15 kg/m   Intake/Output Summary (Last 24 hours) at 11/25/2020 0851 Last data filed at 11/25/2020 0645 Gross per 24 hour  Intake 1893 ml  Output 1060 ml  Net 833 ml   Weight change: -1.1 kg  Physical Exam: Gen: Resting comfortably in  bed,  CVS: Pulse irregularly irregular tachycardia, S1 and S2 normal. Resp: Anteriorly clear to auscultation, no rales/rhonchi.  Left IJ temporary dialysis catheter and right IJ TDC. Abd: Soft, obese, cholecystostomy tube in situ right upper quadrant Ext: 1-2+ pitting edema over lower extremities.  Bilateral knees in wraps/dressings with drain in situ.  Imaging: DG Knee 1-2 Views Left  Result Date: 11/23/2020 CLINICAL DATA:  Pain. EXAM: LEFT KNEE - 1-2 VIEW COMPARISON:  None. FINDINGS: No evidence of fracture, dislocation. Small joint effusion. 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 No aggressive appearing focal bone abnormality. Vascular calcifications. Diffuse mild subcutaneus soft tissue edema. IMPRESSION: 1. 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 2. No acute displaced fracture or dislocation of the left knee on two-view radiograph. Electronically Signed   By: Iven Finn M.D.   On: 11/23/2020 22:17   DG Knee 1-2 Views Right  Result Date: 11/23/2020 CLINICAL DATA:  Pain. EXAM: RIGHT KNEE - 1-2 VIEW COMPARISON:  None. FINDINGS: No evidence of fracture, dislocation. Likely small joint effusion. Severe tricompartmental degenerative changes of the right knee. No aggressive appearing focal bone abnormality. Severe vascular calcifications. Soft tissues are unremarkable. IMPRESSION: 1. Severe tricompartmental degenerative changes of the right knee. 2. Likely small joint effusion with limited evaluation due to positioning. 3. No acute displaced fracture or dislocation of the right knee on two-view radiograph. Electronically Signed   By: Iven Finn M.D.   On: 11/23/2020 22:19   [place] RT LOWER EXTREM LTD SOFT TISSUE NON VASCULAR  Result Date: 11/23/2020 CLINICAL DATA:  Bilateral knee pain x5 months with possible joint effusion. EXAM: ULTRASOUND BILATERAL LOWER EXTREMITY LIMITED TECHNIQUE: Ultrasound examination of  the RIGHT and LEFT knees was performed in the area of clinical concern. COMPARISON:  None. FINDINGS: Joint Space: A 7.2 cm x 6.9 cm x 2.0 cm area of complex hypoechogenicity is seen along the anterior aspect of the right knee. A similar appearing 5.7 cm x 2.0 cm x 5.6 cm area of complex hypoechogenicity is seen along the anterior aspect of the left knee. No abnormal flow is noted within these regions on color Doppler evaluation. Muscles: Normal. Tendons: Normal Other Soft Tissue Structures: Diffusely edematous soft tissues are noted bilaterally. IMPRESSION: 1. Bilateral cellulitis with additional findings that may represent large, bilateral complex knee effusions. MRI correlation is recommended, as soft tissue abscesses cannot be excluded. Electronically Signed   By: Virgina Norfolk M.D.   On: 11/23/2020 16:55   [place] LT LOWER EXTREM LTD SOFT TISSUE NON VASCULAR  Result Date: 11/23/2020 CLINICAL DATA:  Bilateral knee pain with possible joint effusions. EXAM: ULTRASOUND BILATERAL LOWER EXTREMITY LIMITED TECHNIQUE: Ultrasound examination of the RIGHT and LEFT knees was performed in the area of clinical concern. COMPARISON:  None. FINDINGS: Joint Space: A 7.2 cm x 6.9 cm x 2.0 cm area of complex hypoechogenicity is seen along the anterior aspect of the right knee. A similar appearing 5.7 cm x 2.0 cm x 5.6 cm area of complex hypoechogenicity is seen along the anterior aspect of the left knee. No abnormal flow is noted within these regions on color Doppler evaluation. Muscles: Normal. Tendons: Normal Other Soft Tissue Structures: Diffusely edematous soft tissues are noted bilaterally. IMPRESSION: 1. Bilateral cellulitis with additional findings that may represent large, bilateral complex knee effusions. MRI correlation is recommended, as soft tissue abscesses cannot be excluded. Electronically Signed   By: Virgina Norfolk M.D.   On: 11/23/2020 16:56   Labs: BMET Recent Labs  Lab 11/19/20 0327 11/20/20 0944  11/21/20 0245 11/22/20 0059 11/24/20 0237 11/24/20 2203  NA 132* 127* 130* 129* 132* 132*  K 3.2* 3.1* 2.9* 3.1* 3.1* 4.7  CL 98 96* 93* 95* 96* 100  CO2 22 19* '24 22 23 '$ 19*  GLUCOSE 161* 178* 160* 160* 149* 270*  BUN 65* 81* 42* 54* 56* 41*  CREATININE 6.04* 7.34* 4.78* 5.65* 5.70* 4.16*  CALCIUM 8.0* 7.8* 8.1* 7.9* 8.1* 8.1*  PHOS 5.2*  --   --  6.4*  --  5.9*   CBC Recent Labs  Lab 11/22/20 0059 11/23/20 0357 11/24/20 0237 11/24/20 2203  WBC 12.0* 14.5* 12.1* 14.3*  HGB 7.0* 8.0* 7.7* 7.9*  HCT 21.6* 24.9* 24.5* 25.0*  MCV 81.5 84.7 84.5 86.2  PLT 379 374 402* 434*    Medications:    . (feeding supplement) PROSource Plus  30 mL Oral BID BM  . amiodarone  200 mg Oral Daily  . apixaban  5 mg Oral BID  . vitamin C  500 mg Oral BID  . calcium carbonate  1 tablet Oral TID  . Chlorhexidine Gluconate Cloth  6 each Topical Q0600  . cholecalciferol  1,000 Units Oral Daily  . darbepoetin (ARANESP) injection - DIALYSIS  40 mcg Intravenous Q Sat-HD  . docusate sodium  100 mg Oral BID  . insulin aspart  0-6 Units Subcutaneous TID WC  . iron polysaccharides  150 mg Oral Daily  . lactose free nutrition  237 mL Oral TID WC  . lidocaine  1 patch Transdermal Q24H  . multivitamin  with minerals  1 tablet Oral BID  . rosuvastatin  10 mg Oral Daily  . sodium chloride flush  10-40 mL Intracatheter Q12H  . sodium chloride flush  5 mL Intracatheter Q8H  . vancomycin variable dose per unstable renal function (pharmacist dosing)   Does not apply See admin instructions  . zinc sulfate  220 mg Oral Daily   Elmarie Shiley, MD 11/25/2020, 8:51 AM

## 2020-11-26 ENCOUNTER — Inpatient Hospital Stay (HOSPITAL_COMMUNITY): Payer: Medicare (Managed Care)

## 2020-11-26 DIAGNOSIS — A419 Sepsis, unspecified organism: Secondary | ICD-10-CM | POA: Diagnosis not present

## 2020-11-26 DIAGNOSIS — M25461 Effusion, right knee: Secondary | ICD-10-CM | POA: Diagnosis not present

## 2020-11-26 DIAGNOSIS — M25462 Effusion, left knee: Secondary | ICD-10-CM

## 2020-11-26 DIAGNOSIS — R6521 Severe sepsis with septic shock: Secondary | ICD-10-CM | POA: Diagnosis not present

## 2020-11-26 HISTORY — PX: IR SINUS/FIST TUBE CHK-NON GI: IMG673

## 2020-11-26 LAB — GLUCOSE, CAPILLARY
Glucose-Capillary: 184 mg/dL — ABNORMAL HIGH (ref 70–99)
Glucose-Capillary: 173 mg/dL — ABNORMAL HIGH (ref 70–99)
Glucose-Capillary: 174 mg/dL — ABNORMAL HIGH (ref 70–99)
Glucose-Capillary: 200 mg/dL — ABNORMAL HIGH (ref 70–99)

## 2020-11-26 MED ORDER — LIDOCAINE HCL (PF) 1 % IJ SOLN
INTRAMUSCULAR | Status: AC
  Filled 2020-11-26: qty 30

## 2020-11-26 MED ORDER — COLLAGENASE 250 UNIT/GM EX OINT
TOPICAL_OINTMENT | Freq: Every day | CUTANEOUS | Status: DC
  Filled 2020-11-26: qty 30

## 2020-11-26 MED ORDER — VANCOMYCIN HCL 1000 MG/200ML IV SOLN
1000.0000 mg | INTRAVENOUS | Status: DC
  Filled 2020-11-26: qty 200

## 2020-11-26 MED ORDER — IOHEXOL 300 MG/ML  SOLN: 50.0000 mL | Freq: Once | INTRAMUSCULAR | Status: DC | PRN

## 2020-11-26 NOTE — Progress Notes (Signed)
PROGRESS NOTE    Minton Ravenscroft  [id number] DOB: 1956/05/09 DOA: 11/08/2020 PCP: Patient, No Pcp Per (Inactive)   Brief Narrative: Tom Scott is a 65 y.o. male with a history of atrial fibrillation, hypertension, diabetes mellitus, hyperlipidemia initially presented to [facility] secondary to chest pain and epigastric pain.  He developed septic shock requiring pressors.  Reported to have cholecystitis with choledocholithiasis and possible need for ERCP.  Also developed A. fib with RVR.  He was transferred to [facility] for ERCP and due to septic shock requiring vasopressor support.  He was admitted to the ICU.  General surgery and gastroenterology were consulted for management of his acute calculus cholecystitis.  IR consulted for percutaneous cholecystostomy and patient managed with empiric antibiotics.  While admitted, patient developed AKI/ATN requiring initiation of hemodialysis and no signs of renal recovery.  Also while admitted, patient was found to have evidence of ESBL E. coli bacteremia from outside hospital report and has completed 14-day course of meropenem, has percutaneous cholecystostomy.  Hospital course further complicated by septic arthritis versus pseudogout flare of bilateral knees, s/p arthroscopic I&D in OR 6/4   Assessment & Plan:   Principal Problem:   Septic shock ([place]) Active Problems:   Pressure injury of skin   Protein-calorie malnutrition, severe   Encounter for central line placement   AKI (acute kidney injury) ([place])   RUQ abdominal pain   Biliary sepsis   Elevated liver function tests   Septic shock Present on admission.  Secondary to cholecystitis and bacteremia. Patient required ICU admission with vasopressor support. Weaned off vasopressors.  Septic shock resolved.  Acute calculus cholecystitis General surgery and IR consulted. IR placed percutaneous cholecystostomy on 5/20.  LFTs have almost normalized. Fluid culture  significant for E. Coli, in addition to rare bacteroides fragilis.  IR following regarding percutaneous drain, plan drain injection and possible repositioning/exchange of percutaneous cholecystostomy tube on 6/6.  GI was asked to reevaluate patient on 5/29 due to rising LFTs again, felt to be secondary to cholecystitis +/- amiodarone.  GI has since signed off on 5/31.  ID following and completed 14 days of meropenem.  No further antibiotics.  ESBL E. Coli bacteremia Report received from OSH per PCCM that reports ESBL bacteremia and patient completed 14 days of IV meropenem on 6/3.  Surveillance blood cultures (5/29) without growth.  Developed fever of 101.2 overnight 5/31.  RUE venous Doppler negative for DVT or SVT.  Drain management per IR- next intervention likely 6/6.  Left IJ central line discontinued 6/4.  Septic arthritis bilateral knees Vs Pseudogout Flare 6/3, patient reported pain in both his knees, left greater than right.  Ultrasound of both knees showed effusions.  Orthopedics consulted, able to get only minimal fluid from both knees, not enough for cells or crystals.  Left knee synovial fluid gram stain showed rare gram-positive cocci.  ID initiated IV vancomycin.  Orthopedics performed arthroscopic bilateral knee I&D in OR on 6/4.  Bilateral knees synovial culture from bedside arthrocentesis 6/3: Negative to date even though left had shown rare gram-positive cocci on gram stain.  Bilateral knee synovial culture from OR 6/4: Negative to date.  Bilateral knee drains removed 6/6.  Right knee synovial fluid from surgery: Extracellular CPPD crystals.  Left knee negative for crystals.  If does not improve?  Role for intra-articular steroids versus systemic steroids.  Uric acid normal.  WBAT.  Acute metabolic encephalopathy Secondary to uremia.  This had resolved but patient became confused post  OR on 6/4.  This has also resolved.  Acute kidney injury Secondary to ATN secondary to septic shock.  Patient started on hemodialysis on 5/23.  Anuric.  No signs of renal recovery.  Nephrology following and TTS HD schedule while monitoring for renal recovery.  Atrial fibrillation with RVR Patient started on amiodarone for control. On Eliquis for stroke prevention.  Rate controlled.  Asymptomatic anemia Patient with unknown baseline. Hemoglobin of 11 on admission with slow downtrend. No obvious source of bleeding. - On oral iron and s/p Aranesp.  Likely related to critical illness.  S/p 2 units PRBC across HD on 6/2.  Trend CBCs and transfuse if hemoglobin 7 g or below.  Monitor labs across HD.  Anasarca Secondary to fluid resuscitation with IV fluids in setting of kidney failure in addition to albuminemia. Current I/O of +17.4L.  Volume management across HD.  Still substantial volume overload with pitting edema of legs and left upper extremity.  Essential hypertension Patient is on amlodipine, losartan and metoprolol as an outpatient. Antihypertensives held secondary to hypotension and septic shock.  Blood pressures either normal or soft.  Continue to hold antihypertensives.  Diabetes mellitus, type 2 Patient is on metformin as an outpatient.  Mildly uncontrolled.  Continue SSI and may need adjustment.  Hyperlipidemia -Continue Crestor  Severe malnutrition History of gastric bypass. Worse secondary to critical illness. NG tube inserted on 5/25 and patient started on tube feeds on 5/25. NG tube removed by patient on 5/28. -Oral intake -Dietitian recommendations:  Trial Boost Plus chocolate TID- Each supplement provides 360kcal and 14g protein.    30 ml ProSource Plus BID, each supplement provides 100 kcals and 15 grams protein.   MVI with minerals BID per tube  500 mg calcium carbonate TID per tube  Supplement Vitamin C 500 mg BID   Pressure injury Medial coccyx, not POA  Pressure Injury 11/12/20 Sacrum Medial Unstageable - Full thickness tissue loss in which the base of the  injury is covered by slough (yellow, tan, gray, green or brown) and/or eschar (tan, brown or black) in the wound bed. (Active)  11/12/20 2000  Location: Sacrum  Location Orientation: Medial  Staging: Unstageable - Full thickness tissue loss in which the base of the injury is covered by slough (yellow, tan, gray, green or brown) and/or eschar (tan, brown or black) in the wound bed.  Wound Description (Comments):   Present on Admission: No    Diarrhea Likely multifactorial due to gallbladder disease and antibiotics.  Communicated with ID and okay to use Imodium, initiated trial.  Improved.  Hypokalemia Likely due to GI losses and dialysis.  Management per nephrology.  Right upper extremity pain and swelling ?  Related to asymmetric edema complicating underlying arthritis.  Venous Doppler negative for DVT or SVT.  Elevate and monitor.  Resolved   DVT prophylaxis: Eliquis Code Status:   Code Status: Full Code Family Communication: Discussed in detail with patient spouse at bedside 6/6, updated care and answered all questions. Disposition Plan: Discharge likely in several days pending management of AKI vs need for outpatient HD, therapy recommendations   Consultants:   PCCM  Gastroenterology  General surgery  Interventional radiology  Nephrology  Orthopedic  Procedures:   As noted above.  Bilateral knee aspiration by orthopedics 6/3  Bilateral knee arthroscopic I&D in OR by orthopedic 6/4  Antimicrobials:  Flagyl  Cefepime  Meropenem-completed 6/3   Subjective: Patient evaluated along with ID team, [place] RN along with spouse at bedside.  Mental status changes resolved and patient back to baseline.  No diarrhea.  Still has bilateral knee pains but not as bad as on Friday.  Objective: Vitals:   11/25/20 2043 11/26/20 0400 11/26/20 0431 11/26/20 1236  BP: 103/66 112/64  100/66  Pulse: 92 86  96  Resp: '18 20  18  '$ Temp: 98.4 F (36.9 C) 98.1 F (36.7 C)  98.2 F  (36.8 C)  TempSrc: Oral Oral  Oral  SpO2: 91% 94%  94%  Weight:   121.3 kg   Height:        Intake/Output Summary (Last 24 hours) at 11/26/2020 1532 Last data filed at 11/26/2020 1300 Gross per 24 hour  Intake 320 ml  Output 100 ml  Net 220 ml   Filed Weights   11/24/20 1158 11/25/20 0409 11/26/20 0431  Weight: 121 kg 120.6 kg 121.3 kg    Examination:  General exam: Middle-age male, moderately built and obese lying comfortably supine in bed undergoing HD.  Appears very deconditioned. Respiratory system: Clear to auscultation.  No increased work of breathing. Cardiovascular system: S1 and S2 heard, irregularly irregular.  No JVD or murmurs.  2+ pitting bilateral leg edema.  Telemetry personally reviewed: A. fib with controlled ventricular rate. Gastrointestinal system: Abdomen is nondistended, soft and nontender. No organomegaly or masses felt. Normal bowel sounds heard. Central nervous system: Alert and oriented. No focal neurological deficits. Extremities: Symmetric 5 x 5 power.  However overall appears weak and deconditioned.  Has surgical dressing over bilateral knees, C/D/I.  Bloody fluid in bilateral knee drains, small amount.  Patient still had drains this morning. Skin: No rashes, lesions or ulcers Psychiatry: Judgement and insight improved and appears intact. Mood & affect pleasant and cooperative.      Data Reviewed: I have personally reviewed following labs and imaging studies  CBC Lab Results  Component Value Date   WBC 14.3 (H) 11/24/2020   RBC 2.90 (L) 11/24/2020   HGB 7.9 (L) 11/24/2020   HCT 25.0 (L) 11/24/2020   MCV 86.2 11/24/2020   MCH 27.2 11/24/2020   PLT 434 (H) 11/24/2020   MCHC 31.6 11/24/2020   RDW 20.9 (H) 99991111     Last metabolic panel Lab Results  Component Value Date   NA 132 (L) 11/24/2020   K 4.7 11/24/2020   CL 100 11/24/2020   CO2 19 (L) 11/24/2020   BUN 41 (H) 11/24/2020   CREATININE 4.16 (H) 11/24/2020   GLUCOSE 270 (H)  11/24/2020   GFRNONAA 15 (L) 11/24/2020   CALCIUM 8.1 (L) 11/24/2020   PHOS 5.9 (H) 11/24/2020   PROT 5.5 (L) 11/21/2020   ALBUMIN 2.0 (L) 11/24/2020   BILITOT 1.3 (H) 11/21/2020   ALKPHOS 139 (H) 11/21/2020   AST 48 (H) 11/21/2020   ALT 35 11/21/2020   ANIONGAP 13 11/24/2020    CBG (last 3)  Recent Labs    11/25/20 2041 11/26/20 0745 11/26/20 1231  GLUCAP 216* 184* 173*     GFR: Estimated Creatinine Clearance: 22.5 mL/min (A) (by C-G formula based on SCr of 4.16 mg/dL (H)).  Coagulation Profile: No results for input(s): INR, PROTIME in the last 168 hours.  Recent Results (from the past 240 hour(s))  Culture, blood (routine x 2)     Status: None   Collection Time: 11/18/20  4:57 PM   Specimen: BLOOD RIGHT HAND  Result Value Ref Range Status   Specimen Description BLOOD RIGHT HAND  Final   Special Requests   Final  BOTTLES DRAWN AEROBIC AND ANAEROBIC Blood Culture adequate volume   Culture   Final    NO GROWTH 5 DAYS Performed at [facility], [place] [address], [place], [place] [address]    Report Status 11/23/2020 FINAL  Final  Culture, blood (routine x 2)     Status: None   Collection Time: 11/18/20  4:59 PM   Specimen: BLOOD LEFT HAND  Result Value Ref Range Status   Specimen Description BLOOD LEFT HAND  Final   Special Requests   Final    BOTTLES DRAWN AEROBIC AND ANAEROBIC Blood Culture adequate volume   Culture   Final    NO GROWTH 5 DAYS Performed at [facility], [place] [address], [place], [place] [address]    Report Status 11/23/2020 FINAL  Final  Body fluid culture w Gram Stain     Status: None (Preliminary result)   Collection Time: 11/23/20  7:31 PM   Specimen: Body Fluid  Result Value Ref Range Status   Specimen Description FLUID SYNOVIAL RIGHT KNEE  Final   Special Requests Immunocompromised  Final   Gram Stain   Final    RARE WBC PRESENT, PREDOMINANTLY MONONUCLEAR NO ORGANISMS SEEN    Culture   Final    NO GROWTH 3  DAYS Performed at [facility], [address], [place], [place] [address]    Report Status PENDING  Incomplete  Body fluid culture w Gram Stain     Status: None (Preliminary result)   Collection Time: 11/23/20  7:32 PM   Specimen: Body Fluid  Result Value Ref Range Status   Specimen Description FLUID SYNOVIAL LEFT KNEE  Final   Special Requests Immunocompromised  Final   Gram Stain   Final    FEW WBC PRESENT,BOTH PMN AND MONONUCLEAR RARE GRAM POSITIVE COCCI    Culture   Final    NO GROWTH 3 DAYS Performed at [facility], [place] [address], [place], [place] [address]    Report Status PENDING  Incomplete  Aerobic/Anaerobic Culture w Gram Stain (surgical/deep wound)     Status: None (Preliminary result)   Collection Time: 11/24/20 12:34 PM   Specimen: PATH Cytology Misc. fluid; Body Fluid  Result Value Ref Range Status   Specimen Description FLUID  Final   Special Requests RIGHT KNEE SYNOVIAL FLUID SPEC A  Final   Gram Stain   Final    FEW WBC PRESENT,BOTH PMN AND MONONUCLEAR NO ORGANISMS SEEN    Culture   Final    NO GROWTH 2 DAYS Performed at [facility], [address] [place], [place] [address]    Report Status PENDING  Incomplete  Aerobic/Anaerobic Culture w Gram Stain (surgical/deep wound)     Status: None (Preliminary result)   Collection Time: 11/24/20 12:36 PM   Specimen: Fluid  Result Value Ref Range Status   Specimen Description FLUID  Final   Special Requests LEFT KNEE SYNOVIAL SPEC B  Final   Gram Stain   Final    MODERATE WBC PRESENT,BOTH PMN AND MONONUCLEAR NO ORGANISMS SEEN    Culture   Final    NO GROWTH 2 DAYS Performed at [facility], [place] [address] [place], [place] [address]    Report Status PENDING  Incomplete  Culture, blood (Routine X 2) w Reflex to ID Panel     Status: None (Preliminary result)   Collection Time: 11/24/20 10:03 PM   Specimen: BLOOD  Result Value Ref  Range Status   Specimen Description BLOOD SITE NOT  SPECIFIED  Final   Special Requests   Final    BOTTLES DRAWN AEROBIC ONLY Blood Culture results may not be optimal due to an inadequate volume of blood received in culture bottles   Culture   Final    NO GROWTH 1 DAY Performed at [facility], [place] [address], [place], [place] [address]    Report Status PENDING  Incomplete  Culture, blood (Routine X 2) w Reflex to ID Panel     Status: None (Preliminary result)   Collection Time: 11/24/20 10:03 PM   Specimen: BLOOD  Result Value Ref Range Status   Specimen Description BLOOD SITE NOT SPECIFIED  Final   Special Requests   Final    BOTTLES DRAWN AEROBIC AND ANAEROBIC Blood Culture results may not be optimal due to an inadequate volume of blood received in culture bottles   Culture   Final    NO GROWTH 1 DAY Performed at [facility], [place] [address], [place], [place] [address]    Report Status PENDING  Incomplete        Radiology Studies: No results found.      Scheduled Meds: . (feeding supplement) PROSource Plus  30 mL Oral BID BM  . amiodarone  200 mg Oral Daily  . apixaban  5 mg Oral BID  . vitamin C  500 mg Oral BID  . calcium carbonate  1 tablet Oral TID  . Chlorhexidine Gluconate Cloth  6 each Topical Q0600  . cholecalciferol  1,000 Units Oral Daily  . collagenase   Topical Daily  . darbepoetin (ARANESP) injection - DIALYSIS  40 mcg Intravenous Q Sat-HD  . docusate sodium  100 mg Oral BID  . insulin aspart  0-6 Units Subcutaneous TID WC  . iron polysaccharides  150 mg Oral Daily  . lactose free nutrition  237 mL Oral TID WC  . lidocaine  1 patch Transdermal Q24H  . multivitamin with minerals  1 tablet Oral BID  . rosuvastatin  10 mg Oral Daily  . sodium chloride flush  10-40 mL Intracatheter Q12H  . sodium chloride flush  5 mL Intracatheter Q8H  . zinc sulfate  220 mg Oral Daily   Continuous Infusions: . sodium chloride    . [START ON 11/27/2020] vancomycin       LOS: 18 days    [name],  MD, [place], [facility]. [facility]  To contact the attending provider between 7A-7P or the covering provider during after hours 7P-7A, please log into the web site [email or web address] and access using universal [place] password for that web site. If you do not have the password, please call the hospital operator.

## 2020-11-26 NOTE — Progress Notes (Signed)
[facility] for Infectious Disease   Reason for visit: Follow up on acute calculus cholecystisis  Interval History: remains afebrile; s/p bilateral arthroscopic debridement of septic knees. 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 fluid Vancomycin after dialysis  Physical Exam: Constitutional:  Vitals:   11/25/20 2043 11/26/20 0400  BP: 103/66 112/64  Pulse: 92 86  Resp: 18 20  Temp: 98.4 F (36.9 C) 98.1 F (36.7 C)  SpO2: 91% 94%   patient appears in NAD Respiratory: Normal respiratory effort; CTA B Cardiovascular: RRR GI: soft, nt, nd MS: + bilateral knees wrapped with drain in place Back: + unstageable ulcer  Review of Systems: Constitutional: negative for fevers and chills Gastrointestinal: negative for nausea and diarrhea Integument/breast: negative for rash  Lab Results  Component Value Date   WBC 14.3 (H) 11/24/2020   HGB 7.9 (L) 11/24/2020   HCT 25.0 (L) 11/24/2020   MCV 86.2 11/24/2020   PLT 434 (H) 11/24/2020    Lab Results  Component Value Date   CREATININE 4.16 (H) 11/24/2020   BUN 41 (H) 11/24/2020   NA 132 (L) 11/24/2020   K 4.7 11/24/2020   CL 100 11/24/2020   CO2 19 (L) 11/24/2020    Lab Results  Component Value Date   ALT 35 11/21/2020   AST 48 (H) 11/21/2020   ALKPHOS 139 (H) 11/21/2020     Microbiology: Recent Results (from the past 240 hour(s))  Culture, blood (routine x 2)     Status: None   Collection Time: 11/18/20  4:57 PM   Specimen: BLOOD RIGHT HAND  Result Value Ref Range Status   Specimen Description BLOOD RIGHT HAND  Final   Special Requests   Final    BOTTLES DRAWN AEROBIC AND ANAEROBIC Blood Culture adequate volume   Culture   Final    NO GROWTH 5 DAYS Performed at [facility], [address], [place], [place] [address]    Report Status 11/23/2020 FINAL  Final  Culture, blood (routine x 2)     Status: None   Collection Time: 11/18/20  4:59 PM   Specimen: BLOOD LEFT HAND  Result  Value Ref Range Status   Specimen Description BLOOD LEFT HAND  Final   Special Requests   Final    BOTTLES DRAWN AEROBIC AND ANAEROBIC Blood Culture adequate volume   Culture   Final    NO GROWTH 5 DAYS Performed at [facility], [place] [address], [place], [place] [address]    Report Status 11/23/2020 FINAL  Final  Body fluid culture w Gram Stain     Status: None (Preliminary result)   Collection Time: 11/23/20  7:31 PM   Specimen: Body Fluid  Result Value Ref Range Status   Specimen Description FLUID SYNOVIAL RIGHT KNEE  Final   Special Requests Immunocompromised  Final   Gram Stain   Final    RARE WBC PRESENT, PREDOMINANTLY MONONUCLEAR NO ORGANISMS SEEN    Culture   Final    NO GROWTH 3 DAYS Performed at [facility], [address], [place], [place] [address]    Report Status PENDING  Incomplete  Body fluid culture w Gram Stain     Status: None (Preliminary result)   Collection Time: 11/23/20  7:32 PM   Specimen: Body Fluid  Result Value Ref Range Status   Specimen Description FLUID SYNOVIAL LEFT KNEE  Final   Special Requests Immunocompromised  Final   Gram Stain  Final    FEW WBC PRESENT,BOTH PMN AND MONONUCLEAR RARE GRAM POSITIVE COCCI    Culture   Final    NO GROWTH 3 DAYS Performed at [facility], [place] [address], [place], [place] [address]    Report Status PENDING  Incomplete  Aerobic/Anaerobic Culture w Gram Stain (surgical/deep wound)     Status: None (Preliminary result)   Collection Time: 11/24/20 12:34 PM   Specimen: PATH Cytology Misc. fluid; Body Fluid  Result Value Ref Range Status   Specimen Description FLUID  Final   Special Requests RIGHT KNEE SYNOVIAL FLUID SPEC A  Final   Gram Stain   Final    FEW WBC PRESENT,BOTH PMN AND MONONUCLEAR NO ORGANISMS SEEN    Culture   Final    NO GROWTH 2 DAYS Performed at [facility], [address] [place], [place] [address]    Report Status PENDING  Incomplete  Aerobic/Anaerobic  Culture w Gram Stain (surgical/deep wound)     Status: None (Preliminary result)   Collection Time: 11/24/20 12:36 PM   Specimen: Fluid  Result Value Ref Range Status   Specimen Description FLUID  Final   Special Requests LEFT KNEE SYNOVIAL SPEC B  Final   Gram Stain   Final    MODERATE WBC PRESENT,BOTH PMN AND MONONUCLEAR NO ORGANISMS SEEN    Culture   Final    NO GROWTH 2 DAYS Performed at [facility], [place] [address], [place], [place] [address]    Report Status PENDING  Incomplete  Culture, blood (Routine X 2) w Reflex to ID Panel     Status: None (Preliminary result)   Collection Time: 11/24/20 10:03 PM   Specimen: BLOOD  Result Value Ref Range Status   Specimen Description BLOOD SITE NOT SPECIFIED  Final   Special Requests   Final    BOTTLES DRAWN AEROBIC ONLY Blood Culture results may not be optimal due to an inadequate volume of blood received in culture bottles   Culture   Final    NO GROWTH 1 DAY Performed at [facility], [place] [address], [place], [place] [address]    Report Status PENDING  Incomplete  Culture, blood (Routine X 2) w Reflex to ID Panel     Status: None (Preliminary result)   Collection Time: 11/24/20 10:03 PM   Specimen: BLOOD  Result Value Ref Range Status   Specimen Description BLOOD SITE NOT SPECIFIED  Final   Special Requests   Final    BOTTLES DRAWN AEROBIC AND ANAEROBIC Blood Culture results may not be optimal due to an inadequate volume of blood received in culture bottles   Culture   Final    NO GROWTH 1 DAY Performed at [facility], [place] [address], [place],  [address]    Report Status PENDING  Incomplete    Impression/Plan:  1. E coli bacteremia - he has completed 14 days of IV meropenem for bacteremia with acute cholecystisis.  Plan for exchange of the cholecystostomy tube today.   No further meropenem indicated at this time  2.  Bilateral knee effusions - s/p debridment by Dr. Tamera Punt and a significant amount  of phlegmonous material noted.  WBCs in the joints minimal with just 75 in the right and 67 in the left and crystals noted on the left.  Will monitor cultures and continue vancomycin for now but I suspect this is non infectious.    3.  Therapeutic drug monitoring -  on vancomycin and will monitor levels, creat.

## 2020-11-26 NOTE — Consult Note (Addendum)
[facility] Nurse Consult Note: Pt was previously assessed on 5/24 and was noted to have moisture associated skin damage and a Stage 2 pressure injury to the inner gluteal cleft at that time.   Requested to reassess the location today and the area has declined to an Unstageable pressure injury to the inner gluteal cleft/sacrum.  4X1cm, 100% loose yellow slough Pressure Injury POA: No Dressing procedure/placement/frequency: Topical treatment orders provided for bedside nurses to perform as follows to assist with removal of nonviable tissue: Apply Santyl to sacrum wound Q day, then cover with moist 4X4 and foam dressing.  (change foam dressing Q 3 days or PRN soiling.) WOC team will reassess the site weekly to determine if a change in the plan of care is indicated at that time. Julien Girt MSN, RN, [place], [place], [place]

## 2020-11-26 NOTE — Progress Notes (Signed)
PATIENT ID: Tom Scott  MRN: [id number]  DOB/AGE:  25-Mar-1956 / 65 y.o.  2 Days Post-Op Procedure(s) (LRB): ARTHROSCOPIC  DEBRIDMENT KNEE (Bilateral)  Subjective: Bilateral knee pain is moderate depending on movement. Knees are feeling better than a couple days ago. No c/o chest pain or SOB.  Reports that he has not been up yet.   Objective: Vital signs in last 24 hours: Temp:  [98.1 F (36.7 C)-98.4 F (36.9 C)] 98.1 F (36.7 C) (06/06 0400) Pulse Rate:  [86-92] 86 (06/06 0400) Resp:  [17-20] 20 (06/06 0400) BP: (103-112)/(63-66) 112/64 (06/06 0400) SpO2:  [91 %-96 %] 94 % (06/06 0400) Weight:  [121.3 kg] 121.3 kg (06/06 0431)  Intake/Output from previous day: 06/05 0701 - 06/06 0700 In: 420 [P.O.:420] Out: -    Recent Labs    11/24/20 0237 11/24/20 2203  HGB 7.7* 7.9*   Recent Labs    11/24/20 0237 11/24/20 2203  WBC 12.1* 14.3*  RBC 2.90* 2.90*  HCT 24.5* 25.0*  PLT 402* 434*   Recent Labs    11/24/20 0237 11/24/20 2203  NA 132* 132*  K 3.1* 4.7  CL 96* 100  CO2 23 19*  BUN 56* 41*  CREATININE 5.70* 4.16*  GLUCOSE 149* 270*  CALCIUM 8.1* 8.1*    Physical Exam: Neurologically intact Intact pulses distally Dorsiflexion/Plantar flexion intact Compartment soft  Drains removed and dressing changed. Minimal bloody drainage from left knee hemovac site, no drainage from the right knee hemovac site.  Minimal erythema and warmth over the knees.  Incisions healed well  Assessment/Plan: 2 Days Post-Op Procedure(s) (LRB): ARTHROSCOPIC  DEBRIDMENT KNEE (Bilateral)   Advance diet Up with therapy when able Weight Bearing as Tolerated (WBAT)  VTE prophylaxis: pharmacologic prophylaxis (with any of the following: Eliquis)  Drains pulled today. Dressings changed given partially saturation from OR dressings. WBAT when able. Continue care per medicine as well.   Aldena Worm L. [place] 11/26/2020, 12:26 PM

## 2020-11-26 NOTE — Procedures (Addendum)
Interventional Radiology Procedure Note  Procedure:   Patient arrives for drain injection of perc chole, placed 11/09/2020.  Complaint of "drainage".   Injection of drain reveals that the drain has been withdrawn from the lumen of GB into the peritoneum.  No tract remains.    Drain was removed.  .  Complications: None  Recommendations:  - routine skin care until the site heals - currently the patient denies any symptoms of RUQ pain. If any concern to replace the perc chole, would discuss with surgery and decide if he needs cholecystectomy at this time.   Signed,  Dulcy Fanny. Earleen Newport, DO

## 2020-11-26 NOTE — Progress Notes (Signed)
Patient ID: Tom Scott, male   DOB: 03/14/1956, 65 y.o.   MRN: [id number] S: No complaints. O:BP 100/66 (BP Location: Left Arm)   Pulse 96   Temp 98.2 F (36.8 C) (Oral)   Resp 18   Ht '5\' 10"'$  (1.778 m)   Wt 121.3 kg   SpO2 94%   BMI 38.37 kg/m   Intake/Output Summary (Last 24 hours) at 11/26/2020 1504 Last data filed at 11/26/2020 1300 Gross per 24 hour  Intake 320 ml  Output 100 ml  Net 220 ml   Intake/Output: I/O last 3 completed shifts: In: 21 [P.O.:660] Out: -   Intake/Output this shift:  Total I/O In: 200 [P.O.:200] Out: 100 [Drains:100] Weight change: 1.3 kg Gen: NAD CVS: RRR Resp: CTA Abd: obese, cholecystostomy tube in place, NT Ext: 2+ pitting edema to presacral area.   Recent Labs  Lab 11/20/20 0112 11/20/20 0944 11/21/20 0245 11/22/20 0059 11/24/20 0237 11/24/20 2203  NA  --  127* 130* 129* 132* 132*  K  --  3.1* 2.9* 3.1* 3.1* 4.7  CL  --  96* 93* 95* 96* 100  CO2  --  19* '24 22 23 '$ 19*  GLUCOSE  --  178* 160* 160* 149* 270*  BUN  --  81* 42* 54* 56* 41*  CREATININE  --  7.34* 4.78* 5.65* 5.70* 4.16*  ALBUMIN 1.7*  --  1.6* 1.7*  --  2.0*  CALCIUM  --  7.8* 8.1* 7.9* 8.1* 8.1*  PHOS  --   --   --  6.4*  --  5.9*  AST 46*  --  48*  --   --   --   ALT 39  --  35  --   --   --    Liver Function Tests: Recent Labs  Lab 11/20/20 0112 11/21/20 0245 11/22/20 0059 11/24/20 2203  AST 46* 48*  --   --   ALT 39 35  --   --   ALKPHOS 144* 139*  --   --   BILITOT 1.2 1.3*  --   --   PROT 5.4* 5.5*  --   --   ALBUMIN 1.7* 1.6* 1.7* 2.0*   No results for input(s): LIPASE, AMYLASE in the last 168 hours. No results for input(s): AMMONIA in the last 168 hours. CBC: Recent Labs  Lab 11/21/20 0245 11/22/20 0059 11/23/20 0357 11/24/20 0237 11/24/20 2203  WBC 12.6* 12.0* 14.5* 12.1* 14.3*  HGB 7.0* 7.0* 8.0* 7.7* 7.9*  HCT 22.0* 21.6* 24.9* 24.5* 25.0*  MCV 83.3 81.5 84.7 84.5 86.2  PLT 328 379 374 402* 434*   Cardiac Enzymes: No results for  input(s): CKTOTAL, CKMB, CKMBINDEX, TROPONINI in the last 168 hours. CBG: Recent Labs  Lab 11/25/20 1156 11/25/20 1714 11/25/20 2041 11/26/20 0745 11/26/20 1231  GLUCAP 210* 209* 216* 184* 173*    Iron Studies: No results for input(s): IRON, TIBC, TRANSFERRIN, FERRITIN in the last 72 hours. Studies/Results: No results found. . (feeding supplement) PROSource Plus  30 mL Oral BID BM  . amiodarone  200 mg Oral Daily  . apixaban  5 mg Oral BID  . vitamin C  500 mg Oral BID  . calcium carbonate  1 tablet Oral TID  . Chlorhexidine Gluconate Cloth  6 each Topical Q0600  . cholecalciferol  1,000 Units Oral Daily  . collagenase   Topical Daily  . darbepoetin (ARANESP) injection - DIALYSIS  40 mcg Intravenous Q Sat-HD  . docusate sodium  100 mg  Oral BID  . insulin aspart  0-6 Units Subcutaneous TID WC  . iron polysaccharides  150 mg Oral Daily  . lactose free nutrition  237 mL Oral TID WC  . lidocaine  1 patch Transdermal Q24H  . multivitamin with minerals  1 tablet Oral BID  . rosuvastatin  10 mg Oral Daily  . sodium chloride flush  10-40 mL Intracatheter Q12H  . sodium chloride flush  5 mL Intracatheter Q8H  . zinc sulfate  220 mg Oral Daily    BMET    Component Value Date/Time   NA 132 (L) 11/24/2020 2203   K 4.7 11/24/2020 2203   CL 100 11/24/2020 2203   CO2 19 (L) 11/24/2020 2203   GLUCOSE 270 (H) 11/24/2020 2203   BUN 41 (H) 11/24/2020 2203   CREATININE 4.16 (H) 11/24/2020 2203   CALCIUM 8.1 (L) 11/24/2020 2203   GFRNONAA 15 (L) 11/24/2020 2203   CBC    Component Value Date/Time   WBC 14.3 (H) 11/24/2020 2203   RBC 2.90 (L) 11/24/2020 2203   HGB 7.9 (L) 11/24/2020 2203   HCT 25.0 (L) 11/24/2020 2203   PLT 434 (H) 11/24/2020 2203   MCV 86.2 11/24/2020 2203   MCH 27.2 11/24/2020 2203   MCHC 31.6 11/24/2020 2203   RDW 20.9 (H) 11/24/2020 2203     Assessment/Plan:  1. Acute kidney Injury: Secondary to septic shock/ATN and likely exacerbated by hemodynamic  insult from atrial fibrillation with RVR.  Started on hemodialysis on 5/23 and at this time he remains anuric and without any evidence of renal recovery.  Continue hemodialysis on a TTS schedule while monitoring for renal recovery.  1. Unfortunately no significant increase in UOP  2. Tunneled HD catheter placed 11/19/20 2. Calculus cholecystitis with E. coli bacteremia: Status post percutaneous cholecystostomy on 5/20 with suspected amiodarone associated elevation of LFTs.  3. Atrial fibrillation with rapid ventricular response: Rate control fair on amiodarone, likely compounded by infection from septic arthritis. 4. Microcytic anemia: On oral iron and status post Aranesp-likely compounded by acute/critical illness.  PRBC transfusion when hemoglobin <7.0. 5. Physical deconditioning: Secondary to acute illness and now limited by bilateral knee pain/swelling from septic arthritis. 6. Septic arthritis: Seen by orthopedic surgery for concerns of knee swelling 2 days ago with aspiration revealing cloudy aspirate in both knees with few GPC from the left knee; consequently underwent arthroscopic debridement of both knees yesterday.  Surgical cultures pending while on intravenous vancomycin. 7. Moderate protein malnutrition:  Presumably due to sepsis.  Donetta Potts, MD [employer] [phone]

## 2020-11-27 LAB — RENAL FUNCTION PANEL
Albumin: 1.9 g/dL — ABNORMAL LOW (ref 3.5–5.0)
Anion gap: 11 (ref 5–15)
BUN: 97 mg/dL — ABNORMAL HIGH (ref 8–23)
CO2: 23 mmol/L (ref 22–32)
Calcium: 8.1 mg/dL — ABNORMAL LOW (ref 8.9–10.3)
Chloride: 97 mmol/L — ABNORMAL LOW (ref 98–111)
Creatinine, Ser: 6.38 mg/dL — ABNORMAL HIGH (ref 0.61–1.24)
GFR, Estimated: 9 mL/min — ABNORMAL LOW (ref >60)
Glucose, Bld: 187 mg/dL — ABNORMAL HIGH (ref 70–99)
Phosphorus: 6.3 mg/dL — ABNORMAL HIGH (ref 2.5–4.6)
Potassium: 4.1 mmol/L (ref 3.5–5.1)
Sodium: 131 mmol/L — ABNORMAL LOW (ref 135–145)

## 2020-11-27 LAB — CBC
HCT: 23.9 % — ABNORMAL LOW (ref 39.0–52.0)
Hemoglobin: 7.5 g/dL — ABNORMAL LOW (ref 13.0–17.0)
MCH: 27.2 pg (ref 26.0–34.0)
MCHC: 31.4 g/dL (ref 30.0–36.0)
MCV: 86.6 fL (ref 80.0–100.0)
Platelets: 491 10*3/uL — ABNORMAL HIGH (ref 150–400)
RBC: 2.76 MIL/uL — ABNORMAL LOW (ref 4.22–5.81)
RDW: 19.8 % — ABNORMAL HIGH (ref 11.5–15.5)
WBC: 10.6 10*3/uL — ABNORMAL HIGH (ref 4.0–10.5)
nRBC: 0 % (ref 0.0–0.2)

## 2020-11-27 LAB — BODY FLUID CULTURE W GRAM STAIN
Culture: NO GROWTH
Culture: NO GROWTH

## 2020-11-27 LAB — GLUCOSE, CAPILLARY
Glucose-Capillary: 157 mg/dL — ABNORMAL HIGH (ref 70–99)
Glucose-Capillary: 124 mg/dL — ABNORMAL HIGH (ref 70–99)
Glucose-Capillary: 161 mg/dL — ABNORMAL HIGH (ref 70–99)
Glucose-Capillary: 158 mg/dL — ABNORMAL HIGH (ref 70–99)

## 2020-11-27 MED ORDER — MIDODRINE HCL 5 MG PO TABS
ORAL_TABLET | ORAL | Status: AC
  Filled 2020-11-27: qty 2

## 2020-11-27 MED ORDER — ALBUMIN HUMAN 25 % IV SOLN: 12.5000 g | Freq: Once | INTRAVENOUS | Status: AC

## 2020-11-27 MED ORDER — VANCOMYCIN HCL IN DEXTROSE 1-5 GM/200ML-% IV SOLN
INTRAVENOUS | Status: AC
  Filled 2020-11-27: qty 200

## 2020-11-27 MED ORDER — MIDODRINE HCL 5 MG PO TABS
10.0000 mg | ORAL_TABLET | ORAL | Status: DC
  Administered 2020-11-27 – 2020-11-29 (×2): 10 mg via ORAL
  Filled 2020-11-27 (×2): qty 2

## 2020-11-27 MED ORDER — ALBUMIN HUMAN 25 % IV SOLN
12.5000 g | Freq: Once | INTRAVENOUS | Status: AC
  Filled 2020-11-27: qty 50

## 2020-11-27 MED ORDER — ALBUMIN HUMAN 25 % IV SOLN
INTRAVENOUS | Status: AC
  Administered 2020-11-27: 12.5 g via INTRAVENOUS
  Filled 2020-11-27: qty 50

## 2020-11-27 MED ORDER — HEPARIN SODIUM (PORCINE) 1000 UNIT/ML IJ SOLN
INTRAMUSCULAR | Status: AC
  Administered 2020-11-27: 3800 [IU]
  Filled 2020-11-27: qty 4

## 2020-11-27 MED ORDER — HEPARIN SODIUM (PORCINE) 1000 UNIT/ML IJ SOLN
INTRAMUSCULAR | Status: AC
  Administered 2020-11-27: 2300 [IU] via INTRAVENOUS_CENTRAL
  Filled 2020-11-27: qty 3

## 2020-11-27 NOTE — Progress Notes (Signed)
Patient ID: Tom Scott, male   DOB: 11/02/1955, 65 y.o.   MRN: [id number] S: Seen on HD.  BP was low at start of HD and received 2 doses of IV albumin without much improvement.  He is asymptomatic.   O:BP (!) 99/53   Pulse 88   Temp (!) 89.9 F (32.2 C) (Oral)   Resp 19   Ht '5\' 10"'$  (1.778 m)   Wt 117.3 kg   SpO2 94%   BMI 37.11 kg/m   Intake/Output Summary (Last 24 hours) at 11/27/2020 0855 Last data filed at 11/26/2020 1700 Gross per 24 hour  Intake 200 ml  Output 100 ml  Net 100 ml   Intake/Output: I/O last 3 completed shifts: In: 320 [P.O.:320] Out: 100 [Drains:100]  Intake/Output this shift:  No intake/output data recorded. Weight change: -0.2 kg Gen: chronically ill-appearing WM in NAD CVS: IRR, no rub Resp: CTA Abd: obese, +BS, soft, NT/ND, cholecystostomy tube in place Ext: 2+ BLE edema to presacral area.   Recent Labs  Lab 11/20/20 0944 11/21/20 0245 11/22/20 0059 11/24/20 0237 11/24/20 2203 11/27/20 0659  NA 127* 130* 129* 132* 132* 131*  K 3.1* 2.9* 3.1* 3.1* 4.7 4.1  CL 96* 93* 95* 96* 100 97*  CO2 19* '24 22 23 '$ 19* 23  GLUCOSE 178* 160* 160* 149* 270* 187*  BUN 81* 42* 54* 56* 41* 97*  CREATININE 7.34* 4.78* 5.65* 5.70* 4.16* 6.38*  ALBUMIN  --  1.6* 1.7*  --  2.0* 1.9*  CALCIUM 7.8* 8.1* 7.9* 8.1* 8.1* 8.1*  PHOS  --   --  6.4*  --  5.9* 6.3*  AST  --  48*  --   --   --   --   ALT  --  35  --   --   --   --    Liver Function Tests: Recent Labs  Lab 11/21/20 0245 11/22/20 0059 11/24/20 2203 11/27/20 0659  AST 48*  --   --   --   ALT 35  --   --   --   ALKPHOS 139*  --   --   --   BILITOT 1.3*  --   --   --   PROT 5.5*  --   --   --   ALBUMIN 1.6* 1.7* 2.0* 1.9*   No results for input(s): LIPASE, AMYLASE in the last 168 hours. No results for input(s): AMMONIA in the last 168 hours. CBC: Recent Labs  Lab 11/22/20 0059 11/23/20 0357 11/24/20 0237 11/24/20 2203 11/27/20 0659  WBC 12.0* 14.5* 12.1* 14.3* 10.6*  HGB 7.0* 8.0* 7.7* 7.9*  7.5*  HCT 21.6* 24.9* 24.5* 25.0* 23.9*  MCV 81.5 84.7 84.5 86.2 86.6  PLT 379 374 402* 434* 491*   Cardiac Enzymes: No results for input(s): CKTOTAL, CKMB, CKMBINDEX, TROPONINI in the last 168 hours. CBG: Recent Labs  Lab 11/26/20 0745 11/26/20 1231 11/26/20 1736 11/26/20 2103 11/27/20 0411  GLUCAP 184* 173* 174* 200* 157*    Iron Studies: No results for input(s): IRON, TIBC, TRANSFERRIN, FERRITIN in the last 72 hours. Studies/Results: IR Sinus/Fist Tube Chk-Non GI  Result Date: 11/27/2020 INDICATION: 65 year old male with a history of leakage at the skin of a percutaneous cholecystostomy. He presents for drain injection EXAM: IMAGE GUIDED DRAIN INJECTION MEDICATIONS: None ANESTHESIA/SEDATION: None COMPLICATIONS: None PROCEDURE: Informed written consent was obtained from the patient after a thorough discussion of the procedural risks, benefits and alternatives. All questions were addressed. Maximal Sterile Barrier Technique was utilized  including caps, mask, sterile gowns, sterile gloves, sterile drape, hand hygiene and skin antiseptic. A timeout was performed prior to the initiation of the procedure. Patient was positioned supine on the image intensifier table. The drain was prepped and draped in the usual sterile fashion. Scout images were acquired. Gentle contrast injection of the drain revealed and intraperitoneal location of the pigtail drainage catheter. The catheter was ligated and a wire was passed into the space. Kumpe the catheter was advanced on the wire. The Kumpe the catheter was injected with some gentle manipulation in attempt to find an existing tract into the gallbladder. Ultimately the catheter was removed and a sterile bandage was placed at the former access site. No drainage catheter at the completion of the study. No blood loss. No complications. IMPRESSION: Status post drain injection of a former percutaneous cholecystostomy revealing that the drain has been inadvertently  withdrawn and removed from the lumen of the gallbladder. Given the recent placement, there is no mature tract for a replacement at this time. Drain was completely removed. Signed, Dulcy Fanny. Dellia Nims, RPVI [facility] Electronically Signed   By: Corrie Mckusick D.O.   On: 11/27/2020 08:31   . (feeding supplement) PROSource Plus  30 mL Oral BID BM  . amiodarone  200 mg Oral Daily  . apixaban  5 mg Oral BID  . vitamin C  500 mg Oral BID  . calcium carbonate  1 tablet Oral TID  . Chlorhexidine Gluconate Cloth  6 each Topical Q0600  . cholecalciferol  1,000 Units Oral Daily  . collagenase   Topical Daily  . darbepoetin (ARANESP) injection - DIALYSIS  40 mcg Intravenous Q Sat-HD  . docusate sodium  100 mg Oral BID  . insulin aspart  0-6 Units Subcutaneous TID WC  . iron polysaccharides  150 mg Oral Daily  . lactose free nutrition  237 mL Oral TID WC  . lidocaine  1 patch Transdermal Q24H  . multivitamin with minerals  1 tablet Oral BID  . rosuvastatin  10 mg Oral Daily  . sodium chloride flush  10-40 mL Intracatheter Q12H  . sodium chloride flush  5 mL Intracatheter Q8H  . zinc sulfate  220 mg Oral Daily    BMET    Component Value Date/Time   NA 131 (L) 11/27/2020 0659   K 4.1 11/27/2020 0659   CL 97 (L) 11/27/2020 0659   CO2 23 11/27/2020 0659   GLUCOSE 187 (H) 11/27/2020 0659   BUN 97 (H) 11/27/2020 0659   CREATININE 6.38 (H) 11/27/2020 0659   CALCIUM 8.1 (L) 11/27/2020 0659   GFRNONAA 9 (L) 11/27/2020 0659   CBC    Component Value Date/Time   WBC 10.6 (H) 11/27/2020 0659   RBC 2.76 (L) 11/27/2020 0659   HGB 7.5 (L) 11/27/2020 0659   HCT 23.9 (L) 11/27/2020 0659   PLT 491 (H) 11/27/2020 0659   MCV 86.6 11/27/2020 0659   MCH 27.2 11/27/2020 0659   MCHC 31.4 11/27/2020 0659   RDW 19.8 (H) 11/27/2020 0659     Assessment/Plan:  1. Acute kidney Injury:Secondary to septic shock/ATN and likely exacerbated by  hemodynamic insult from atrial fibrillation with RVR. Started on hemodialysis on 5/23 and at this time he remains anuric andwithout any evidence of renal recovery. Continue hemodialysis on a TTS schedule while monitoring for renal recovery.  1. Unfortunately no significant increase in UOP  2. Tunneled HD catheter placed 11/19/20 3. Hypotensive on HD so  will add midodrine 10 mg before HD moving forward.  2. Calculus cholecystitis with E. coli bacteremia:Status post percutaneous cholecystostomy on 5/20 with suspected amiodarone associated elevation of LFTs.  3. Atrial fibrillation with rapid ventricular response:Rate control fair on amiodarone, likely compounded by infection from septic arthritis. 4. Microcytic anemia:On oral iron and status post Aranesp-likely compounded by acute/critical illness. PRBC transfusion when hemoglobin <7.0. 5. Physical deconditioning: Secondary to acute illness andnow limited by bilateral knee pain/swelling from septic arthritis. 6. Septic arthritis:Seen by orthopedic surgery for concerns of knee swelling 2 days ago with aspiration revealing cloudy aspirate in both knees with few GPC from the left knee; consequently underwent arthroscopic debridement of both knees yesterday. Surgical cultures pending while on intravenous vancomycin. 7. Moderate protein malnutrition:  Presumably due to sepsis.   Donetta Potts, MD [employer] [phone]

## 2020-11-27 NOTE — Progress Notes (Signed)
3 Days Post-Op  Subjective: Seen during HD. Denies fevers, abd pain. Tolerating PO without nausea or vomiting. Reports non-bloody BMs - last BM 2 days ago as charted. No respiratory complaints  ROS: see above  Objective: Vital signs in last 24 hours: Temp:  [89.9 F (32.2 C)-98.9 F (37.2 C)] 98.9 F (37.2 C) (06/07 1102) Pulse Rate:  [81-102] 96 (06/07 1102) Resp:  [12-20] 14 (06/07 1102) BP: (80-112)/(37-66) 99/47 (06/07 1102) SpO2:  [94 %-96 %] 96 % (06/07 1102) Weight:  [116.9 kg-121.1 kg] 116.9 kg (06/07 1102) Last BM Date: 11/25/20  Intake/Output from previous day: 06/06 0701 - 06/07 0700 In: 200 [P.O.:200] Out: 100 [Drains:100] Intake/Output this shift: Total I/O In: 0  Out: 678 [Other:678]  PE: Abd: soft, NT, ND, +BS, interval removal of perc chole tube - dressing over site c/d/i. No erythema or discharge   Lab Results:  Recent Labs    11/24/20 2203 11/27/20 0659  WBC 14.3* 10.6*  HGB 7.9* 7.5*  HCT 25.0* 23.9*  PLT 434* 491*   BMET Recent Labs    11/24/20 2203 11/27/20 0659  NA 132* 131*  K 4.7 4.1  CL 100 97*  CO2 19* 23  GLUCOSE 270* 187*  BUN 41* 97*  CREATININE 4.16* 6.38*  CALCIUM 8.1* 8.1*   PT/INR No results for input(s): LABPROT, INR in the last 72 hours. CMP     Component Value Date/Time   NA 131 (L) 11/27/2020 0659   K 4.1 11/27/2020 0659   CL 97 (L) 11/27/2020 0659   CO2 23 11/27/2020 0659   GLUCOSE 187 (H) 11/27/2020 0659   BUN 97 (H) 11/27/2020 0659   CREATININE 6.38 (H) 11/27/2020 0659   CALCIUM 8.1 (L) 11/27/2020 0659   PROT 5.5 (L) 11/21/2020 0245   ALBUMIN 1.9 (L) 11/27/2020 0659   AST 48 (H) 11/21/2020 0245   ALT 35 11/21/2020 0245   ALKPHOS 139 (H) 11/21/2020 0245   BILITOT 1.3 (H) 11/21/2020 0245   GFRNONAA 9 (L) 11/27/2020 0659   Lipase     Component Value Date/Time   LIPASE 27 11/09/2020 0358       Studies/Results: IR Sinus/Fist Tube Chk-Non GI  Result Date: 11/27/2020 INDICATION: 65 year old  male with a history of leakage at the skin of a percutaneous cholecystostomy. He presents for drain injection EXAM: IMAGE GUIDED DRAIN INJECTION MEDICATIONS: None ANESTHESIA/SEDATION: None COMPLICATIONS: None PROCEDURE: Informed written consent was obtained from the patient after a thorough discussion of the procedural risks, benefits and alternatives. All questions were addressed. Maximal Sterile Barrier Technique was utilized including caps, mask, sterile gowns, sterile gloves, sterile drape, hand hygiene and skin antiseptic. A timeout was performed prior to the initiation of the procedure. Patient was positioned supine on the image intensifier table. The drain was prepped and draped in the usual sterile fashion. Scout images were acquired. Gentle contrast injection of the drain revealed and intraperitoneal location of the pigtail drainage catheter. The catheter was ligated and a wire was passed into the space. Kumpe the catheter was advanced on the wire. The Kumpe the catheter was injected with some gentle manipulation in attempt to find an existing tract into the gallbladder. Ultimately the catheter was removed and a sterile bandage was placed at the former access site. No drainage catheter at the completion of the study. No blood loss. No complications. IMPRESSION: Status post drain injection of a former percutaneous cholecystostomy revealing that the drain has been inadvertently withdrawn and removed from the lumen of  the gallbladder. Given the recent placement, there is no mature tract for a replacement at this time. Drain was completely removed. Signed, Dulcy Fanny. Dellia Nims, RPVI [facility] Radiology Electronically Signed   By: Corrie Mckusick D.O.   On: 11/27/2020 08:31    Anti-infectives: Anti-infectives (From admission, onward)   Start     Dose/Rate Route Frequency Ordered Stop   11/27/20 1200  vancomycin (VANCOREADY) IVPB 1000 mg/200 mL  Status:   Discontinued        1,000 mg 200 mL/hr over 60 Minutes Intravenous Every T-Th-Sa (Hemodialysis) 11/26/20 1044 11/27/20 0929   11/27/20 0936  vancomycin (VANCOCIN) 1-5 GM/200ML-% IVPB       Note to Pharmacy: Ashley Akin   : cabinet override      11/27/20 0936 11/27/20 2144   11/24/20 1300  vancomycin (VANCOCIN) 2,500 mg in sodium chloride 0.9 % 500 mL IVPB        2,500 mg 250 mL/hr over 120 Minutes Intravenous  Once 11/24/20 1121 11/24/20 1529   11/24/20 1121  vancomycin variable dose per unstable renal function (pharmacist dosing)  Status:  Discontinued         Does not apply See admin instructions 11/24/20 1121 11/26/20 1044   11/20/20 1230  meropenem (MERREM) 500 mg in sodium chloride 0.9 % 100 mL IVPB        500 mg 200 mL/hr over 30 Minutes Intravenous Every 24 hours 11/20/20 0933 11/23/20 1045   11/19/20 0800  vancomycin (VANCOCIN) 1-5 GM/200ML-% IVPB       Note to Pharmacy: Hilma Favors   : cabinet override      11/19/20 0800 11/19/20 0829   11/13/20 2200  meropenem (MERREM) 500 mg in sodium chloride 0.9 % 100 mL IVPB        500 mg 200 mL/hr over 30 Minutes Intravenous Every 24 hours 11/13/20 0916 11/19/20 1028   11/12/20 2200  meropenem (MERREM) 1 g in sodium chloride 0.9 % 100 mL IVPB  Status:  Discontinued        1 g 200 mL/hr over 30 Minutes Intravenous Every 24 hours 11/12/20 0742 11/13/20 0916   11/10/20 2300  meropenem (MERREM) 1 g in sodium chloride 0.9 % 100 mL IVPB  Status:  Discontinued        1 g 200 mL/hr over 30 Minutes Intravenous Every 12 hours 11/10/20 2207 11/12/20 0742   11/08/20 1600  ceFEPIme (MAXIPIME) 2 g in sodium chloride 0.9 % 100 mL IVPB  Status:  Discontinued        2 g 200 mL/hr over 30 Minutes Intravenous Every 12 hours 11/08/20 1456 11/10/20 2207   11/08/20 1500  metroNIDAZOLE (FLAGYL) IVPB 500 mg  Status:  Discontinued        500 mg 100 mL/hr over 60 Minutes Intravenous Every 8 hours 11/08/20 1404 11/10/20 2213       Assessment/Plan E  coli bacteremia - resolved ARF - on HD DM A fib HTN Thrombocytopenia - improving Diffuse body pain  Acute cholecystitis -s/p perc chole drain by IR 11/09/20  - drain removed yesterday during drain injection due to findings of drain not in tract. - patient asymptomatic with stable and improved WBC, afebrile, asymptomatic - as long as patient remains asymptomatic and improving from abdominal standpoint okay for outpatient follow up and planning of cholecystectomy as scheduled. Do not recommend surgical intervention or drain replacement at this time  We will sign off but please do not hesitate  to contact us with any questions or concerns  FEN - regular diet VTE - heparin, eliquis ID - merrem 5/27 >6/03, vanc 6/5   LOS: 19 days    Richard Miu , [facility] 11/27/2020, 11:28 AM Please see Amion for pager number during day hours 7:00am-4:30pm or 7:00am -11:30am on weekends

## 2020-11-27 NOTE — Progress Notes (Signed)
PROGRESS NOTE    Tom Armentrout  [id number] DOB: [birth year] DOA: 11/08/2020 PCP: Patient, No Pcp Per (Inactive)   Brief Narrative: Tom Scott is a 65 y.o. male with a history of atrial fibrillation, hypertension, diabetes mellitus, hyperlipidemia initially presented to [facility] secondary to chest pain and epigastric pain.  He developed septic shock requiring pressors.  Reported to have cholecystitis with choledocholithiasis and possible need for ERCP.  Also developed A. fib with RVR.  He was transferred to [facility] for ERCP and due to septic shock requiring vasopressor support.  He was admitted to the ICU.  General surgery and gastroenterology were consulted for management of his acute calculus cholecystitis.  IR consulted for percutaneous cholecystostomy and patient managed with empiric antibiotics.  While admitted, patient developed AKI/ATN requiring initiation of hemodialysis and no signs of renal recovery.  Also while admitted, patient was found to have evidence of ESBL E. coli bacteremia from outside hospital report and has completed 14-day course of meropenem, percutaneous cholecystostomy now removed and needs to follow-up with general surgery in 3 to 4 weeks for laparoscopic cholecystectomy consideration.  Hospital course further complicated by acute arthritis of bilateral knees, suspected septic arthritis based on a positive Gram stain, briefly on empiric IV vancomycin, s/p bilateral knee washout by orthopedics, cultures however negative, septic arthritis ruled out and all antibiotics discontinued.   Assessment & Plan:   Principal Problem:   Septic shock ([place]) Active Problems:   Pressure injury of skin   Protein-calorie malnutrition, severe   Encounter for central line placement   AKI (acute kidney injury) ([place])   RUQ abdominal pain   Biliary sepsis   Elevated liver function tests   Septic shock, POA Secondary to cholecystitis and ESBL E. coli  bacteremia. Patient required ICU admission with vasopressor support.  Resolved.  Acute calculus cholecystitis General surgery and IR consulted. IR placed percutaneous cholecystostomy on 5/20.  LFTs have almost normalized. Fluid culture significant for E. Coli, in addition to rare bacteroides fragilis.  On 6/6, IR noted gallbladder drain had been withdrawn from the lumen of gallbladder into the peritoneum and there was no tract and hence drain was removed.  No indications for additional drain placement.  General surgery followed up, stable with no signs of acute cholecystitis, due to recent drain placement and inflammation would hold off 3 to 4 weeks prior to interval laparoscopic cholecystectomy, outpatient follow-up with general surgery.  GI evaluated for rising LFTs on 5/29, felt to be secondary to cholecystitis +/- amiodarone.  GI has since signed off on 5/31.  Completed 14 days of meropenem.  ID signed off.    ESBL E. Coli bacteremia Report received from OSH per PCCM that reports ESBL bacteremia and patient completed 14 days of IV meropenem on 6/3.  Surveillance blood cultures (5/29) without growth.  Developed fever of 101.2 overnight 5/31.  RUE venous Doppler negative for DVT or SVT. Left IJ central line discontinued 6/4.  Acute on chronic bilateral knee arthritis Initially suspected septic arthritis based on a positive Gram stain in left knee, briefly on empiric IV vancomycin, s/p bilateral knee washout by orthopedics, cultures however negative, septic arthritis ruled out and all antibiotics discontinued by ID and ID signed off. Right knee synovial fluid from surgery: Extracellular CPPD crystals.  Left knee negative for crystals.  If does not improve?  Role for intra-articular steroids versus systemic steroids.  Uric acid normal.  WBAT.  Acute metabolic encephalopathy Secondary to uremia.  Resolved.  Acute  kidney injury Secondary to ATN secondary to septic shock. Patient started on hemodialysis  on 5/23.  Anuric.  No signs of renal recovery.  Nephrology following and TTS HD schedule while monitoring for renal recovery.  As discussed with nephrology on 6/7, will need to be able to sit up for HD if he is discharged to SNF and moreover his insurance has to approve for outpatient HD for AKI diagnosis and patient is not ESRD at this time.  Atrial fibrillation with RVR Patient started on amiodarone for control. On Eliquis for stroke prevention.  Rate controlled.  Asymptomatic anemia Patient with unknown baseline. Hemoglobin of 11 on admission with slow downtrend. No obvious source of bleeding. - On oral iron and s/p Aranesp.  Likely related to critical illness.  S/p 2 units PRBC across HD on 6/2.  Trend CBCs and transfuse if hemoglobin 7 g or below.  Monitor labs across HD.  Hemoglobin again drifting down gradually/7.5.  Anasarca Secondary to fluid resuscitation with IV fluids in setting of kidney failure in addition to albuminemia. Current I/O of +17.4L.  Volume management across HD.  Still substantial volume overload with pitting edema in legs.  Essential hypertension Patient is on amlodipine, losartan and metoprolol as an outpatient. Antihypertensives held secondary to hypotension and septic shock.  Blood pressures either normal or soft.  Continue to hold antihypertensives.  Diabetes mellitus, type 2 Patient is on metformin as an outpatient.  Mildly uncontrolled.  Continue SSI and may need adjustment.  Hyperlipidemia -Continue Crestor  Severe malnutrition History of gastric bypass. Worse secondary to critical illness. NG tube inserted on 5/25 and patient started on tube feeds on 5/25. NG tube removed by patient on 5/28. -Oral intake -Dietitian recommendations:  Trial Boost Plus chocolate TID- Each supplement provides 360kcal and 14g protein.    30 ml ProSource Plus BID, each supplement provides 100 kcals and 15 grams protein.   MVI with minerals BID per tube  500 mg calcium  carbonate TID per tube  Supplement Vitamin C 500 mg BID   Pressure injury Medial coccyx, not POA  Pressure Injury 11/12/20 Sacrum Medial Unstageable - Full thickness tissue loss in which the base of the injury is covered by slough (yellow, tan, gray, green or brown) and/or eschar (tan, brown or black) in the wound bed. (Active)  11/12/20 2000  Location: Sacrum  Location Orientation: Medial  Staging: Unstageable - Full thickness tissue loss in which the base of the injury is covered by slough (yellow, tan, gray, green or brown) and/or eschar (tan, brown or black) in the wound bed.  Wound Description (Comments):   Present on Admission: No    Diarrhea Likely multifactorial due to gallbladder disease and antibiotics.  Significantly improved.  Hypokalemia Resolved.  Right upper extremity pain and swelling ?  Related to asymmetric edema complicating underlying arthritis.  Venous Doppler negative for DVT or SVT.  Elevate and monitor.  Resolved   DVT prophylaxis: Eliquis Code Status:   Code Status: Full Code Family Communication: Discussed in detail with patient spouse at bedside 6/7, updated care and answered all questions. Disposition Plan: May need to consider LTAC given severe deconditioning and need for ongoing HD.  Medicated with TOC.   Consultants:   PCCM  Gastroenterology  General surgery  Interventional radiology  Nephrology  Orthopedic  Procedures:   As noted above.  Bilateral knee aspiration by orthopedics 6/3  Bilateral knee arthroscopic I&D in OR by orthopedic 6/4  Antimicrobials:  Flagyl-discontinued  Cefepime-discontinued  Meropenem-completed 6/3  Subjective: Per spouse at bedside, mental status changes have resolved, diarrhea improved and had 1 BM overnight.  Knee pain seem to be better since he is moving his lower extremities better.  Advised her that he needs to be encouraged to mobilize and turning bed.  Patient reports somewhat decreased  knee pain.  Objective: Vitals:   11/27/20 1045 11/27/20 1102 11/27/20 1130 11/27/20 1318  BP: (!) 85/45 (!) 99/47 (!) 109/54 (!) 105/59  Pulse: 89 96 95 100  Resp:  14 17   Temp:  98.9 F (37.2 C) 98.4 F (36.9 C) 98.2 F (36.8 C)  TempSrc:  Oral Oral Oral  SpO2:  96% 95% 96%  Weight:  116.9 kg    Height:        Intake/Output Summary (Last 24 hours) at 11/27/2020 1523 Last data filed at 11/27/2020 1300 Gross per 24 hour  Intake 120 ml  Output 678 ml  Net -558 ml   Filed Weights   11/27/20 0402 11/27/20 0723 11/27/20 1102  Weight: 121.1 kg 117.3 kg 116.9 kg    Examination:  General exam: Middle-age male, moderately built and obese lying comfortably supine in bed.  Very deconditioned. Respiratory system: Clear to auscultation.  No increased work of breathing. Cardiovascular system: S1 and S2 heard, irregularly irregular.  No JVD or murmurs.  2+ pitting bilateral leg edema.  Telemetry personally reviewed: A. fib with controlled ventricular rate. Gastrointestinal system: Abdomen is nondistended, soft and nontender. No organomegaly or masses felt. Normal bowel sounds heard. Central nervous system: Alert and oriented. No focal neurological deficits. Extremities: Symmetric 5 x 5 power in upper extremities.  Bilateral knees with postop dressings C/D/I.  Restricted and somewhat painful knee movements. Skin: No rashes, lesions or ulcers Psychiatry: Judgement and insight improved and appears intact. Mood & affect pleasant and cooperative.      Data Reviewed: I have personally reviewed following labs and imaging studies  CBC Lab Results  Component Value Date   WBC 10.6 (H) 11/27/2020   RBC 2.76 (L) 11/27/2020   HGB 7.5 (L) 11/27/2020   HCT 23.9 (L) 11/27/2020   MCV 86.6 11/27/2020   MCH 27.2 11/27/2020   PLT 491 (H) 11/27/2020   MCHC 31.4 11/27/2020   RDW 19.8 (H) 123456     Last metabolic panel Lab Results  Component Value Date   NA 131 (L) 11/27/2020   K 4.1  11/27/2020   CL 97 (L) 11/27/2020   CO2 23 11/27/2020   BUN 97 (H) 11/27/2020   CREATININE 6.38 (H) 11/27/2020   GLUCOSE 187 (H) 11/27/2020   GFRNONAA 9 (L) 11/27/2020   CALCIUM 8.1 (L) 11/27/2020   PHOS 6.3 (H) 11/27/2020   PROT 5.5 (L) 11/21/2020   ALBUMIN 1.9 (L) 11/27/2020   BILITOT 1.3 (H) 11/21/2020   ALKPHOS 139 (H) 11/21/2020   AST 48 (H) 11/21/2020   ALT 35 11/21/2020   ANIONGAP 11 11/27/2020    CBG (last 3)  Recent Labs    11/26/20 2103 11/27/20 0411 11/27/20 1135  GLUCAP 200* 157* 124*     GFR: Estimated Creatinine Clearance: 14.4 mL/min (A) (by C-G formula based on SCr of 6.38 mg/dL (H)).  Coagulation Profile: No results for input(s): INR, PROTIME in the last 168 hours.  Recent Results (from the past 240 hour(s))  Culture, blood (routine x 2)     Status: None   Collection Time: 11/18/20  4:57 PM   Specimen: BLOOD RIGHT HAND  Result Value Ref Range Status  Specimen Description BLOOD RIGHT HAND  Final   Special Requests   Final    BOTTLES DRAWN AEROBIC AND ANAEROBIC Blood Culture adequate volume   Culture   Final    NO GROWTH 5 DAYS Performed at [facility], [address] [place], [place] [address]    Report Status 11/23/2020 FINAL  Final  Culture, blood (routine x 2)     Status: None   Collection Time: 11/18/20  4:59 PM   Specimen: BLOOD LEFT HAND  Result Value Ref Range Status   Specimen Description BLOOD LEFT HAND  Final   Special Requests   Final    BOTTLES DRAWN AEROBIC AND ANAEROBIC Blood Culture adequate volume   Culture   Final    NO GROWTH 5 DAYS Performed at [facility], [place] [address] [place], [place] [address]    Report Status 11/23/2020 FINAL  Final  Body fluid culture w Gram Stain     Status: None   Collection Time: 11/23/20  7:31 PM   Specimen: Body Fluid  Result Value Ref Range Status   Specimen Description FLUID SYNOVIAL RIGHT KNEE  Final   Special Requests Immunocompromised  Final   Gram Stain   Final     RARE WBC PRESENT, PREDOMINANTLY MONONUCLEAR NO ORGANISMS SEEN    Culture   Final    NO GROWTH 3 DAYS Performed at [facility], [address], [place], [place] [address]    Report Status 11/27/2020 FINAL  Final  Body fluid culture w Gram Stain     Status: None   Collection Time: 11/23/20  7:32 PM   Specimen: Body Fluid  Result Value Ref Range Status   Specimen Description FLUID SYNOVIAL LEFT KNEE  Final   Special Requests Immunocompromised  Final   Gram Stain   Final    FEW WBC PRESENT,BOTH PMN AND MONONUCLEAR RARE GRAM POSITIVE COCCI    Culture   Final    NO GROWTH 3 DAYS Performed at [facility], [place] [address], [place], [place] [address]    Report Status 11/27/2020 FINAL  Final  Aerobic/Anaerobic Culture w Gram Stain (surgical/deep wound)     Status: None (Preliminary result)   Collection Time: 11/24/20 12:34 PM   Specimen: PATH Cytology Misc. fluid; Body Fluid  Result Value Ref Range Status   Specimen Description FLUID  Final   Special Requests RIGHT KNEE SYNOVIAL FLUID SPEC A  Final   Gram Stain   Final    FEW WBC PRESENT,BOTH PMN AND MONONUCLEAR NO ORGANISMS SEEN    Culture   Final    NO GROWTH 3 DAYS NO ANAEROBES ISOLATED; CULTURE IN PROGRESS FOR 5 DAYS Performed at [facility], [place] [address], [place], [place] [address]    Report Status PENDING  Incomplete  Aerobic/Anaerobic Culture w Gram Stain (surgical/deep wound)     Status: None (Preliminary result)   Collection Time: 11/24/20 12:36 PM   Specimen: Fluid  Result Value Ref Range Status   Specimen Description FLUID  Final   Special Requests LEFT KNEE SYNOVIAL SPEC B  Final   Gram Stain   Final    MODERATE WBC PRESENT,BOTH PMN AND MONONUCLEAR NO ORGANISMS SEEN    Culture   Final    NO GROWTH 3 DAYS NO ANAEROBES ISOLATED; CULTURE IN PROGRESS FOR 5 DAYS Performed at [facility], [place] [address], [place], [place] [address]    Report Status PENDING  Incomplete  Culture, blood  (Routine X 2) w Reflex to ID Panel     Status: None (Preliminary result)   Collection Time: 11/24/20 10:03 PM   Specimen: BLOOD  Result Value Ref Range Status   Specimen Description BLOOD SITE NOT SPECIFIED  Final   Special Requests   Final    BOTTLES DRAWN AEROBIC ONLY Blood Culture results may not be optimal due to an inadequate volume of blood received in culture bottles   Culture   Final    NO GROWTH 2 DAYS Performed at [facility], [place] [address], [place], [place] [address]    Report Status PENDING  Incomplete  Culture, blood (Routine X 2) w Reflex to ID Panel     Status: None (Preliminary result)   Collection Time: 11/24/20 10:03 PM   Specimen: BLOOD  Result Value Ref Range Status   Specimen Description BLOOD SITE NOT SPECIFIED  Final   Special Requests   Final    BOTTLES DRAWN AEROBIC AND ANAEROBIC Blood Culture results may not be optimal due to an inadequate volume of blood received in culture bottles   Culture   Final    NO GROWTH 2 DAYS Performed at [facility], [place] [address] [place] [address], [place],  [address]    Report Status PENDING  Incomplete        Radiology Studies: IR Sinus/Fist Tube Chk-Non GI  Result Date: 11/27/2020 INDICATION: 65 year old male with a history of leakage at the skin of a percutaneous cholecystostomy. He presents for drain injection EXAM: IMAGE GUIDED DRAIN INJECTION MEDICATIONS: None ANESTHESIA/SEDATION: None COMPLICATIONS: None PROCEDURE: Informed written consent was obtained from the patient after a thorough discussion of the procedural risks, benefits and alternatives. All questions were addressed. Maximal Sterile Barrier Technique was utilized including caps, mask, sterile gowns, sterile gloves, sterile drape, hand hygiene and skin antiseptic. A timeout was performed prior to the initiation of the procedure. Patient was positioned supine on the image intensifier table. The drain was prepped and draped in the usual sterile fashion. Scout  images were acquired. Gentle contrast injection of the drain revealed and intraperitoneal location of the pigtail drainage catheter. The catheter was ligated and a wire was passed into the space. Kumpe the catheter was advanced on the wire. The [place] the catheter was injected with some gentle manipulation in attempt to find an existing tract into the gallbladder. Ultimately the catheter was removed and a sterile bandage was placed at the former access site. No drainage catheter at the completion of the study. No blood loss. No complications. IMPRESSION: Status post drain injection of a former percutaneous cholecystostomy revealing that the drain has been inadvertently withdrawn and removed from the lumen of the gallbladder. Given the recent placement, there is no mature tract for a replacement at this time. Drain was completely removed. Signed, Dulcy Fanny. Dellia Nims, RPVI [facility] Radiology Electronically Signed   By: Corrie Mckusick D.O.   On: 11/27/2020 08:31        Scheduled Meds: . (feeding supplement) PROSource Plus  30 mL Oral BID BM  . amiodarone  200 mg Oral Daily  . apixaban  5 mg Oral BID  . vitamin C  500 mg Oral BID  . calcium carbonate  1 tablet Oral TID  . Chlorhexidine Gluconate Cloth  6 each Topical Q0600  . cholecalciferol  1,000 Units Oral Daily  . collagenase   Topical Daily  . darbepoetin (ARANESP) injection - DIALYSIS  40 mcg  Intravenous Q Sat-HD  . docusate sodium  100 mg Oral BID  . insulin aspart  0-6 Units Subcutaneous TID WC  . iron polysaccharides  150 mg Oral Daily  . lactose free nutrition  237 mL Oral TID WC  . lidocaine  1 patch Transdermal Q24H  . midodrine  10 mg Oral Q T,Th,Sa-HD  . multivitamin with minerals  1 tablet Oral BID  . rosuvastatin  10 mg Oral Daily  . sodium chloride flush  10-40 mL Intracatheter Q12H  . sodium chloride flush  5 mL Intracatheter Q8H  . zinc sulfate  220 mg Oral Daily    Continuous Infusions: . sodium chloride    . vancomycin Stopped (11/27/20 0953)     LOS: 19 days    [name], MD, [place], [facility]. [facility]  To contact the attending provider between 7A-7P or the covering provider during after hours 7P-7A, please log into the web site [email or web address] and access using universal [place] password for that web site. If you do not have the password, please call the hospital operator.

## 2020-11-27 NOTE — Progress Notes (Signed)
PT Cancellation Note  Patient Details Name: Tom Scott MRN: [id number] DOB: 05-01-1956   Cancelled Treatment:    Reason Eval/Treat Not Completed: Patient at procedure or test/unavailable;Other (comment). Pt in HD all AM and now has just returned to bed from recliner. Will try again tomorrow.    [facility] 11/27/2020, 3:26 PM [place] Pager [phone] Office [phone]

## 2020-11-27 NOTE — Progress Notes (Signed)
[facility]ious [facility]  Date of Admission:  11/08/2020     Total days of antibiotics 19         ASSESSMENT:  Tom Scott aspiration cell counts are not consistent with septic arthritis and appears no further antibiotics are needed as cultures, despite gram stain with gram positive cocci, have been without growth to date. IR removed drain that was found to be in the peritoneum with no indications for additional drain placement at this time. Recommend to stop all antibiotics and monitor progress as it appears source control has been achieved and bilateral knees with completed I&D.   PLAN:  1. Discontinue antibiotics.  2. Monitor for continued progress  ID will sign off. Please re-consult as needed.   Principal Problem:   Septic shock ([place]) Active Problems:   Pressure injury of skin   Protein-calorie malnutrition, severe   Encounter for central line placement   AKI (acute kidney injury) ([place])   RUQ abdominal pain   Biliary sepsis   Elevated liver function tests   . (feeding supplement) PROSource Plus  30 mL Oral BID BM  . amiodarone  200 mg Oral Daily  . apixaban  5 mg Oral BID  . vitamin C  500 mg Oral BID  . calcium carbonate  1 tablet Oral TID  . Chlorhexidine Gluconate Cloth  6 each Topical Q0600  . cholecalciferol  1,000 Units Oral Daily  . collagenase   Topical Daily  . darbepoetin (ARANESP) injection - DIALYSIS  40 mcg Intravenous Q Sat-HD  . docusate sodium  100 mg Oral BID  . heparin sodium (porcine)      . insulin aspart  0-6 Units Subcutaneous TID WC  . iron polysaccharides  150 mg Oral Daily  . lactose free nutrition  237 mL Oral TID WC  . lidocaine  1 patch Transdermal Q24H  . midodrine  10 mg Oral Q T,Th,Sa-HD  . multivitamin with minerals  1 tablet Oral BID  . rosuvastatin  10 mg Oral Daily  . sodium chloride flush  10-40 mL Intracatheter Q12H  . sodium chloride flush  5 mL Intracatheter Q8H  . zinc sulfate  220 mg Oral Daily     SUBJECTIVE:  Afebrile overnight with no acute events. IR removed drain. Seen while in dialysis. Continues to feel better. Denies fevers, chills, or abdominal pain.   Allergies  Allergen Reactions  . Penicillamine     Other reaction(s): lips swell  . Penicillins     Other reaction(s): Swelling  Legacy System: CCA Onset Date: <blank> Substance Legacy/Cerner: penicillins / penicillins (Legacy value) Category: Drug Severity Legacy/Cerner: <blank> / Unknown Reaction(s): swelling Comments: <blank>  Legacy System: CCA Onset Date: <blank> Substance Legacy/Cerner: ampicillin / ampicillin (Legacy value) Category: Drug Severity Legacy/Cerner: <blank> / Unknown Reaction(s): swelling Comments: <blank>  Legacy System: CCA Onset Date: <blank> Substance Legacy/Cerner: ampicillin / ampicillin (Legacy value) Category: Drug Severity Legacy/Cerner: <blank> / Unknown Reaction(s): swelling Comments: <blank>  Legacy System: CCA Onset Date: <blank> Substance Legacy/Cerner: penicillins / penicillins (Legacy value) Category: Drug Severity Legacy/Cerner: <blank> / Unknown Reaction(s): swelling Comments: <blank>      Review of Systems: Review of Systems  Constitutional: Negative for chills, fever and weight loss.  Respiratory: Negative for cough, shortness of breath and wheezing.   Cardiovascular: Negative for chest pain and leg swelling.  Gastrointestinal: Negative for abdominal pain, constipation, diarrhea, nausea and vomiting.  Skin: Negative for rash.      OBJECTIVE: Vitals:   11/27/20  0845 11/27/20 0900 11/27/20 0930 11/27/20 1000  BP: (!) 99/53 (!) 98/52 (!) 101/54 (!) 94/44  Pulse: 88 94 90 92  Resp:      Temp:      TempSrc:      SpO2:      Weight:      Height:       Body mass index is 37.11 kg/m.  Physical Exam Constitutional:      General: He is not in acute distress.    Appearance: He is well-developed.     Comments: Lying in bed with head of bed  elevated; pleasant.   Cardiovascular:     Rate and Rhythm: Normal rate and regular rhythm.     Heart sounds: Normal heart sounds.  Pulmonary:     Effort: Pulmonary effort is normal.     Breath sounds: Normal breath sounds.  Skin:    General: Skin is warm and dry.  Neurological:     Mental Status: He is alert.  Psychiatric:        Mood and Affect: Mood normal.     Lab Results Lab Results  Component Value Date   WBC 10.6 (H) 11/27/2020   HGB 7.5 (L) 11/27/2020   HCT 23.9 (L) 11/27/2020   MCV 86.6 11/27/2020   PLT 491 (H) 11/27/2020    Lab Results  Component Value Date   CREATININE 6.38 (H) 11/27/2020   BUN 97 (H) 11/27/2020   NA 131 (L) 11/27/2020   K 4.1 11/27/2020   CL 97 (L) 11/27/2020   CO2 23 11/27/2020    Lab Results  Component Value Date   ALT 35 11/21/2020   AST 48 (H) 11/21/2020   ALKPHOS 139 (H) 11/21/2020   BILITOT 1.3 (H) 11/21/2020     Microbiology: Recent Results (from the past 240 hour(s))  Culture, blood (routine x 2)     Status: None   Collection Time: 11/18/20  4:57 PM   Specimen: BLOOD RIGHT HAND  Result Value Ref Range Status   Specimen Description BLOOD RIGHT HAND  Final   Special Requests   Final    BOTTLES DRAWN AEROBIC AND ANAEROBIC Blood Culture adequate volume   Culture   Final    NO GROWTH 5 DAYS Performed at [facility], [address] [place], [place] [address]    Report Status 11/23/2020 FINAL  Final  Culture, blood (routine x 2)     Status: None   Collection Time: 11/18/20  4:59 PM   Specimen: BLOOD LEFT HAND  Result Value Ref Range Status   Specimen Description BLOOD LEFT HAND  Final   Special Requests   Final    BOTTLES DRAWN AEROBIC AND ANAEROBIC Blood Culture adequate volume   Culture   Final    NO GROWTH 5 DAYS Performed at [facility], [place] [address] [place], [place] [address]    Report Status 11/23/2020 FINAL  Final  Body fluid culture w Gram Stain     Status: None (Preliminary result)    Collection Time: 11/23/20  7:31 PM   Specimen: Body Fluid  Result Value Ref Range Status   Specimen Description FLUID SYNOVIAL RIGHT KNEE  Final   Special Requests Immunocompromised  Final   Gram Stain   Final    RARE WBC PRESENT, PREDOMINANTLY MONONUCLEAR NO ORGANISMS SEEN    Culture   Final    NO GROWTH 3 DAYS Performed at [facility], [address], [place], [place] [address]  Report Status PENDING  Incomplete  Body fluid culture w Gram Stain     Status: None (Preliminary result)   Collection Time: 11/23/20  7:32 PM   Specimen: Body Fluid  Result Value Ref Range Status   Specimen Description FLUID SYNOVIAL LEFT KNEE  Final   Special Requests Immunocompromised  Final   Gram Stain   Final    FEW WBC PRESENT,BOTH PMN AND MONONUCLEAR RARE GRAM POSITIVE COCCI    Culture   Final    NO GROWTH 3 DAYS Performed at [facility], [place] [address], [place], [place] [address]    Report Status PENDING  Incomplete  Aerobic/Anaerobic Culture w Gram Stain (surgical/deep wound)     Status: None (Preliminary result)   Collection Time: 11/24/20 12:34 PM   Specimen: PATH Cytology Misc. fluid; Body Fluid  Result Value Ref Range Status   Specimen Description FLUID  Final   Special Requests RIGHT KNEE SYNOVIAL FLUID SPEC A  Final   Gram Stain   Final    FEW WBC PRESENT,BOTH PMN AND MONONUCLEAR NO ORGANISMS SEEN    Culture   Final    NO GROWTH 2 DAYS NO ANAEROBES ISOLATED; CULTURE IN PROGRESS FOR 5 DAYS Performed at [facility], [place] [address], [place], [place] [address]    Report Status PENDING  Incomplete  Aerobic/Anaerobic Culture w Gram Stain (surgical/deep wound)     Status: None (Preliminary result)   Collection Time: 11/24/20 12:36 PM   Specimen: Fluid  Result Value Ref Range Status   Specimen Description FLUID  Final   Special Requests LEFT KNEE SYNOVIAL SPEC B  Final   Gram Stain   Final    MODERATE WBC PRESENT,BOTH PMN AND MONONUCLEAR NO ORGANISMS SEEN     Culture   Final    NO GROWTH 2 DAYS NO ANAEROBES ISOLATED; CULTURE IN PROGRESS FOR 5 DAYS Performed at [facility], [place] [address], [place], [place] [address]    Report Status PENDING  Incomplete  Culture, blood (Routine X 2) w Reflex to ID Panel     Status: None (Preliminary result)   Collection Time: 11/24/20 10:03 PM   Specimen: BLOOD  Result Value Ref Range Status   Specimen Description BLOOD SITE NOT SPECIFIED  Final   Special Requests   Final    BOTTLES DRAWN AEROBIC ONLY Blood Culture results may not be optimal due to an inadequate volume of blood received in culture bottles   Culture   Final    NO GROWTH 2 DAYS Performed at [facility], [place] [address], [place], [place] [address]    Report Status PENDING  Incomplete  Culture, blood (Routine X 2) w Reflex to ID Panel     Status: None (Preliminary result)   Collection Time: 11/24/20 10:03 PM   Specimen: BLOOD  Result Value Ref Range Status   Specimen Description BLOOD SITE NOT SPECIFIED  Final   Special Requests   Final    BOTTLES DRAWN AEROBIC AND ANAEROBIC Blood Culture results may not be optimal due to an inadequate volume of blood received in culture bottles   Culture   Final    NO GROWTH 2 DAYS Performed at [facility], [place] [address] [place], [place] [address]    Report Status PENDING  Incomplete     Terri Piedra, [place] [facility]  11/27/2020  10:12 AM

## 2020-11-27 NOTE — Progress Notes (Signed)
PATIENT ID: Tom Scott  MRN: [id number]  DOB/AGE:  29-Jan-1956 / 65 y.o.  3 Days Post-Op Procedure(s) (LRB): ARTHROSCOPIC  DEBRIDMENT KNEE (Bilateral)  Subjective: Pain is mild, much better from yesterday. Patient reports that he is able to move the knees some now without pain. Has not been up with PT yet.   Objective: Vital signs in last 24 hours: Temp:  [89.9 F (32.2 C)-98.9 F (37.2 C)] 98.2 F (36.8 C) (06/07 1318) Pulse Rate:  [81-102] 100 (06/07 1318) Resp:  [12-20] 17 (06/07 1130) BP: (80-112)/(37-59) 105/59 (06/07 1318) SpO2:  [94 %-96 %] 96 % (06/07 1318) Weight:  [116.9 kg-121.1 kg] 116.9 kg (06/07 1102)  Intake/Output from previous day: 06/06 0701 - 06/07 0700 In: 200 [P.O.:200] Out: 100 [Drains:100] Intake/Output this shift: Total I/O In: 120 [P.O.:120] Out: 678 [Other:678]  Recent Labs    11/24/20 2203 11/27/20 0659  HGB 7.9* 7.5*   Recent Labs    11/24/20 2203 11/27/20 0659  WBC 14.3* 10.6*  RBC 2.90* 2.76*  HCT 25.0* 23.9*  PLT 434* 491*   Recent Labs    11/24/20 2203 11/27/20 0659  NA 132* 131*  K 4.7 4.1  CL 100 97*  CO2 19* 23  BUN 41* 97*  CREATININE 4.16* 6.38*  GLUCOSE 270* 187*  CALCIUM 8.1* 8.1*    Physical Exam: Dressing over knees C/D/I Sensation intact distally  Intact pulses distally Dorsiflexion/Plantar flexion intact Compartment soft 2+ edema in LE  Assessment/Plan: 3 Days Post-Op Procedure(s) (LRB): ARTHROSCOPIC  DEBRIDMENT KNEE (Bilateral)   Up with therapy Weight Bearing as Tolerated (WBAT)  VTE prophylaxis: Eliquis  Patient appears to be improving following bilateral knee scope I&D. ID has signed off, antibiotic stopped. Will remove sutures at bedside towards the end of the week if patient is still inpatient or have patient follow up early next week for suture removal.    Tom Scott 11/27/2020, 3:51 PM

## 2020-11-27 NOTE — Progress Notes (Signed)
   11/27/20 1130  Vitals  Temp 98.4 F (36.9 C)  Temp Source Oral  BP (!) 109/54  BP Location Left Arm  BP Method Automatic  Patient Position (if appropriate) Lying  Pulse Rate 95  Pulse Rate Source Monitor  Resp 17  Level of Consciousness  Level of Consciousness Alert  MEWS COLOR  MEWS Score Color Green  Oxygen Therapy  SpO2 95 %  O2 Device Room Air  MEWS Score  MEWS Temp 0  MEWS Systolic 0  MEWS Pulse 0  MEWS RR 0  MEWS LOC 0  MEWS Score 0  Patient arrived back to floor from Dialysis . Vitals above

## 2020-11-27 NOTE — Progress Notes (Signed)
Patient taken off floor during shift change to transfer to dialysis. Patients armband is on right wrist.

## 2020-11-27 NOTE — Care Management (Signed)
Spoke to patient and his wife at bedside. Discussed LTAC . Both in agreement for NCM to make referrals.   Made referrals to both LTAC's , [facility]   Await determinations.   Magdalen Spatz RN

## 2020-11-28 LAB — CBC
HCT: 24.5 % — ABNORMAL LOW (ref 39.0–52.0)
Hemoglobin: 7.4 g/dL — ABNORMAL LOW (ref 13.0–17.0)
MCH: 26.6 pg (ref 26.0–34.0)
MCHC: 30.2 g/dL (ref 30.0–36.0)
MCV: 88.1 fL (ref 80.0–100.0)
Platelets: 465 10*3/uL — ABNORMAL HIGH (ref 150–400)
RBC: 2.78 MIL/uL — ABNORMAL LOW (ref 4.22–5.81)
RDW: 19 % — ABNORMAL HIGH (ref 11.5–15.5)
WBC: 9.2 10*3/uL (ref 4.0–10.5)
nRBC: 0 % (ref 0.0–0.2)

## 2020-11-28 LAB — GLUCOSE, CAPILLARY
Glucose-Capillary: 153 mg/dL — ABNORMAL HIGH (ref 70–99)
Glucose-Capillary: 181 mg/dL — ABNORMAL HIGH (ref 70–99)
Glucose-Capillary: 222 mg/dL — ABNORMAL HIGH (ref 70–99)
Glucose-Capillary: 175 mg/dL — ABNORMAL HIGH (ref 70–99)

## 2020-11-28 LAB — VITAMIN A: Vitamin A (Retinoic Acid): 6.1 ug/dL — ABNORMAL LOW (ref 22.0–69.5)

## 2020-11-28 MED ORDER — NEPRO/CARBSTEADY PO LIQD
237.0000 mL | Freq: Two times a day (BID) | ORAL | Status: DC
  Administered 2020-11-28 – 2020-12-01 (×5): 237 mL via ORAL

## 2020-11-28 NOTE — Progress Notes (Signed)
Occupational Therapy Treatment Patient Details Name: Tom Scott MRN: [id number] DOB: 1956-04-07 Today's Date: 11/28/2020    History of present illness Tom Scott is a 65 y.o. M presented to [facility] with c/o chest pain and epigastric pain with concern for choledocholithiasis.  He was [facility] to [facility]08[facility]20 for ERCP and due to septic shock requiring vasopressor support.  He was admitted to the ICU, s/p perc cholecystomy tube placement on 5/20 with Dr. Annamaria Boots. Hospital course complicated by AKI, s/p tunneled HD catheter placement with Dr. Serafina Royals on 5/30, evidence of ESBL E. coli bacteremia during hospitalization. S/p bil arthroscopic debridment septic knees 6/4. PMH: Afib, HTN, DMII, HLD.   OT comments  Pt making incremental progress with OT goals. This session pt wanted to work on standing and sit  In the recliner, with PT and OT, pt attempted to stand with the stedy x3, however he was unable to lift his bottom from the bed higher than 2 inches. Pt then returned to supine and worked on bed mobility/ rolling in order to get on the [employer] pad. Pt required max A +2 for bed mobility. Pt then used maximove to transfer to the recliner. At this time pt still will benefit from skilled therapy after discharged to assist pt with strengthening and increasing functional mobility and ADL performance. Acute OT will continue to follow to assist pt with progressing his goals.    Follow Up Recommendations  SNF    Equipment Recommendations  3 in 1 bedside commode;Other (comment)    Recommendations for Other Services      Precautions / Restrictions Precautions Precautions: Fall;Other (comment) Precaution Comments: non-tunneled HD catheter left neck       Mobility Bed Mobility Overal bed mobility: Needs Assistance Bed Mobility: Rolling;Sidelying to Sit;Sit to Supine Rolling: Max assist Sidelying to sit: Max assist;+2 for physical assistance;HOB elevated   Sit to supine: Max  assist;+2 for safety/equipment;+2 for physical assistance   General bed mobility comments: Pt cued to flex bil knees to prepare for roll, min success. Pt cued to reach for bed rail with contralateral UE to pull to roll, maxA to complete. MaxAx2 to manage legs off EOB and to ascend trunk, providing verbal and tactile cues to lean anteriorly to push off elbow. Cues to return to leaning onto elbow to control trunk with descent back to supine, maxAx2.    Transfers Overall transfer level: Needs assistance Equipment used: Ambulation equipment used Transfers: Sit to/from Stand Sit to Stand: Max assist;+2 safety/equipment;+2 physical assistance;From elevated surface         General transfer comment: Prepared for transfer through cuing pt to scoot anteriorly to EOB with bed elevated to encourage weight bearing through lower extremities. Then cued pt to perform x3 mini sit <> stand from EOB pushing up with UEs on bed, minimally clearing buttocks each rep. Attempted to then perform full sit <> stand with use of stedy from EOB, cuing pt to gain momentum and pull with UEs, but pt unable to extend hips and knees to get into upright posture to descend stedy flaps even with maxAx2, x3 attempts. Ultimately returned pt to supine on lift pad to use maximove to transfer pt to recliner with TAx2.    Balance Overall balance assessment: Needs assistance Sitting-balance support: Feet supported;Bilateral upper extremity supported Sitting balance-Leahy Scale: Fair Sitting balance - Comments: Pt initially with posterior lean needing up to maxA, but progressed to min guard as he followed cues to correct his posture. Postural control:  Posterior lean Standing balance support: Bilateral upper extremity supported Standing balance-Leahy Scale: Zero Standing balance comment: Unable to come to full upright standing with maxAx2 and bil UE support.                           ADL either performed or assessed with  clinical judgement   ADL Overall ADL's : Needs assistance/impaired                         Toilet Transfer: Maximal assistance;+2 for physical assistance;+2 for safety/equipment Toilet Transfer Details (indicate cue type and reason): Attempted transfer with stedy         Functional mobility during ADLs: Maximal assistance;+2 for physical assistance;+2 for safety/equipment General ADL Comments: Pt is very weak this session, attempted trasnfer with stedy, pt unable to complete full standing at this time, used maximove to transfer to recliner.     Vision       Perception     Praxis      Cognition Arousal/Alertness: Awake/alert Behavior During Therapy: Flat affect Overall Cognitive Status: Impaired/Different from baseline Area of Impairment: Attention;Safety/judgement;Awareness;Problem solving                   Current Attention Level: Focused     Safety/Judgement: Decreased awareness of deficits;Decreased awareness of safety Awareness: Emergent Problem Solving: Decreased initiation;Difficulty sequencing;Requires verbal cues;Requires tactile cues General Comments: Pt needing increased time to process cues, sequence, and initiate tasks. Pt with poor insight into his deficits and poor ability to follow multi-step cues.        Exercises     Shoulder Instructions       General Comments VSS on RA    Pertinent Vitals/ Pain       Pain Assessment: Faces Faces Pain Scale: Hurts even more Pain Location: bil knees, R >L Pain Descriptors / Indicators: Discomfort;Tender;Grimacing;Guarding Pain Intervention(s): Limited activity within patient's tolerance;Monitored during session;Repositioned  Home Living                                          Prior Functioning/Environment              Frequency  Min 2X/week        Progress Toward Goals  OT Goals(current goals can now be found in the care plan section)  Progress towards OT  goals: Progressing toward goals  Acute Rehab OT Goals Patient Stated Goal: to get to the chair by standing OT Goal Formulation: With patient/family Time For Goal Achievement: 12/05/20 Potential to Achieve Goals: Fair ADL Goals Pt Will Perform Grooming: with min guard assist;sitting Pt Will Perform Upper Body Bathing: with min guard assist;sitting Pt Will Perform Upper Body Dressing: with min guard assist;sitting Additional ADL Goal #1: Pt will stand using Either a RW or Stedy for 1 min to prepare for standing ADL's.  Plan Discharge plan remains appropriate;Frequency remains appropriate    Co-evaluation    PT/OT/SLP Co-Evaluation/Treatment: Yes Reason for Co-Treatment: For patient/therapist safety;To address functional/ADL transfers PT goals addressed during session: Mobility/safety with mobility;Proper use of DME;Strengthening/ROM OT goals addressed during session: ADL's and self-care;Strengthening/ROM      AM-PAC OT "6 Clicks" Daily Activity     Outcome Measure   Help from another person eating meals?: A Little Help from another person taking care of personal  grooming?: A Little Help from another person toileting, which includes using toliet, bedpan, or urinal?: Total Help from another person bathing (including washing, rinsing, drying)?: Total Help from another person to put on and taking off regular upper body clothing?: A Lot Help from another person to put on and taking off regular lower body clothing?: Total 6 Click Score: 11    End of Session Equipment Utilized During Treatment: Other (comment) (Stedy and Maximove)  OT Visit Diagnosis: Unsteadiness on feet (R26.81);Other abnormalities of gait and mobility (R26.89);Muscle weakness (generalized) (M62.81)   Activity Tolerance Patient limited by fatigue   Patient Left in chair;with call bell/phone within reach;with chair alarm set;with family/visitor present   Nurse Communication Mobility status;Need for lift  equipment        Time: 1152-1222 OT Time Calculation (min): 30 min  Charges: OT General Charges $OT Visit: 1 Visit OT Treatments $Self Care/Home Management : 8-22 mins  Aariel Ems H., OTR/L Acute Rehabilitation  Amariah Kierstead Elane Yolanda Bonine 11/28/2020, 9:20 PM

## 2020-11-28 NOTE — Progress Notes (Signed)
PROGRESS NOTE  Tom Scott  [id number] DOB: 10-31-1955 DOA: 11/08/2020 PCP: Patient, No Pcp Per (Inactive)   Brief Narrative: Tom Scott is a 65 y.o. male with a history of atrial fibrillation, hypertension, diabetes mellitus, hyperlipidemia initially presented to [facility] secondary to chest pain and epigastric pain.  He developed septic shock requiring pressors.  Reported to have cholecystitis with choledocholithiasis and possible need for ERCP.  Also developed A. fib with RVR.  He was transferred to [facility] for ERCP and due to septic shock requiring vasopressor support.  He was admitted to the ICU.  General surgery and gastroenterology were consulted for management of his acute calculus cholecystitis.  IR consulted for percutaneous cholecystostomy and patient managed with empiric antibiotics.  While admitted, patient developed AKI/ATN requiring initiation of hemodialysis and no signs of renal recovery.  Also while admitted, patient was found to have evidence of ESBL E. coli bacteremia from outside hospital report and has completed 14-day course of meropenem, percutaneous cholecystostomy now removed and needs to follow-up with general surgery in 3 to 4 weeks for laparoscopic cholecystectomy consideration.  Hospital course further complicated by acute arthritis of bilateral knees, suspected septic arthritis based on a positive Gram stain, briefly on empiric IV vancomycin, s/p bilateral knee washout by orthopedics, cultures however negative, septic arthritis ruled out and all antibiotics discontinued.  Assessment & Plan: Principal Problem:   Septic shock ([place]) Active Problems:   Pressure injury of skin   Protein-calorie malnutrition, severe   Encounter for central line placement   AKI (acute kidney injury) ([place])   RUQ abdominal pain   Biliary sepsis   Elevated liver function tests  Septic shock, POA: Secondary to cholecystitis and ESBL E. coli bacteremia.  Patient required ICU admission with vasopressor support.  Resolved.  Acute calculus cholecystitis: General surgery and IR consulted. IR placed percutaneous cholecystostomy on 5/20.  LFTs have almost normalized. Fluid culture significant for E. Coli, in addition to rare bacteroides fragilis.  On 6/6, IR noted gallbladder drain had been withdrawn from the lumen of gallbladder into the peritoneum and there was no tract and hence drain was removed.  No indications for additional drain placement.  General surgery followed up, stable with no signs of acute cholecystitis, due to recent drain placement and inflammation would hold off 3 to 4 weeks prior to interval laparoscopic cholecystectomy, outpatient follow-up with general surgery.  GI evaluated for rising LFTs on 5/29, felt to be secondary to cholecystitis +/- amiodarone.  GI has since signed off on 5/31. Completed 14 days of meropenem.  ID signed off.    ESBL E. coli bacteremia: Report received from OSH per PCCM that reports ESBL bacteremia and patient completed 14 days of IV meropenem on 6/3.  Surveillance blood cultures (5/29) without growth.  Developed fever of 101.2 overnight 5/31.  RUE venous Doppler negative for DVT or SVT. Left IJ central line discontinued 6/4.  Acute on chronic bilateral knee arthritis Initially suspected septic arthritis based on a positive Gram stain in left knee, briefly on empiric IV vancomycin, s/p bilateral knee washout by orthopedics, cultures however negative, septic arthritis ruled out and all antibiotics discontinued by ID and ID signed off. Right knee synovial fluid from surgery: Extracellular CPPD crystals.  Left knee negative for crystals.  If does not improve?  Role for intra-articular steroids versus systemic steroids.  Uric acid normal.  WBAT.  Acute metabolic encephalopathy Secondary to uremia.  Resolved.  Acute kidney injury: Secondary to ATN secondary to septic  shock. Unclear whether this will remain  dialysis-dependent or have renal recovery.  - Nephrology following and TTS HD schedule while monitoring for renal recovery. Will trial in chair 6/9 which would allow [place] to search for outpatient options.   Atrial fibrillation with RVR Patient started on amiodarone for control. On Eliquis for stroke prevention.  Rate controlled.  Asymptomatic anemia Patient with unknown baseline. Hemoglobin of 11 on admission with slow downtrend. No obvious source of bleeding. - On oral iron and s/p Aranesp.  Likely related to critical illness.  S/p 2 units PRBC across HD on 6/2.  Trend CBCs and transfuse if hemoglobin 7 g or below.  Monitor labs across HD.  Hemoglobin again drifting down gradually/7.5.  Anasarca Secondary to fluid resuscitation with IV fluids in setting of kidney failure in addition to albuminemia. Current I/O of +17.4L.  Volume management across HD.  Still substantial volume overload with pitting edema in legs.  Essential hypertension Patient is on amlodipine, losartan and metoprolol as an outpatient. Antihypertensives held secondary to hypotension and septic shock.  Blood pressures either normal or soft.  Continue to hold antihypertensives.  Diabetes mellitus, type 2 Patient is on metformin as an outpatient.  Mildly uncontrolled.  Continue SSI and may need adjustment.  Hyperlipidemia -Continue Crestor  Severe malnutrition History of gastric bypass. Worse secondary to critical illness. NG tube inserted on 5/25 and patient started on tube feeds on 5/25. NG tube removed by patient on 5/28. -Oral intake -Dietitian recommendations:  TrialBoost Plus chocolateTID- Each supplement provides 360kcal and 14g protein.  30 ml ProSource Plus BID, each supplement provides 100 kcals and 15 grams protein.  MVI with minerals BID per tube  500 mg calcium carbonate TID per tube  Supplement Vitamin C 500 mgBID   Pressure injury Medial coccyx, not POA  Pressure Injury 11/12/20  Sacrum Medial Unstageable - Full thickness tissue loss in which the base of the injury is covered by slough (yellow, tan, gray, green or brown) and/or eschar (tan, brown or black) in the wound bed. (Active)  11/12/20 2000  Location: Sacrum  Location Orientation: Medial  Staging: Unstageable - Full thickness tissue loss in which the base of the injury is covered by slough (yellow, tan, gray, green or brown) and/or eschar (tan, brown or black) in the wound bed.  Wound Description (Comments):   Present on Admission: No    Diarrhea Likely multifactorial due to gallbladder disease and antibiotics.  Significantly improved.  Hypokalemia Resolved.  Right upper extremity pain and swelling ?  Related to asymmetric edema complicating underlying arthritis.  Venous Doppler negative for DVT or SVT.  Elevate and monitor.  Resolved  DVT prophylaxis: Eliquis Code Status:   Code Status: Full Code Family Communication: At bedside Disposition Plan: May need to consider LTAC given severe deconditioning and need for ongoing HD.    Consultants:   PCCM  Gastroenterology  General surgery  Interventional radiology  Nephrology  Orthopedic  Procedures:   As noted above.  Bilateral knee aspiration by orthopedics 6/3  Bilateral knee arthroscopic I&D in OR by orthopedic 6/4  Antimicrobials:  Flagyl-discontinued  Cefepime-discontinued  Meropenem-completed 6/3  Subjective: Feels diffusely weak but feels he's gaining some strength. Had about 50cc urine output today. Having some leg swelling, stable. No fevers. Has pain in the bottom when sitting.   Objective: Vitals:   11/27/20 1318 11/27/20 2147 11/28/20 0538 11/28/20 1329  BP: (!) 105/59 115/61 110/70 116/66  Pulse: 100 94 94 (!) 103  Resp:  17  19 16  Temp: 98.2 F (36.8 C) 99.1 F (37.3 C) 98.2 F (36.8 C) 98.8 F (37.1 C)  TempSrc: Oral Oral Oral Oral  SpO2: 96% 95% 96% 97%  Weight:   116 kg   Height:         Intake/Output Summary (Last 24 hours) at 11/28/2020 1410 Last data filed at 11/28/2020 1205 Gross per 24 hour  Intake 340 ml  Output --  Net 340 ml   Filed Weights   11/27/20 0723 11/27/20 1102 11/28/20 0538  Weight: 117.3 kg 116.9 kg 116 kg   Gen: Older male in no distress, appearing tired. Pulm: Non-labored breathing. Clear to auscultation bilaterally.  CV: Irreg irreg. No murmur, rub, or gallop. No JVD, 1+ symmetric pedal edema. GI: Abdomen soft, non-tender, non-distended, with normoactive bowel sounds. No organomegaly or masses felt. Ext: Warm, no deformities. Knees in ACE wraps bilaterally.  Skin: No rashes on visualized skin. Neuro: Alert and oriented. No focal neurological deficits. Psych: Judgement and insight appear normal. Mood & affect appropriate.   Data Reviewed: I have personally reviewed following labs and imaging studies  CBC: Recent Labs  Lab 11/23/20 0357 11/24/20 0237 11/24/20 2203 11/27/20 0659 11/28/20 0222  WBC 14.5* 12.1* 14.3* 10.6* 9.2  HGB 8.0* 7.7* 7.9* 7.5* 7.4*  HCT 24.9* 24.5* 25.0* 23.9* 24.5*  MCV 84.7 84.5 86.2 86.6 88.1  PLT 374 402* 434* 491* [id number]*   Basic Metabolic Panel: Recent Labs  Lab 11/22/20 0059 11/24/20 0237 11/24/20 2203 11/27/20 0659  NA 129* 132* 132* 131*  K 3.1* 3.1* 4.7 4.1  CL 95* 96* 100 97*  CO2 22 23 19* 23  GLUCOSE 160* 149* 270* 187*  BUN 54* 56* 41* 97*  CREATININE 5.65* 5.70* 4.16* 6.38*  CALCIUM 7.9* 8.1* 8.1* 8.1*  PHOS 6.4*  --  5.9* 6.3*   GFR: Estimated Creatinine Clearance: 14.3 mL/min (A) (by C-G formula based on SCr of 6.38 mg/dL (H)). Liver Function Tests: Recent Labs  Lab 11/22/20 0059 11/24/20 2203 11/27/20 0659  ALBUMIN 1.7* 2.0* 1.9*   No results for input(s): LIPASE, AMYLASE in the last 168 hours. No results for input(s): AMMONIA in the last 168 hours. Coagulation Profile: No results for input(s): INR, PROTIME in the last 168 hours. Cardiac Enzymes: No results for input(s):  CKTOTAL, CKMB, CKMBINDEX, TROPONINI in the last 168 hours. BNP (last 3 results) No results for input(s): PROBNP in the last 8760 hours. HbA1C: No results for input(s): HGBA1C in the last 72 hours. CBG: Recent Labs  Lab 11/27/20 1135 11/27/20 1700 11/27/20 2210 11/28/20 0755 11/28/20 1203  GLUCAP 124* 161* 158* 153* 181*   Lipid Profile: No results for input(s): CHOL, HDL, LDLCALC, TRIG, CHOLHDL, LDLDIRECT in the last 72 hours. Thyroid Function Tests: No results for input(s): TSH, T4TOTAL, FREET4, T3FREE, THYROIDAB in the last 72 hours. Anemia Panel: No results for input(s): VITAMINB12, FOLATE, FERRITIN, TIBC, IRON, RETICCTPCT in the last 72 hours. Urine analysis: No results found for: COLORURINE, APPEARANCEUR, LABSPEC, PHURINE, GLUCOSEU, HGBUR, BILIRUBINUR, KETONESUR, PROTEINUR, UROBILINOGEN, NITRITE, LEUKOCYTESUR Recent Results (from the past 240 hour(s))  Culture, blood (routine x 2)     Status: None   Collection Time: 11/18/20  4:57 PM   Specimen: BLOOD RIGHT HAND  Result Value Ref Range Status   Specimen Description BLOOD RIGHT HAND  Final   Special Requests   Final    BOTTLES DRAWN AEROBIC AND ANAEROBIC Blood Culture adequate volume   Culture   Final    NO GROWTH  5 DAYS Performed at [facility], [place] [address] [place], [place] [address]    Report Status 11/23/2020 FINAL  Final  Culture, blood (routine x 2)     Status: None   Collection Time: 11/18/20  4:59 PM   Specimen: BLOOD LEFT HAND  Result Value Ref Range Status   Specimen Description BLOOD LEFT HAND  Final   Special Requests   Final    BOTTLES DRAWN AEROBIC AND ANAEROBIC Blood Culture adequate volume   Culture   Final    NO GROWTH 5 DAYS Performed at [facility], [place] [address] [place], [place] [address]    Report Status 11/23/2020 FINAL  Final  Body fluid culture w Gram Stain     Status: None   Collection Time: 11/23/20  7:31 PM   Specimen: Body Fluid  Result Value Ref Range Status    Specimen Description FLUID SYNOVIAL RIGHT KNEE  Final   Special Requests Immunocompromised  Final   Gram Stain   Final    RARE WBC PRESENT, PREDOMINANTLY MONONUCLEAR NO ORGANISMS SEEN    Culture   Final    NO GROWTH 3 DAYS Performed at [facility], [address], [place], [place] [address]    Report Status 11/27/2020 FINAL  Final  Body fluid culture w Gram Stain     Status: None   Collection Time: 11/23/20  7:32 PM   Specimen: Body Fluid  Result Value Ref Range Status   Specimen Description FLUID SYNOVIAL LEFT KNEE  Final   Special Requests Immunocompromised  Final   Gram Stain   Final    FEW WBC PRESENT,BOTH PMN AND MONONUCLEAR RARE GRAM POSITIVE COCCI    Culture   Final    NO GROWTH 3 DAYS Performed at [facility], [place] [address], [place], [place] [address]    Report Status 11/27/2020 FINAL  Final  Aerobic/Anaerobic Culture w Gram Stain (surgical/deep wound)     Status: None (Preliminary result)   Collection Time: 11/24/20 12:34 PM   Specimen: PATH Cytology Misc. fluid; Body Fluid  Result Value Ref Range Status   Specimen Description FLUID  Final   Special Requests RIGHT KNEE SYNOVIAL FLUID SPEC A  Final   Gram Stain   Final    FEW WBC PRESENT,BOTH PMN AND MONONUCLEAR NO ORGANISMS SEEN    Culture   Final    NO GROWTH 4 DAYS NO ANAEROBES ISOLATED; CULTURE IN PROGRESS FOR 5 DAYS Performed at [facility], [place] [address] [place], [place] [address]    Report Status PENDING  Incomplete  Aerobic/Anaerobic Culture w Gram Stain (surgical/deep wound)     Status: None (Preliminary result)   Collection Time: 11/24/20 12:36 PM   Specimen: Fluid  Result Value Ref Range Status   Specimen Description FLUID  Final   Special Requests LEFT KNEE SYNOVIAL SPEC B  Final   Gram Stain   Final    MODERATE WBC PRESENT,BOTH PMN AND MONONUCLEAR NO ORGANISMS SEEN    Culture   Final    NO GROWTH 4 DAYS NO ANAEROBES ISOLATED; CULTURE IN PROGRESS FOR 5 DAYS Performed at  [facility], [place] [address], [place], [place] [address]    Report Status PENDING  Incomplete  Culture, blood (Routine X 2) w Reflex to ID Panel     Status: None (Preliminary result)   Collection Time: 11/24/20 10:03 PM   Specimen: BLOOD  Result Value Ref Range Status  Specimen Description BLOOD SITE NOT SPECIFIED  Final   Special Requests   Final    BOTTLES DRAWN AEROBIC ONLY Blood Culture results may not be optimal due to an inadequate volume of blood received in culture bottles   Culture   Final    NO GROWTH 3 DAYS Performed at [facility], [place] [address], [place], [place] [address]    Report Status PENDING  Incomplete  Culture, blood (Routine X 2) w Reflex to ID Panel     Status: None (Preliminary result)   Collection Time: 11/24/20 10:03 PM   Specimen: BLOOD  Result Value Ref Range Status   Specimen Description BLOOD SITE NOT SPECIFIED  Final   Special Requests   Final    BOTTLES DRAWN AEROBIC AND ANAEROBIC Blood Culture results may not be optimal due to an inadequate volume of blood received in culture bottles   Culture   Final    NO GROWTH 3 DAYS Performed at [facility], [place] [address] [place], [place] [address]    Report Status PENDING  Incomplete      Radiology Studies: IR Sinus/Fist Tube Chk-Non GI  Result Date: 11/27/2020 INDICATION: 65 year old male with a history of leakage at the skin of a percutaneous cholecystostomy. He presents for drain injection EXAM: IMAGE GUIDED DRAIN INJECTION MEDICATIONS: None ANESTHESIA/SEDATION: None COMPLICATIONS: None PROCEDURE: Informed written consent was obtained from the patient after a thorough discussion of the procedural risks, benefits and alternatives. All questions were addressed. Maximal Sterile Barrier Technique was utilized including caps, mask, sterile gowns, sterile gloves, sterile drape, hand hygiene and skin antiseptic. A timeout was performed prior to the initiation of the procedure. Patient was  positioned supine on the image intensifier table. The drain was prepped and draped in the usual sterile fashion. Scout images were acquired. Gentle contrast injection of the drain revealed and intraperitoneal location of the pigtail drainage catheter. The catheter was ligated and a wire was passed into the space. [place] the catheter was advanced on the wire. The [place] the catheter was injected with some gentle manipulation in attempt to find an existing tract into the gallbladder. Ultimately the catheter was removed and a sterile bandage was placed at the former access site. No drainage catheter at the completion of the study. No blood loss. No complications. IMPRESSION: Status post drain injection of a former percutaneous cholecystostomy revealing that the drain has been inadvertently withdrawn and removed from the lumen of the gallbladder. Given the recent placement, there is no mature tract for a replacement at this time. Drain was completely removed. Signed, Dulcy Fanny. Dellia Nims, RPVI [facility] Rad[facility] Electronically Signed   By: Corrie Mckusick D.O.   On: 11/27/2020 08:31    Scheduled Meds: . (feeding supplement) PROSource Plus  30 mL Oral BID BM  . amiodarone  200 mg Oral Daily  . apixaban  5 mg Oral BID  . vitamin C  500 mg Oral BID  . calcium carbonate  1 tablet Oral TID  . Chlorhexidine Gluconate Cloth  6 each Topical Q0600  . cholecalciferol  1,000 Units Oral Daily  . collagenase   Topical Daily  . darbepoetin (ARANESP) injection - DIALYSIS  40 mcg Intravenous Q Sat-HD  . docusate sodium  100 mg Oral BID  . insulin aspart  0-6 Units Subcutaneous TID WC  . iron polysaccharides  150 mg Oral Daily  . lactose free nutrition  237 mL Oral TID WC  .  lidocaine  1 patch Transdermal Q24H  . midodrine  10 mg Oral Q T,Th,Sa-HD  . multivitamin with minerals  1 tablet Oral BID  . rosuvastatin  10 mg Oral Daily  . sodium chloride flush  10-40 mL  Intracatheter Q12H  . sodium chloride flush  5 mL Intracatheter Q8H  . zinc sulfate  220 mg Oral Daily   Continuous Infusions: . sodium chloride       LOS: 20 days   Time spent: 25 minutes.  Patrecia Pour, MD [facility] [email or web address] 11/28/2020, 2:10 PM

## 2020-11-28 NOTE — Care Management (Signed)
Spoke to [place] with admissions for [facility]. Patient has be to able to sit in chair and tolerate a complete HD treatment for [facility]. It has to be a regular chair chair , [place] chairs do not count.   Awaiting [facility]'s determination.  Magdalen Spatz RN

## 2020-11-28 NOTE — Progress Notes (Addendum)
Physical Therapy Treatment Patient Details Name: Tom Scott MRN: [id number] DOB: 07-12-1955 Today's Date: 11/28/2020    History of Present Illness Tom Scott is a 65 y.o. M presented to [facility] with c/o chest pain and epigastric pain with concern for choledocholithiasis.  He was transferred to [facility] on 11/08/20 for ERCP and due to septic shock requiring vasopressor support.  He was admitted to the ICU, s/p perc cholecystomy tube placement on 5/20 with Dr. Annamaria Boots. Hospital course complicated by AKI, s/p tunneled HD catheter placement with Dr. Serafina Royals on 5/30, evidence of ESBL E. coli bacteremia during hospitalization. S/p bil arthroscopic debridment septic knees 6/4. PMH: Afib, HTN, DMII, HLD.    PT Comments    Treated pt in conjunction with OT as pt unlikely to tolerate x2 sessions. Focused session on addressing his limitations in bil knee extension PROM and AROM through performing exercises and education on exercises pt needs to be performing outside of therapy sessions. This lack of knee extension ROM and quads strength impacts his ability to come to stand. Practiced x3 minimal buttocks clearance from EOB prior to attempt to utilize stedy with pt still unable to achieve adequate hip and knee extension to use stedy with maxAx2. Pt ultimately transferred to chair from bed with use of maximove this date. However, pt is beginning to tolerate weight bearing through his legs and is motivated to improve. Will continue to follow acutely. Current recommendations remain appropriate.   Follow Up Recommendations  SNF; [facility]     Equipment Recommendations  Wheelchair cushion (measurements PT);Wheelchair (measurements PT);Hospital bed;3in1 (PT) (hoyer; need further assessement next venue)    Recommendations for Other Services       Precautions / Restrictions Precautions Precautions: Fall;Other (comment) Precaution Comments: non-tunneled HD catheter left neck Restrictions Weight  Bearing Restrictions: No    Mobility  Bed Mobility Overal bed mobility: Needs Assistance Bed Mobility: Rolling;Sidelying to Sit;Sit to Supine Rolling: Max assist Sidelying to sit: Max assist;+2 for physical assistance;HOB elevated   Sit to supine: Max assist;+2 for safety/equipment;+2 for physical assistance   General bed mobility comments: Pt cued to flex bil knees to prepare for roll, min success. Pt cued to reach for bed rail with contralateral UE to pull to roll, maxA to complete. MaxAx2 to manage legs off EOB and to ascend trunk, providing verbal and tactile cues to lean anteriorly to push off elbow. Cues to return to leaning onto elbow to control trunk with descent back to supine, maxAx2.    Transfers Overall transfer level: Needs assistance Equipment used: Ambulation equipment used Transfers: Sit to/from Stand Sit to Stand: Max assist;+2 safety/equipment;+2 physical assistance;From elevated surface         General transfer comment: Prepared for transfer through cuing pt to scoot anteriorly to EOB with bed elevated to encourage weight bearing through lower extremities. Then cued pt to perform x3 mini sit <> stand from EOB pushing up with UEs on bed, minimally clearing buttocks each rep. Attempted to then perform full sit <> stand with use of stedy from EOB, cuing pt to gain momentum and pull with UEs, but pt unable to extend hips and knees to get into upright posture to descend stedy flaps even with maxAx2, x3 attempts. Ultimately returned pt to supine on lift pad to use maximove to transfer pt to recliner with TAx2.  Ambulation/Gait             General Gait Details: Unable   Stairs  Wheelchair Mobility    Modified Rankin (Stroke Patients Only)       Balance Overall balance assessment: Needs assistance Sitting-balance support: Feet supported;Bilateral upper extremity supported Sitting balance-Leahy Scale: Poor Sitting balance - Comments: Pt  initially with posterior lean needing up to maxA, but progressed to min guard as he followed cues to correct his posture. Postural control: Posterior lean Standing balance support: Bilateral upper extremity supported Standing balance-Leahy Scale: Zero Standing balance comment: Unable to come to full upright standing with maxAx2 and bil UE support.                            Cognition Arousal/Alertness: Awake/alert Behavior During Therapy: Flat affect Overall Cognitive Status: Impaired/Different from baseline Area of Impairment: Attention;Safety/judgement;Awareness;Problem solving                   Current Attention Level: Focused     Safety/Judgement: Decreased awareness of deficits;Decreased awareness of safety Awareness: Emergent Problem Solving: Decreased initiation;Difficulty sequencing;Requires verbal cues;Requires tactile cues General Comments: Pt needing increased time to process cues, sequence, and initiate tasks. Pt with poor insight into his deficits and poor ability to follow multi-step cues.      Exercises General Exercises - Lower Extremity Quad Sets: Both;Other reps (comment);Seated (x3 reps with legs elevated in recliner) Long Arc Quad: Both;5 reps;Seated    General Comments        Pertinent Vitals/Pain Pain Assessment: Faces Faces Pain Scale: Hurts even more Pain Location: bil knees, R >L Pain Descriptors / Indicators: Discomfort;Tender;Grimacing;Guarding Pain Intervention(s): Limited activity within patient's tolerance;Monitored during session;Repositioned    Home Living                      Prior Function            PT Goals (current goals can now be found in the care plan section) Acute Rehab PT Goals Patient Stated Goal: to get up to chair without the mechanical lift PT Goal Formulation: With patient/family Time For Goal Achievement: 12/02/20 Potential to Achieve Goals: Good Progress towards PT goals: Progressing toward  goals    Frequency    Min 2X/week      PT Plan Current plan remains appropriate    Co-evaluation PT/OT/SLP Co-Evaluation/Treatment: Yes Reason for Co-Treatment: For patient/therapist safety;To address functional/ADL transfers PT goals addressed during session: Mobility/safety with mobility;Proper use of DME;Strengthening/ROM        AM-PAC PT "6 Clicks" Mobility   Outcome Measure  Help needed turning from your back to your side while in a flat bed without using bedrails?: A Lot Help needed moving from lying on your back to sitting on the side of a flat bed without using bedrails?: Total Help needed moving to and from a bed to a chair (including a wheelchair)?: Total Help needed standing up from a chair using your arms (e.g., wheelchair or bedside chair)?: Total Help needed to walk in hospital room?: Total Help needed climbing 3-5 steps with a railing? : Total 6 Click Score: 7    End of Session Equipment Utilized During Treatment: Other (comment);Gait belt (bed transfer pads) Activity Tolerance: Patient limited by pain;Patient limited by fatigue Patient left: with call bell/phone within reach;with family/visitor present;in chair Nurse Communication: Mobility status;Need for lift equipment (no chair alarm under pt) PT Visit Diagnosis: Other abnormalities of gait and mobility (R26.89);Muscle weakness (generalized) (M62.81);Pain;Difficulty in walking, not elsewhere classified (R26.2) Pain - Right/Left:  (bil) Pain -  part of body: Knee     Time: [id number] PT Time Calculation (min) (ACUTE ONLY): 44 min  Charges:  $Therapeutic Exercise: 8-22 mins $Therapeutic Activity: 8-22 mins                     Moishe Spice, PT, DPT Acute Rehabilitation Services  Pager: [phone] Office: [phone]    Orvan Falconer 11/28/2020, 12:48 PM

## 2020-11-28 NOTE — Progress Notes (Signed)
Patient ID: Tom Scott, male   DOB: January 14, 1956, 65 y.o.   MRN: [id number] S: No complaints O:BP 116/66 (BP Location: Left Arm)   Pulse (!) 103   Temp 98.8 F (37.1 C) (Oral)   Resp 16   Ht '5\' 10"'$  (1.778 m)   Wt 116 kg   SpO2 97%   BMI 36.69 kg/m   Intake/Output Summary (Last 24 hours) at 11/28/2020 1357 Last data filed at 11/28/2020 1205 Gross per 24 hour  Intake 340 ml  Output --  Net 340 ml   Intake/Output: I/O last 3 completed shifts: In: 120 [P.O.:120] Out: 678 [Other:678]  Intake/Output this shift:  Total I/O In: 340 [P.O.:340] Out: -  Weight change: -3.8 kg Gen: NAD CVS: tachy at 103 Resp: CTA Abd: benign Ext: 2+ edema BLE  Recent Labs  Lab 11/22/20 0059 11/24/20 0237 11/24/20 2203 11/27/20 0659  NA 129* 132* 132* 131*  K 3.1* 3.1* 4.7 4.1  CL 95* 96* 100 97*  CO2 22 23 19* 23  GLUCOSE 160* 149* 270* 187*  BUN 54* 56* 41* 97*  CREATININE 5.65* 5.70* 4.16* 6.38*  ALBUMIN 1.7*  --  2.0* 1.9*  CALCIUM 7.9* 8.1* 8.1* 8.1*  PHOS 6.4*  --  5.9* 6.3*   Liver Function Tests: Recent Labs  Lab 11/22/20 0059 11/24/20 2203 11/27/20 0659  ALBUMIN 1.7* 2.0* 1.9*   No results for input(s): LIPASE, AMYLASE in the last 168 hours. No results for input(s): AMMONIA in the last 168 hours. CBC: Recent Labs  Lab 11/23/20 0357 11/24/20 0237 11/24/20 2203 11/27/20 0659 11/28/20 0222  WBC 14.5* 12.1* 14.3* 10.6* 9.2  HGB 8.0* 7.7* 7.9* 7.5* 7.4*  HCT 24.9* 24.5* 25.0* 23.9* 24.5*  MCV 84.7 84.5 86.2 86.6 88.1  PLT 374 402* 434* 491* 465*   Cardiac Enzymes: No results for input(s): CKTOTAL, CKMB, CKMBINDEX, TROPONINI in the last 168 hours. CBG: Recent Labs  Lab 11/27/20 1135 11/27/20 1700 11/27/20 2210 11/28/20 0755 11/28/20 1203  GLUCAP 124* 161* 158* 153* 181*    Iron Studies: No results for input(s): IRON, TIBC, TRANSFERRIN, FERRITIN in the last 72 hours. Studies/Results: IR Sinus/Fist Tube Chk-Non GI  Result Date: 11/27/2020 INDICATION:  65 year old male with a history of leakage at the skin of a percutaneous cholecystostomy. He presents for drain injection EXAM: IMAGE GUIDED DRAIN INJECTION MEDICATIONS: None ANESTHESIA/SEDATION: None COMPLICATIONS: None PROCEDURE: Informed written consent was obtained from the patient after a thorough discussion of the procedural risks, benefits and alternatives. All questions were addressed. Maximal Sterile Barrier Technique was utilized including caps, mask, sterile gowns, sterile gloves, sterile drape, hand hygiene and skin antiseptic. A timeout was performed prior to the initiation of the procedure. Patient was positioned supine on the image intensifier table. The drain was prepped and draped in the usual sterile fashion. Scout images were acquired. Gentle contrast injection of the drain revealed and intraperitoneal location of the pigtail drainage catheter. The catheter was ligated and a wire was passed into the space. Kumpe the catheter was advanced on the wire. The Kumpe the catheter was injected with some gentle manipulation in attempt to find an existing tract into the gallbladder. Ultimately the catheter was removed and a sterile bandage was placed at the former access site. No drainage catheter at the completion of the study. No blood loss. No complications. IMPRESSION: Status post drain injection of a former percutaneous cholecystostomy revealing that the drain has been inadvertently withdrawn and removed from the lumen of the gallbladder. Given the recent  placement, there is no mature tract for a replacement at this time. Drain was completely removed. Signed, Dulcy Fanny. Dellia Nims, RPVI [facility] Radiology Electronically Signed   By: Corrie Mckusick D.O.   On: 11/27/2020 08:31   . (feeding supplement) PROSource Plus  30 mL Oral BID BM  . amiodarone  200 mg Oral Daily  . apixaban  5 mg Oral BID  . vitamin C  500 mg Oral BID  . calcium carbonate  1  tablet Oral TID  . Chlorhexidine Gluconate Cloth  6 each Topical Q0600  . cholecalciferol  1,000 Units Oral Daily  . collagenase   Topical Daily  . darbepoetin (ARANESP) injection - DIALYSIS  40 mcg Intravenous Q Sat-HD  . docusate sodium  100 mg Oral BID  . insulin aspart  0-6 Units Subcutaneous TID WC  . iron polysaccharides  150 mg Oral Daily  . lactose free nutrition  237 mL Oral TID WC  . lidocaine  1 patch Transdermal Q24H  . midodrine  10 mg Oral Q T,Th,Sa-HD  . multivitamin with minerals  1 tablet Oral BID  . rosuvastatin  10 mg Oral Daily  . sodium chloride flush  10-40 mL Intracatheter Q12H  . sodium chloride flush  5 mL Intracatheter Q8H  . zinc sulfate  220 mg Oral Daily    BMET    Component Value Date/Time   NA 131 (L) 11/27/2020 0659   K 4.1 11/27/2020 0659   CL 97 (L) 11/27/2020 0659   CO2 23 11/27/2020 0659   GLUCOSE 187 (H) 11/27/2020 0659   BUN 97 (H) 11/27/2020 0659   CREATININE 6.38 (H) 11/27/2020 0659   CALCIUM 8.1 (L) 11/27/2020 0659   GFRNONAA 9 (L) 11/27/2020 0659   CBC    Component Value Date/Time   WBC 9.2 11/28/2020 0222   RBC 2.78 (L) 11/28/2020 0222   HGB 7.4 (L) 11/28/2020 0222   HCT 24.5 (L) 11/28/2020 0222   PLT 465 (H) 11/28/2020 0222   MCV 88.1 11/28/2020 0222   MCH 26.6 11/28/2020 0222   MCHC 30.2 11/28/2020 0222   RDW 19.0 (H) 11/28/2020 0222     Assessment/Plan:  1. Acute kidney Injury:Secondary to septic shock/ATN and likely exacerbated by hemodynamic insult from atrial fibrillation with RVR. No known baseline Scr but was 1.99 at time of presentation.  Started on hemodialysis on 5/23 and at this time he remains anuric andwithout any evidence of renal recovery. Continue hemodialysis on a TTS schedule while monitoring for renal recovery. 1. Unfortunately no significant increase in UOP  2. Tunneled HD catheter placed 11/19/20 3. Hypotensive on HD so will add midodrine 10 mg before HD moving forward.  2. Calculus cholecystitis  with E. coli bacteremia:Status post percutaneous cholecystostomy on 5/20 with suspected amiodarone associated elevation of LFTs.  3. Atrial fibrillation with rapid ventricular response:Rate control fair on amiodarone, likely compounded by infection from septic arthritis. 4. Microcytic anemia:On oral iron and status post Aranesp-likely compounded by acute/critical illness. PRBC transfusion when hemoglobin <7.0. 5. Physical deconditioning: Secondary to acute illness andnow limited by bilateral knee pain/swelling from septic arthritis. 6. Septic arthritis:Seen by orthopedic surgery for concerns of knee swelling 2 days ago with aspiration revealing cloudy aspirate in both knees with few GPC from the left knee; consequently underwent arthroscopic debridement of both knees yesterday. Surgical cultures pending while on intravenous vancomycin. 7. Moderate protein malnutrition: Presumably due to sepsis. 8. Deconditioning - pt is too weak for outpatient HD  and will benefit from LTC placement with daily PT/OT.    Donetta Potts, MD [employer] [phone]

## 2020-11-29 LAB — AEROBIC/ANAEROBIC CULTURE W GRAM STAIN (SURGICAL/DEEP WOUND)
Culture: NO GROWTH
Culture: NO GROWTH

## 2020-11-29 LAB — RENAL FUNCTION PANEL
Albumin: 2.1 g/dL — ABNORMAL LOW (ref 3.5–5.0)
Anion gap: 11 (ref 5–15)
BUN: 65 mg/dL — ABNORMAL HIGH (ref 8–23)
CO2: 26 mmol/L (ref 22–32)
Calcium: 8.2 mg/dL — ABNORMAL LOW (ref 8.9–10.3)
Chloride: 96 mmol/L — ABNORMAL LOW (ref 98–111)
Creatinine, Ser: 5.3 mg/dL — ABNORMAL HIGH (ref 0.61–1.24)
GFR, Estimated: 11 mL/min — ABNORMAL LOW (ref >60)
Glucose, Bld: 180 mg/dL — ABNORMAL HIGH (ref 70–99)
Phosphorus: 4.6 mg/dL (ref 2.5–4.6)
Potassium: 3.4 mmol/L — ABNORMAL LOW (ref 3.5–5.1)
Sodium: 133 mmol/L — ABNORMAL LOW (ref 135–145)

## 2020-11-29 LAB — CBC
HCT: 24.5 % — ABNORMAL LOW (ref 39.0–52.0)
Hemoglobin: 7.7 g/dL — ABNORMAL LOW (ref 13.0–17.0)
MCH: 27.7 pg (ref 26.0–34.0)
MCHC: 31.4 g/dL (ref 30.0–36.0)
MCV: 88.1 fL (ref 80.0–100.0)
Platelets: 454 10*3/uL — ABNORMAL HIGH (ref 150–400)
RBC: 2.78 MIL/uL — ABNORMAL LOW (ref 4.22–5.81)
RDW: 19 % — ABNORMAL HIGH (ref 11.5–15.5)
WBC: 10.6 10*3/uL — ABNORMAL HIGH (ref 4.0–10.5)
nRBC: 0 % (ref 0.0–0.2)

## 2020-11-29 LAB — GLUCOSE, CAPILLARY
Glucose-Capillary: 177 mg/dL — ABNORMAL HIGH (ref 70–99)
Glucose-Capillary: 170 mg/dL — ABNORMAL HIGH (ref 70–99)
Glucose-Capillary: 130 mg/dL — ABNORMAL HIGH (ref 70–99)
Glucose-Capillary: 180 mg/dL — ABNORMAL HIGH (ref 70–99)

## 2020-11-29 MED ORDER — ALBUMIN HUMAN 25 % IV SOLN
INTRAVENOUS | Status: AC
  Administered 2020-11-29: 12.5 g
  Filled 2020-11-29: qty 50

## 2020-11-29 MED ORDER — VITAMIN A 3 MG (10000 UNIT) PO CAPS
100000.0000 [IU] | ORAL_CAPSULE | Freq: Every day | ORAL | Status: AC
  Administered 2020-11-30 – 2020-12-01 (×2): 100000 [IU] via ORAL
  Filled 2020-11-29 (×3): qty 10

## 2020-11-29 MED ORDER — VITAMIN A 3 MG (10000 UNIT) PO CAPS
50000.0000 [IU] | ORAL_CAPSULE | Freq: Every day | ORAL | Status: DC
  Filled 2020-11-29: qty 5

## 2020-11-29 MED ORDER — HEPARIN SODIUM (PORCINE) 1000 UNIT/ML IJ SOLN
INTRAMUSCULAR | Status: AC
  Administered 2020-11-29: 1000 [IU]
  Filled 2020-11-29: qty 4

## 2020-11-29 NOTE — Progress Notes (Addendum)
[facility] PROGRESS NOTE  Tom Scott [id number] DOB: Apr 12, 1956 DOA: 11/08/2020 PCP: Patient, No Pcp Per (Inactive)  Status: Remains inpatient appropriate because:Persistent severe electrolyte disturbances, Unsafe d/c plan, Inpatient level of care appropriate due to severity of illness, and still requiring HD  which is a new problem  Dispo: The patient is from: Home              Anticipated d/c is to: LTAC              Patient currently is not medically stable to d/c.   Difficult to place patient Yes  Level of care: Telemetry Medical  Code Status:  Family Communication:  DVT prophylaxis:  COVID vaccination status: Has received both Moderna vaccines as well as 1 booster    HPI: 65 y.o. male with a history of atrial fibrillation, hypertension, diabetes mellitus, hyperlipidemia initially presented to [facility] secondary to chest pain and epigastric pain.  He developed septic shock requiring pressors.  Reported to have cholecystitis with choledocholithiasis and possible need for ERCP.  Also developed A. fib with RVR.  He was transferred to [facility] for ERCP and due to septic shock requiring vasopressor support.  He was admitted to the ICU.  General surgery and gastroenterology were consulted for management of his acute calculus cholecystitis.  IR consulted for percutaneous cholecystostomy and and he was managed with empiric antibiotics.    Hospital course has been complicated by AKI/ATN requiring initiation of hemodialysis and no signs of renal recovery.  He was found to have evidence of ESBL E. coli bacteremia from outside hospital report and has completed 14-day course of meropenem, percutaneous cholecystostomy now removed and needs to follow-up with general surgery in 3 to 4 weeks for laparoscopic cholecystectomy consideration.  Hospital course further complicated by suspected septic arthritis bilateral knees based on a positive Gram stain, briefly on  empiric IV vancomycin, s/p bilateral knee washout by orthopedics, cultures however negative, septic arthritis ruled out and all antibiotics discontinued.  Continues to have significant lower extremity swelling and knee pain that has prohibited mobilization and then of recent has required a Hoyer lift to get out of bed.  Tolerates less than 2 hours of out of bed to chair.  Subjective: Patient alert.  Without any plaints or request.  States he is feeling okay.  Does admit to not being very hungry.  Objective: Vitals:   11/28/20 2235 11/29/20 0530  BP: 100/77 126/71  Pulse: 93 95  Resp: 18 17  Temp: 98.7 F (37.1 C) 98.2 F (36.8 C)  SpO2: 97% 97%    Intake/Output Summary (Last 24 hours) at 11/29/2020 0831 Last data filed at 11/29/2020 0730 Gross per 24 hour  Intake 610 ml  Output 125 ml  Net 485 ml   Filed Weights   11/27/20 1102 11/28/20 0538 11/29/20 0530  Weight: 116.9 kg 116 kg 118.5 kg    Exam:  Constitutional: NAD, calm, uncomfortable secondary to ongoing knee pain Respiratory: clear to auscultation bilaterally, no wheezing, no crackles. Normal respiratory effort. No accessory muscle use.  Room air Cardiovascular: Irregular rate, in atrial fibrillation with controlled ventricular response, no murmurs / rubs / gallops.  Marked bilateral lower extremity edema extending from toes to just above the knees-2-3+; bilateral pedal pulses 1+ Abdomen: no tenderness, no masses palpated.  Bowel sounds positive.  Variable oral intake. LBM 6/8 Musculoskeletal: Bilateral knees wrapped with Ace wrap to aid in stability secondary to ongoing arthritic pain. Neurologic: CN 2-12 grossly  intact. Sensation intact, DTR normal. Strength 5/5 extremities and 3/5 on the lower extremities with activity and mobility limited by bilateral knee pain as well as extensive lower extremity edema Psychiatric: Normal judgment and insight. Alert and oriented x 3. Normal mood.    Assessment/Plan: Acute  problems:  Septic shock, POA/ ESBL E. coli bacteremia:  Sepsis physiology has resolved Secondary to cholecystitis and ESBL E. coli bacteremia.  Patient required ICU admission with vasopressor support.   Initial positive cultures were received from outside hospital confirming ESBL E. coli bacteremia Follow-up blood cultures on 5/29 without growth   Acute calculus cholecystitis:  Initially required percutaneous cholecystostomy tube to temporize until patient stabilized and signs of acute cholecystitis resolved.  The drain actually became dislodged and since patient was stable was not replaced Patient will need eventual laparoscopic cholecystectomy once medically stable.  This can be accomplished in the outpatient setting per general surgery Did have issues of rising LFTs on 5/29.  GI was consulted and this was felt to be secondary to concomitant administration of amiodarone in the context of acute cholecystitis.  Tees have since normalized Secondary bacteremia from acute cholecystitis treated with 14 days of meropenem per recommendations of ID and ID has subsequently signed off.   Acute on chronic bilateral knee arthritis/significant bilateral lower extremity edema Initially suspected septic arthritis based on a positive Gram stain in left knee Briefly on empiric IV vancomycin, s/p bilateral knee washout by orthopedics-cultures negative, septic arthritis ruled out  Antibiotics discontinued by ID   Right knee synovial fluid from surgery: Extracellular CPPD crystals.  Left knee negative for crystals.  Symptom management/pain management Begin Ace wraps from feet to above knees to help with mobilizing edema Lites edema somewhat improved after each dialysis session  Acute metabolic encephalopathy Secondary to uremia sepsis physiology.  Resolved.   Acute kidney injury with ATN secondary to sepsis physiology:  Current creatinine ranged between 5 and 6 with elevated BUN. Remains anuric and seems  unlikely will regain renal recovery Due to hypotension during HD Renal has added Midodrine (6/9) Currently on TTS HD schedule while monitoring for renal recovery.  Having difficulty sitting up in chair for more than 2 hours   Atrial fibrillation with RVR Continue amiodarone for rhythm and rate control and Eliquis for stroke prevention.   Given persistent lower extremity edema and history of atrial fibrillation we will obtain 2D echocardiogram now that he is rate controlled   Asymptomatic anemia secondary to renal dysfunction and anemia of critical illness Patient with unknown baseline. Hemoglobin of 11 on admission with slow downtrend.  Continue oral iron and Aranesp  S/p 2 units PRBC across HD on 6/2.     Transfuse if hemoglobin 7 g or below.   Physical deconditioning Secondary to acute critical illness Current recommendation is for skilled nursing facility when medically stable Patient having difficulty standing secondary to bilateral knee pain as well as extensive edema.  Unable to pivot to get into chair and requires [facility] lift. Kreg tilt bed ordered to assess PT with therapy   Anasarca Secondary to fluid resuscitation with IV fluids in setting of kidney failure in addition to albuminemia.     Current I/O of +17.4L.   Volume management  w/ HD  Still substantial volume overload with pitting edema in legs.   Essential hypertension Patient was on amlodipine, losartan and metoprolol as an outpatient.  Antihypertensives initially held due to shock physiology.  Blood pressure readings remain suboptimal so home medications have not been  resumed Echo as above   Diabetes mellitus, type 2 Patient was on metformin as an outpatient.    CBGs in the past 24 hours have ranged between 153 and 222 Patient with variable oral intake and is currently on dialysis I would continue short acting coverage with SSI   Hyperlipidemia Continue Crestor   Severe malnutrition History of gastric bypass.  Worse secondary to critical illness.  Tube feedings and now is on oral feedings with documented variable intake    Dietitian recommendations: Trial Boost Plus chocolate TID- Each supplement provides 360kcal and 14g protein.   30 ml ProSource Plus BID, each supplement provides 100 kcals and 15 grams protein.  MVI with minerals BID per tube 500 mg calcium carbonate TID per tube Supplement Vitamin C 500 mg BID 6/9 Vitamin A intiated     Pressure injury Medial coccyx, not POA   Pressure Injury 11/12/20 Sacrum Medial Unstageable - Full thickness tissue loss in which the base of the injury is covered by slough (yellow, tan, gray, green or brown) and/or eschar (tan, brown or black) in the wound bed. (Active)  11/12/20 2000  Location: Sacrum  Location Orientation: Medial  Staging: Unstageable - Full thickness tissue loss in which the base of the injury is covered by slough (yellow, tan, gray, green or brown) and/or eschar (tan, brown or black) in the wound bed.  Wound Description (Comments):  Present on Admission: No      Diarrhea Likely multifactorial due to gallbladder disease and antibiotics.  Significantly improved.   Hypokalemia Resolved.   Right upper extremity pain and swelling ?  Related to asymmetric edema complicating underlying arthritis.   Venous Doppler negative for DVT or SVT.   Now resolved      Data Reviewed: Basic Metabolic Panel: Recent Labs  Lab 11/24/20 0237 11/24/20 2203 11/27/20 0659 11/29/20 0052  NA 132* 132* 131* 133*  K 3.1* 4.7 4.1 3.4*  CL 96* 100 97* 96*  CO2 23 19* 23 26  GLUCOSE 149* 270* 187* 180*  BUN 56* 41* 97* 65*  CREATININE 5.70* 4.16* 6.38* 5.30*  CALCIUM 8.1* 8.1* 8.1* 8.2*  PHOS  --  5.9* 6.3* 4.6   Liver Function Tests: Recent Labs  Lab 11/24/20 2203 11/27/20 0659 11/29/20 0052  ALBUMIN 2.0* 1.9* 2.1*   No results for input(s): LIPASE, AMYLASE in the last 168 hours. No results for input(s): AMMONIA in the last 168  hours. CBC: Recent Labs  Lab 11/24/20 0237 11/24/20 2203 11/27/20 0659 11/28/20 0222 11/29/20 0052  WBC 12.1* 14.3* 10.6* 9.2 10.6*  HGB 7.7* 7.9* 7.5* 7.4* 7.7*  HCT 24.5* 25.0* 23.9* 24.5* 24.5*  MCV 84.5 86.2 86.6 88.1 88.1  PLT 402* 434* 491* 465* 454*   Cardiac Enzymes: No results for input(s): CKTOTAL, CKMB, CKMBINDEX, TROPONINI in the last 168 hours. BNP (last 3 results) No results for input(s): BNP in the last 8760 hours.  ProBNP (last 3 results) No results for input(s): PROBNP in the last 8760 hours.  CBG: Recent Labs  Lab 11/28/20 0755 11/28/20 1203 11/28/20 1722 11/28/20 2234 11/29/20 0734  GLUCAP 153* 181* 222* 175* 177*    Recent Results (from the past 240 hour(s))  Body fluid culture w Gram Stain     Status: None   Collection Time: 11/23/20  7:31 PM   Specimen: Body Fluid  Result Value Ref Range Status   Specimen Description FLUID SYNOVIAL RIGHT KNEE  Final   Special Requests Immunocompromised  Final   Gram Stain  Final    RARE WBC PRESENT, PREDOMINANTLY MONONUCLEAR NO ORGANISMS SEEN    Culture   Final    NO GROWTH 3 DAYS Performed at [facility] [address] [place], [place] [address]    Report Status 11/27/2020 FINAL  Final  Body fluid culture w Gram Stain     Status: None   Collection Time: 11/23/20  7:32 PM   Specimen: Body Fluid  Result Value Ref Range Status   Specimen Description FLUID SYNOVIAL LEFT KNEE  Final   Special Requests Immunocompromised  Final   Gram Stain   Final    FEW WBC PRESENT,BOTH PMN AND MONONUCLEAR RARE GRAM POSITIVE COCCI    Culture   Final    NO GROWTH 3 DAYS Performed at [facility], [place] [address] [place], [place] [address]    Report Status 11/27/2020 FINAL  Final  Aerobic/Anaerobic Culture w Gram Stain (surgical/deep wound)     Status: None (Preliminary result)   Collection Time: 11/24/20 12:34 PM   Specimen: PATH Cytology Misc. fluid; Body Fluid  Result Value Ref Range Status    Specimen Description FLUID  Final   Special Requests RIGHT KNEE SYNOVIAL FLUID SPEC A  Final   Gram Stain   Final    FEW WBC PRESENT,BOTH PMN AND MONONUCLEAR NO ORGANISMS SEEN    Culture   Final    NO GROWTH 4 DAYS NO ANAEROBES ISOLATED; CULTURE IN PROGRESS FOR 5 DAYS Performed at [facility], [place] [address], [place], [place] [address]    Report Status PENDING  Incomplete  Aerobic/Anaerobic Culture w Gram Stain (surgical/deep wound)     Status: None (Preliminary result)   Collection Time: 11/24/20 12:36 PM   Specimen: Fluid  Result Value Ref Range Status   Specimen Description FLUID  Final   Special Requests LEFT KNEE SYNOVIAL SPEC B  Final   Gram Stain   Final    MODERATE WBC PRESENT,BOTH PMN AND MONONUCLEAR NO ORGANISMS SEEN    Culture   Final    NO GROWTH 4 DAYS NO ANAEROBES ISOLATED; CULTURE IN PROGRESS FOR 5 DAYS Performed at [facility], [place] [address], [place], [place] [address]    Report Status PENDING  Incomplete  Culture, blood (Routine X 2) w Reflex to ID Panel     Status: None (Preliminary result)   Collection Time: 11/24/20 10:03 PM   Specimen: BLOOD  Result Value Ref Range Status   Specimen Description BLOOD SITE NOT SPECIFIED  Final   Special Requests   Final    BOTTLES DRAWN AEROBIC ONLY Blood Culture results may not be optimal due to an inadequate volume of blood received in culture bottles   Culture   Final    NO GROWTH 4 DAYS Performed at [facility], [place] [address] [place], [place] [address]    Report Status PENDING  Incomplete  Culture, blood (Routine X 2) w Reflex to ID Panel     Status: None (Preliminary result)   Collection Time: 11/24/20 10:03 PM   Specimen: BLOOD  Result Value Ref Range Status   Specimen Description BLOOD SITE NOT SPECIFIED  Final   Special Requests   Final    BOTTLES DRAWN AEROBIC AND ANAEROBIC Blood Culture results may not be optimal due to an inadequate volume of blood received in culture bottles    Culture   Final    NO GROWTH 4 DAYS Performed at [facility], [address] [place], [place] [address]    Report Status PENDING  Incomplete     Studies: No results found.  Scheduled Meds:  (feeding supplement) PROSource Plus  30 mL Oral BID BM   amiodarone  200 mg Oral Daily   apixaban  5 mg Oral BID   vitamin C  500 mg Oral BID   calcium carbonate  1 tablet Oral TID   Chlorhexidine Gluconate Cloth  6 each Topical Q0600   cholecalciferol  1,000 Units Oral Daily   collagenase   Topical Daily   darbepoetin (ARANESP) injection - DIALYSIS  40 mcg Intravenous Q Sat-HD   docusate sodium  100 mg Oral BID   feeding supplement (NEPRO CARB STEADY)  237 mL Oral BID BM   insulin aspart  0-6 Units Subcutaneous TID WC   iron polysaccharides  150 mg Oral Daily   lactose free nutrition  237 mL Oral TID WC   lidocaine  1 patch Transdermal Q24H   midodrine  10 mg Oral Q T,Th,Sa-HD   multivitamin with minerals  1 tablet Oral BID   rosuvastatin  10 mg Oral Daily   sodium chloride flush  10-40 mL Intracatheter Q12H   sodium chloride flush  5 mL Intracatheter Q8H   zinc sulfate  220 mg Oral Daily   Continuous Infusions:  sodium chloride      Principal Problem:   Septic shock (HCC) Active Problems:   Pressure injury of skin   Protein-calorie malnutrition, severe   Encounter for central line placement   AKI (acute kidney injury) ([place])   RUQ abdominal pain   Biliary sepsis   Elevated liver function tests   Consultants: PCCM Gastroenterology General surgery Interventional radiology Nephrology Orthopedic  Procedures: Bilateral knee aspiration by orthopedics 6/3 Bilateral knee arthroscopic I&D in OR by orthopedic 6/4 Lower extremity venous duplex Upper extremity venous duplex Percutaneous cholecystostomy tube by IR  Antibiotics:    Time spent: 35 minutes    Erin Hearing ANP  [facility] 7 am - 330 pm/M-F for direct patient care and secure chat Please  refer to Amion for contact info 21  days

## 2020-11-29 NOTE — TOC Progression Note (Addendum)
Transition of Care [facility]) - Progression Note    Patient Details  Name: Tom Scott MRN: [id number] Date of Birth: 06-Dec-1955  Transition [facility] CM/SW Contact  Curlene Labrum, RN Phone Number: 11/29/2020, 11:27 AM  Clinical Narrative:    Case management met with the patient, wife, and sister at the bedside to discuss transitions of care needs.  The patient is unable to sit for HD sessions at this time due to skin breakdown wounds on his buttocks.  The patient plans to discharge to SNF facility once he is able to sit for HD sessions.  The patient is being followed by nephrology at this time to determine if patient will continue to need HD  to manage his acute kidney injury at this time.  The patient has been vaccinated for COVID.  Lissa Merlin, NP ordered a specialty bed so patient can be positioned while in the hospital room and encourage healing of buttocks wound.  11/29/2020 1200 - I called and left a message with Burgess Estelle, CM at [facility] to check for bed offer and possible placement once patient is able to sit for HD sessions.  I spoke with Lissa Merlin, NP and chair pad was ordered for the patient since the patient has not received one according to the patient's family.  I sent the bedside nurse a message to have the floor secretary order one for the patient.  CM and MSW with DTP Team will continue to follow the patient for [facility] placement.  11/29/2020 - 1445- CM spoke with Burgess Estelle, CM at [facility] and the facility is willing to offer a bed to the patient.  Perkins, CM at [facility] will speak with the patient's wife this afternoon to discuss rehabilitation at the facility and will start insurance authorization.  [facility] physician felt like the patient would be a good candidate for rehabilitation at the facility.  CM called and spoke with the patient's wife, Tom Scott and updated her that [facility] is willing to offer the patient a bed at the facility and will come and speak  with the patient and wife at the bedside prior to starting insurance authorization at the facility.    Expected Discharge Plan: [place] Barriers to Discharge: Continued Medical Work up  Expected Discharge Plan and Services Expected Discharge Plan: [place] In-house Referral: Clinical Social Work Discharge Planning Services: CM Consult Post Acute Care Choice: Long Term Acute Care (LTAC) Living arrangements for the past 2 months: Single Family Home                                       Social Determinants of Health (SDOH) Interventions    Readmission Risk Interventions No flowsheet data found.

## 2020-11-29 NOTE — Progress Notes (Signed)
Patient ID: Tom Scott, male   DOB: [birth year], 65 y.o.   MRN: [id number] S:Was not able to sit for long period of time due to pain in his sacrum. O:BP (!) 100/55 (BP Location: Left Arm)   Pulse 84   Temp 98.2 F (36.8 C) (Oral)   Resp 19   Ht '5\' 10"'$  (1.778 m)   Wt 118.5 kg   SpO2 97%   BMI 37.48 kg/m   Intake/Output Summary (Last 24 hours) at 11/29/2020 1319 Last data filed at 11/29/2020 1231 Gross per 24 hour  Intake 490 ml  Output 125 ml  Net 365 ml   Intake/Output: I/O last 3 completed shifts: In: 53 [P.O.:490] Out: 125 [Urine:125]  Intake/Output this shift:  Total I/O In: 340 [P.O.:340] Out: -  Weight change: 1.2 kg Gen: NAD CVS: RRR Resp: CTA Abd: benign Ext: 2+ edema ble  Recent Labs  Lab 11/24/20 0237 11/24/20 2203 11/27/20 0659 11/29/20 0052  NA 132* 132* 131* 133*  K 3.1* 4.7 4.1 3.4*  CL 96* 100 97* 96*  CO2 23 19* 23 26  GLUCOSE 149* 270* 187* 180*  BUN 56* 41* 97* 65*  CREATININE 5.70* 4.16* 6.38* 5.30*  ALBUMIN  --  2.0* 1.9* 2.1*  CALCIUM 8.1* 8.1* 8.1* 8.2*  PHOS  --  5.9* 6.3* 4.6   Liver Function Tests: Recent Labs  Lab 11/24/20 2203 11/27/20 0659 11/29/20 0052  ALBUMIN 2.0* 1.9* 2.1*   No results for input(s): LIPASE, AMYLASE in the last 168 hours. No results for input(s): AMMONIA in the last 168 hours. CBC: Recent Labs  Lab 11/24/20 0237 11/24/20 2203 11/27/20 0659 11/28/20 0222 11/29/20 0052  WBC 12.1* 14.3* 10.6* 9.2 10.6*  HGB 7.7* 7.9* 7.5* 7.4* 7.7*  HCT 24.5* 25.0* 23.9* 24.5* 24.5*  MCV 84.5 86.2 86.6 88.1 88.1  PLT 402* 434* 491* 465* 454*   Cardiac Enzymes: No results for input(s): CKTOTAL, CKMB, CKMBINDEX, TROPONINI in the last 168 hours. CBG: Recent Labs  Lab 11/28/20 1203 11/28/20 1722 11/28/20 2234 11/29/20 0734 11/29/20 1208  GLUCAP 181* 222* 175* 177* 170*    Iron Studies: No results for input(s): IRON, TIBC, TRANSFERRIN, FERRITIN in the last 72 hours. Studies/Results: No results found.   (feeding supplement) PROSource Plus  30 mL Oral BID BM   amiodarone  200 mg Oral Daily   apixaban  5 mg Oral BID   vitamin C  500 mg Oral BID   calcium carbonate  1 tablet Oral TID   Chlorhexidine Gluconate Cloth  6 each Topical Q0600   cholecalciferol  1,000 Units Oral Daily   collagenase   Topical Daily   darbepoetin (ARANESP) injection - DIALYSIS  40 mcg Intravenous Q Sat-HD   docusate sodium  100 mg Oral BID   feeding supplement (NEPRO CARB STEADY)  237 mL Oral BID BM   insulin aspart  0-6 Units Subcutaneous TID WC   iron polysaccharides  150 mg Oral Daily   lactose free nutrition  237 mL Oral TID WC   lidocaine  1 patch Transdermal Q24H   midodrine  10 mg Oral Q T,Th,Sa-HD   multivitamin with minerals  1 tablet Oral BID   rosuvastatin  10 mg Oral Daily   sodium chloride flush  10-40 mL Intracatheter Q12H   sodium chloride flush  5 mL Intracatheter Q8H    BMET    Component Value Date/Time   NA 133 (L) 11/29/2020 0052   K 3.4 (L) 11/29/2020 0052   CL 96 (  L) 11/29/2020 0052   CO2 26 11/29/2020 0052   GLUCOSE 180 (H) 11/29/2020 0052   BUN 65 (H) 11/29/2020 0052   CREATININE 5.30 (H) 11/29/2020 0052   CALCIUM 8.2 (L) 11/29/2020 0052   GFRNONAA 11 (L) 11/29/2020 0052   CBC    Component Value Date/Time   WBC 10.6 (H) 11/29/2020 0052   RBC 2.78 (L) 11/29/2020 0052   HGB 7.7 (L) 11/29/2020 0052   HCT 24.5 (L) 11/29/2020 0052   PLT 454 (H) 11/29/2020 0052   MCV 88.1 11/29/2020 0052   MCH 27.7 11/29/2020 0052   MCHC 31.4 11/29/2020 0052   RDW 19.0 (H) 11/29/2020 0052   Assessment/Plan:   Acute kidney Injury: Secondary to septic shock/ATN and likely exacerbated by hemodynamic insult from atrial fibrillation with RVR.  No known baseline Scr but was 1.99 at time of presentation.  Started on hemodialysis on 5/23 and at this time he remains anuric and without any evidence of renal recovery.  Continue hemodialysis on a TTS schedule while monitoring for renal recovery.   Unfortunately no significant increase in UOP Tunneled HD catheter placed 11/19/20 Hypotensive on HD so will add midodrine 10 mg before HD moving forward.  Calculus cholecystitis with E. coli bacteremia: Status post percutaneous cholecystostomy on 5/20 with suspected amiodarone associated elevation of LFTs. Atrial fibrillation with rapid ventricular response: Rate control fair on amiodarone, likely compounded by infection from septic arthritis. Microcytic anemia: On oral iron and status post Aranesp-likely compounded by acute/critical illness.  PRBC transfusion when hemoglobin <7.0. Physical deconditioning: Secondary to acute illness and now limited by bilateral knee pain/swelling from septic arthritis. Septic arthritis: Seen by orthopedic surgery for concerns of knee swelling 2 days ago with aspiration revealing cloudy aspirate in both knees with few GPC from the left knee; consequently underwent arthroscopic debridement of both knees yesterday.  Surgical cultures pending while on intravenous vancomycin. Moderate protein malnutrition:  Presumably due to sepsis. Sacral decubitus ulcer: per notes unstageable pressure injury to inner gluteal cleft/sacrum 4/1 cm.  Topical treatment orders per wound care consult.  This is preventing him from sitting for prolonged periods of time.  Will need gelfoam seat cushion to improve his tolerance of sitting in a chair.   Deconditioning - pt is too weak for outpatient HD and will benefit from LTC placement with daily PT/OT.    Donetta Potts, MD [employer] [phone]

## 2020-11-29 NOTE — Progress Notes (Signed)
PATIENT ID: Tom Scott  MRN: [id number]  DOB/AGE:  Nov 21, 1955 / 65 y.o.  5 Days Post-Op Procedure(s) (LRB): ARTHROSCOPIC  DEBRIDMENT KNEE (Bilateral)  Subjective: Patient doing much better in regards to knee pain. Pain is mild and improving. Continues to have medical issues keeping him inpatient. In hemodialysis during rounds.    Objective: Vital signs in last 24 hours: Temp:  [98.2 F (36.8 C)-99.7 F (37.6 C)] 99.7 F (37.6 C) (06/09 1400) Pulse Rate:  [64-95] 82 (06/09 1415) Resp:  [17-21] 20 (06/09 1415) BP: (100-126)/(55-77) 107/64 (06/09 1415) SpO2:  [93 %-97 %] 93 % (06/09 1400) Weight:  [118.2 kg-118.5 kg] 118.2 kg (06/09 1400)  Intake/Output from previous day: 06/08 0701 - 06/09 0700 In: 490 [P.O.:490] Out: 125 [Urine:125] Intake/Output this shift: Total I/O In: 340 [P.O.:340] Out: -   Recent Labs    11/27/20 0659 11/28/20 0222 11/29/20 0052  HGB 7.5* 7.4* 7.7*   Recent Labs    11/28/20 0222 11/29/20 0052  WBC 9.2 10.6*  RBC 2.78* 2.78*  HCT 24.5* 24.5*  PLT 465* 454*   Recent Labs    11/27/20 0659 11/29/20 0052  NA 131* 133*  K 4.1 3.4*  CL 97* 96*  CO2 23 26  BUN 97* 65*  CREATININE 6.38* 5.30*  GLUCOSE 187* 180*  CALCIUM 8.1* 8.2*     Physical Exam: Intact pulses distally Dorsiflexion/Plantar flexion intact Compartment soft  Sutures removed from knees, no bleeding No erythema and warmth over the knees, minimal swelling Incisions healing well  Assessment/Plan: 5 Days Post-Op Procedure(s) (LRB): ARTHROSCOPIC  DEBRIDMENT KNEE (Bilateral)   Up with therapy as tolerated Weight Bearing as Tolerated (WBAT)  VTE prophylaxis:  Eliquis  Patient is much improved in regards to his knees. No signs of infection at this time. Pain and swelling likely secondary to pseudogout. Sutures removed today, bandaids put in place. Ortho signing off. No need for follow up in office unless patient has return of symptoms.   Marqueta Pulley L. [place] 11/29/2020,  2:32 PM

## 2020-11-29 NOTE — Progress Notes (Addendum)
Nutrition Follow-up  DOCUMENTATION CODES:  Severe malnutrition in context of acute illness/injury  INTERVENTION:  Recommend liberalizing diet to regular.  Continue ProSource Plus BID.  Continue Boost Plus TID.  Continue Nepro shakes BID.  Continue vitamin supplements for repletion.  Recommend Cortrak replacement with EN given malnutrition status and poor PO. Patient declines.   NUTRITION DIAGNOSIS:  Severe Malnutrition related to acute illness (cholecystitis) as evidenced by mild fat depletion, moderate muscle depletion, energy intake < or equal to 50% for > or equal to 5 days. - ongoing  GOAL:  Patient will meet greater than or equal to 90% of their needs - not meeting  MONITOR:  Diet advancement, Labs, Weight trends, TF tolerance, Skin, I & O's  REASON FOR ASSESSMENT:  NPO/Clear Liquid Diet    ASSESSMENT:  65 year old male who presented on 5/19 from [facility] with septic shock, cholecystitis. 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 Pt admitted with acute calculous cholecystitis, AKI. 5/20 - NG tube placed, percutaneous cholecystostomy tube placement 5/21 - NG tube removed, clear liquids 5/23 - first HD, NPO 5/25 - Cortrak placed (in jejunum), diet advanced to Regular 5/28 - Cortrak pulled  Spoke with family while pt in HD. Wife interested in starting TF if needed. Pt is eating half of his meals and drinking at least half of all supplements. He is sick of the hospital food and is distracted with other providers coming in when drinking his supplements per wife. Wife asked if she could bring outside food in and RD said that this is appropriate to get him to eat more PO.  Per Epic, pt eating 0-50%, 50% the last four documented meals.  Admit wt: 98 kg Current wt: 118.5 kg  Vitamin/Mineral Profile: Thiamine B1: 116.7 Vitamin B12: 2699 (H) Vitamin A: 6.1 (L) Vitamin D: 17.47 (L) Vitamin C: <0.1 (L) Zinc: 37  (L)  Secure-chatted MD regarding low Vitamin A level and how to supplement. It is recommend to start with 100,000 units (30,000 mcg)/day for 3 days, followed by 50,000 units (15,000 mcg)/day for 2 weeks.  Also secure-chatted MD about liberalizing diet given that electrolytes are low/normal and PO intake remains inadequate with refusal of Cortrak placement.  Supplements: ProSource Plus BID, Nepro BID, Boost Plus TID  Medications: reviewed; Vitamin C BID, TUMS TID, Vitamin D3, colace BID, SSI, MVI with minerals  Labs: reviewed; Na 133, K 3.4, CBG 170-222 HbA1c: 8.3% (10/2020)  Diet Order:   Diet Order             Diet renal with fluid restriction Fluid restriction: 1200 mL Fluid; Room service appropriate? Yes; Fluid consistency: Thin  Diet effective now                  EDUCATION NEEDS:  No education needs have been identified at this time  Skin:  Skin Assessment: Skin Integrity Issues: Skin Integrity Issues:: Stage II Stage II: coccyx  Last BM:  11/28/20 - Type 7, medium  Height:  Ht Readings from Last 1 Encounters:  11/24/20 '5\' 10"'$  (1.778 m)   Weight:  Wt Readings from Last 1 Encounters:  11/29/20 118.2 kg   BMI:  Body mass index is 37.39 kg/m.  Estimated Nutritional Needs:  Kcal:  [id number] Protein:  125-145 grams Fluid:  >/= 2.0 L  Derrel Nip, RD, LDN Registered Dietitian I After-Hours/Weekend Pager # in [place]

## 2020-11-30 ENCOUNTER — Inpatient Hospital Stay (HOSPITAL_COMMUNITY): Payer: Medicare (Managed Care)

## 2020-11-30 DIAGNOSIS — I4891 Unspecified atrial fibrillation: Secondary | ICD-10-CM

## 2020-11-30 LAB — CULTURE, BLOOD (ROUTINE X 2)
Culture: NO GROWTH
Culture: NO GROWTH

## 2020-11-30 LAB — GLUCOSE, CAPILLARY
Glucose-Capillary: 170 mg/dL — ABNORMAL HIGH (ref 70–99)
Glucose-Capillary: 157 mg/dL — ABNORMAL HIGH (ref 70–99)
Glucose-Capillary: 161 mg/dL — ABNORMAL HIGH (ref 70–99)
Glucose-Capillary: 158 mg/dL — ABNORMAL HIGH (ref 70–99)

## 2020-11-30 LAB — ECHOCARDIOGRAM COMPLETE
Area-P 1/2: 2.85 cm2
Height: 70 in
S' Lateral: 2.5 cm
Weight: 4236.36 oz

## 2020-11-30 MED ORDER — PERFLUTREN LIPID MICROSPHERE
1.0000 mL | INTRAVENOUS | Status: AC | PRN
  Administered 2020-11-30: 2 mL via INTRAVENOUS
  Filled 2020-11-30: qty 10

## 2020-11-30 NOTE — Progress Notes (Signed)
Echocardiogram 2D Echocardiogram has been performed.  [name] Tom Scott 11/30/2020, 1:23 PM

## 2020-11-30 NOTE — Progress Notes (Signed)
Physical Therapy Treatment Patient Details Name: Tom Scott MRN: [id number] DOB: 04-21-1956 Today's Date: 11/30/2020    History of Present Illness Tom Scott is a 65 y.o. M presented to [facility] with c/o chest pain and epigastric pain with concern for choledocholithiasis.  He was transferred to [facility] on 11/08/20 for ERCP and due to septic shock requiring vasopressor support.  He was admitted to the ICU, s/p perc cholecystomy tube placement on 5/20 with Dr. Annamaria Boots. Hospital course complicated by AKI, s/p tunneled HD catheter placement with Dr. Serafina Royals on 5/30, evidence of ESBL E. coli bacteremia during hospitalization. S/p bil arthroscopic debridment septic knees 6/4. PMH: Afib, HTN, DMII, HLD.    PT Comments    On arrival patient supine in kreg tilt bed.  He was unable to perform tilt therapies and parts ( straps ) were missing to perform this action.  He was moved from supine to sit and able to tolerate sitting edge of bed x 2-3 min before dizziness worsened and BP continued to drop.  Unable to attempt standing.    Orthostatic vitals: Supine:132/73 Sitting:113/63 Sitting after 3 min:101/61 Supine:127/72   Follow Up Recommendations  SNF;LTACH     Equipment Recommendations  Wheelchair cushion (measurements PT);Wheelchair (measurements PT);Hospital bed;3in1 (PT)    Recommendations for Other Services       Precautions / Restrictions Precautions Precautions: Fall;Other (comment) Precaution Comments: non-tunneled HD catheter left neck Restrictions Weight Bearing Restrictions: No    Mobility  Bed Mobility Overal bed mobility: Needs Assistance Bed Mobility: Supine to Sit;Sit to Supine     Supine to sit: Max assist;+2 for physical assistance Sit to supine: Total assist;+2 for physical assistance   General bed mobility comments: Pt required assistance to move B LEs to edge of bed and elevate trunk into a seated position.  Pt with increased pain when B knees  flexed at edge of bed.  He was able to move into sitting and maintain position unsupported.  Pt lasted 2-3 min as BP continued to drop in sitting and symptoms worsened.    Transfers                 General transfer comment: Did not attempt as BP continued to drop in sitting.  Ambulation/Gait                 Stairs             Wheelchair Mobility    Modified Rankin (Stroke Patients Only)       Balance     Sitting balance-Leahy Scale: Fair Sitting balance - Comments: Fair once he was balance at edge of bed,  Initially poor with posterior lean.                                    Cognition Arousal/Alertness: Awake/alert Behavior During Therapy: Flat affect Overall Cognitive Status: Impaired/Different from baseline Area of Impairment: Attention;Safety/judgement;Awareness;Problem solving                         Safety/Judgement: Decreased awareness of deficits;Decreased awareness of safety   Problem Solving: Decreased initiation;Difficulty sequencing;Requires verbal cues;Requires tactile cues General Comments: Pt needing increased time to process cues, sequence, and initiate tasks. Pt with poor insight into his deficits and poor ability to follow multi-step cues.      Exercises      General Comments  Pertinent Vitals/Pain Pain Assessment: Faces Pain Score: 6  Pain Location: bil knees, R >L Pain Descriptors / Indicators: Discomfort;Tender;Grimacing;Guarding Pain Intervention(s): Monitored during session;Repositioned    Home Living                      Prior Function            PT Goals (current goals can now be found in the care plan section) Acute Rehab PT Goals Patient Stated Goal: to get to the chair by standing Potential to Achieve Goals: Good Progress towards PT goals: Progressing toward goals    Frequency           PT Plan Current plan remains appropriate    Co-evaluation               AM-PAC PT "6 Clicks" Mobility   Outcome Measure  Help needed turning from your back to your side while in a flat bed without using bedrails?: A Lot Help needed moving from lying on your back to sitting on the side of a flat bed without using bedrails?: A Lot Help needed moving to and from a bed to a chair (including a wheelchair)?: Total Help needed standing up from a chair using your arms (e.g., wheelchair or bedside chair)?: Total Help needed to walk in hospital room?: Total Help needed climbing 3-5 steps with a railing? : Total 6 Click Score: 8    End of Session   Activity Tolerance: Patient limited by pain;Patient limited by fatigue (limited due to dizziness and + orthostasis.) Patient left: with call bell/phone within reach;with family/visitor present;in chair Nurse Communication: Mobility status PT Visit Diagnosis: Other abnormalities of gait and mobility (R26.89);Muscle weakness (generalized) (M62.81);Pain;Difficulty in walking, not elsewhere classified (R26.2)     Time: [id number] PT Time Calculation (min) (ACUTE ONLY): 30 min  Charges:  $Therapeutic Activity: 23-37 mins                     [name] , PTA Acute Rehabilitation Services Pager [phone] Office [phone]    Agastya Meister Eli Hose 11/30/2020, 3:44 PM

## 2020-11-30 NOTE — TOC Progression Note (Signed)
Transition of Care [facility]) - Progression Note    Patient Details  Name: Tom Scott MRN: [id number] Date of Birth: [birth year]  Transition of Care [facility]) CM/SW [place], RN Phone Number: 11/30/2020, 8:06 AM  Clinical Narrative:    Case management spoke with Burgess Estelle, CM with [facility] and [facility] has offered an available bed to the patient.  I've requested updated clinicals from PT/OT and Lissa Merlin, NP to send to [employer] so the facility can pursue insurance authorization for placement.  CM and MSW will continue to follow the patient for rehabilitation placement.   Expected Discharge Plan: Long Term Acute Care (LTAC) Barriers to Discharge: Continued Medical Work up  Expected Discharge Plan and Services Expected Discharge Plan: Long Term Acute Care (LTAC) In-house Referral: Clinical Social Work Discharge Planning Services: CM Consult Post Acute Care Choice: Long Term Acute Care (LTAC) Living arrangements for the past 2 months: Single Family Home                                       Social Determinants of Health (SDOH) Interventions    Readmission Risk Interventions No flowsheet data found.

## 2020-11-30 NOTE — Progress Notes (Signed)
PT Cancellation Note  Patient Details Name: Tom Scott MRN: [id number] DOB: May 28, 1956   Cancelled Treatment:    Reason Eval/Treat Not Completed: (P) Patient at procedure or test/unavailable;Other (comment) (off unit for echo, will f/u per POC.)   Sigourney Portillo Eli Hose 11/30/2020, 1:56 PM  [name] , PTA Acute Rehabilitation Services Pager [phone] Office [phone]

## 2020-11-30 NOTE — Plan of Care (Signed)

## 2020-11-30 NOTE — Progress Notes (Signed)
Unable to tilt patient as bed is lacking support straps to use this function.  Called Kreg bed rep Merrilee Seashore [phone] to trouble shoot getting the straps to the patient so that nursing can tilt the patient over the weekend.    Jarvis Morgan Acute Rehabilitation Services Pager [phone] Office [phone]

## 2020-11-30 NOTE — Progress Notes (Signed)
[facility] PROGRESS NOTE  Tom Scott [id number] DOB: 1955-08-01 DOA: 11/08/2020 PCP: Patient, No Pcp Per (Inactive)  Status: Remains inpatient appropriate because:Persistent severe electrolyte disturbances, Unsafe d/c plan, Inpatient level of care appropriate due to severity of illness, and still requiring HD  which is a new problem  Dispo: The patient is from: Home              Anticipated d/c is to: LTAC              Patient currently is not medically stable to d/c.   Difficult to place patient Yes  Level of care: Telemetry Medical  Code Status: Full Family Communication: 6/10 family at bedside DVT prophylaxis: Eliquis COVID vaccination status: Has received both Moderna vaccines as well as 1 booster    HPI: 66 y.o. male with a history of atrial fibrillation, hypertension, diabetes mellitus, hyperlipidemia initially presented to [facility] secondary to chest pain and epigastric pain.  He developed septic shock requiring pressors.  Reported to have cholecystitis with choledocholithiasis and possible need for ERCP.  Also developed A. fib with RVR.  He was transferred to [facility] for ERCP and due to septic shock requiring vasopressor support.  He was admitted to the ICU.  General surgery and gastroenterology were consulted for management of his acute calculus cholecystitis.  IR consulted for percutaneous cholecystostomy and and he was managed with empiric antibiotics.    Hospital course has been complicated by AKI/ATN requiring initiation of hemodialysis and no signs of renal recovery.  He was found to have evidence of ESBL E. coli bacteremia from outside hospital report and has completed 14-day course of meropenem, percutaneous cholecystostomy now removed and needs to follow-up with general surgery in 3 to 4 weeks for laparoscopic cholecystectomy consideration.  Hospital course further complicated by suspected septic arthritis bilateral knees based on a  positive Gram stain, briefly on empiric IV vancomycin, s/p bilateral knee washout by orthopedics, cultures however negative, septic arthritis ruled out and all antibiotics discontinued.  Continues to have significant lower extremity swelling and knee pain that has prohibited mobilization and then of recent has required a Hoyer lift to get out of bed.  Tolerates less than 2 hours of out of bed to chair.  Subjective: Alert.  Reporting improvement in appetite.  States has been using sheets applied to plantar surfaces of feet to assist with physical therapy exercises  Objective: Vitals:   11/29/20 2018 11/30/20 0437  BP: 126/71 131/67  Pulse:  90  Resp: 18 18  Temp: 98.6 F (37 C) 98.2 F (36.8 C)  SpO2: 94% 99%    Intake/Output Summary (Last 24 hours) at 11/30/2020 0813 Last data filed at 11/30/2020 0800 Gross per 24 hour  Intake 460 ml  Output 3000 ml  Net -2540 ml   Filed Weights   11/29/20 1400 11/29/20 1742 11/30/20 0437  Weight: 118.2 kg 115 kg 120.1 kg    Exam:  Constitutional: Calm, appears to be comfortable Respiratory: Lungs are clear to auscultation bilaterally, stable on room air durations 99% Cardiovascular: Irregular rate, in atrial fibrillation with controlled ventricular response, nephric and improvement in pitting edema bilateral lower extremities after hemodialysis; heart sounds are normal Abdomen: LBM 6/8, eating about 50% of meals now.  Abdomen soft nontender nondistended with normoactive bowel sounds Musculoskeletal: Currently lower extremities without Ace wraps and. Neurologic: CN 2-12 grossly intact. Sensation intact, DTR normal. Strength 5/5 extremities and 3/5 on the lower extremities  Psychiatric: Alert and oriented  x3.  Pleasant affect.   Assessment/Plan: Acute problems:  Septic shock, POA/ ESBL E. coli bacteremia:  Sepsis physiology has resolved Secondary to cholecystitis and ESBL E. coli bacteremia.  Patient required ICU admission with vasopressor  support.   Initial positive cultures were received from outside hospital confirming ESBL E. coli bacteremia Follow-up blood cultures on 5/29 without growth   Acute calculus cholecystitis:  Initially required percutaneous cholecystostomy tube to temporize until patient stabilized and signs of acute cholecystitis resolved.  The drain actually became dislodged and since patient was stable was not replaced Patient will need eventual laparoscopic cholecystectomy once medically stable.  This can be accomplished in the outpatient setting per general surgery Rising LFTs on 5/29.  GI consulted, etiology secondary to concomitant administration of amiodarone in the context of acute cholecystitis.  LFTs have since normalized Secondary bacteremia from acute cholecystitis treated with 14 days of meropenem per recommendations of ID and ID has subsequently signed off.   Acute on chronic bilateral knee arthritis/significant bilateral lower extremity edema Initially suspected septic arthritis based on a positive Gram stain in left knee S/p bilateral knee washout by orthopedics-cultures negative, septic arthritis ruled out. Antibiotics discontinued by ID   Orthopedic team documented pain and swelling of knee secondary to pseudogout Sutures removed on 6/9 and orthopedic team has signed off Begin Ace wraps from feet to above knees to help with mobilizing edema LE edema somewhat improved after each dialysis session  Acute metabolic encephalopathy Secondary to uremia sepsis physiology.  Resolved.   Acute kidney injury with ATN secondary to sepsis physiology:  Current creatinine ranged between 5 and 6 with elevated BUN. Remains anuric and seems unlikely will recover baseline renal function Due to hypotension during HD Renal has added Midodrine (6/9) Currently on TTS HD schedule while monitoring for renal recovery.  Having difficulty sitting up in chair for more than 2 hours   Atrial fibrillation with  RVR Continue amiodarone for rhythm and rate control and Eliquis for stroke prevention.   Given persistent lower extremity edema and history of atrial fibrillation we will obtain 2D echocardiogram now that he is rate controlled   Asymptomatic anemia secondary to renal dysfunction and anemia of critical illness Patient with unknown baseline. Hemoglobin of 11 on admission with slow downtrend.  Continue oral iron and Aranesp  S/p 2 units PRBC across HD on 6/2.     Transfuse if hemoglobin 7 g or below.   Physical deconditioning Secondary to acute critical illness Current recommendation is for skilled nursing facility when medically stable Patient having difficulty standing secondary to bilateral knee pain as well as extensive edema.  Unable to pivot to get into chair and requires [facility] lift. Kreg tilt bed ordered to assess PT with therapy Note patient is being treated for hypertension that occurs with dialysis and midodrine has been initiated.  Suspect patient may also be experiencing undetected orthostatic hypotension if ordered-based good upright.  PT aware and plans to monitor closely and utilize tilt bed for position changes. Have also made PT aware that patient needs Thera-Band's to assist with ongoing therapy exercises when PT not actively seen.   Anasarca Secondary to fluid resuscitation with IV fluids in setting of kidney failure in addition to albuminemia.     Current I/O of +17.4L.  On 6/9 3000 cc removed during dialysis Volume management  w/ HD  Still substantial volume overload with pitting edema in legs.   Essential hypertension Patient was on amlodipine, losartan and metoprolol as an outpatient.  Antihypertensives initially held due to shock physiology.  Blood pressure readings remain suboptimal so home medications have not been resumed Echo as above   Diabetes mellitus, type 2 Patient was on metformin as an outpatient.    CBGs in the past 24 hours have ranged between 153 and  222 Patient with variable oral intake and is currently on dialysis Continue short acting coverage with SSI   Hyperlipidemia Continue Crestor   Severe malnutrition History of gastric bypass. Worse secondary to critical illness.  Tube feedings have been discontinued and now is on oral feedings with documented variable intake    Dietitian recommendations: Trial Boost Plus chocolate TID- Each supplement provides 360kcal and 14g protein.   30 ml ProSource Plus BID, each supplement provides 100 kcals and 15 grams protein.  MVI with minerals BID per tube 500 mg calcium carbonate TID per tube Supplement Vitamin C 500 mg BID 6/9 Vitamin A intiated     Pressure injury Medial coccyx, not POA   Pressure Injury 11/12/20 Sacrum Medial Unstageable - Full thickness tissue loss in which the base of the injury is covered by slough (yellow, tan, gray, green or brown) and/or eschar (tan, brown or black) in the wound bed. (Active)  11/12/20 2000  Location: Sacrum  Location Orientation: Medial  Staging: Unstageable - Full thickness tissue loss in which the base of the injury is covered by slough (yellow, tan, gray, green or brown) and/or eschar (tan, brown or black) in the wound bed.  Wound Description (Comments):  Present on Admission: No      Diarrhea Likely multifactorial due to gallbladder disease and antibiotics.  Significantly improved.   Hypokalemia Resolved.   Right upper extremity pain and swelling ?  Related to asymmetric edema complicating underlying arthritis.   Venous Doppler negative for DVT or SVT.   Now resolved      Data Reviewed: Basic Metabolic Panel: Recent Labs  Lab 11/24/20 0237 11/24/20 2203 11/27/20 0659 11/29/20 0052  NA 132* 132* 131* 133*  K 3.1* 4.7 4.1 3.4*  CL 96* 100 97* 96*  CO2 23 19* 23 26  GLUCOSE 149* 270* 187* 180*  BUN 56* 41* 97* 65*  CREATININE 5.70* 4.16* 6.38* 5.30*  CALCIUM 8.1* 8.1* 8.1* 8.2*  PHOS  --  5.9* 6.3* 4.6   Liver  Function Tests: Recent Labs  Lab 11/24/20 2203 11/27/20 0659 11/29/20 0052  ALBUMIN 2.0* 1.9* 2.1*   No results for input(s): LIPASE, AMYLASE in the last 168 hours. No results for input(s): AMMONIA in the last 168 hours. CBC: Recent Labs  Lab 11/24/20 0237 11/24/20 2203 11/27/20 0659 11/28/20 0222 11/29/20 0052  WBC 12.1* 14.3* 10.6* 9.2 10.6*  HGB 7.7* 7.9* 7.5* 7.4* 7.7*  HCT 24.5* 25.0* 23.9* 24.5* 24.5*  MCV 84.5 86.2 86.6 88.1 88.1  PLT 402* 434* 491* 465* 454*   Cardiac Enzymes: No results for input(s): CKTOTAL, CKMB, CKMBINDEX, TROPONINI in the last 168 hours. BNP (last 3 results) No results for input(s): BNP in the last 8760 hours.  ProBNP (last 3 results) No results for input(s): PROBNP in the last 8760 hours.  CBG: Recent Labs  Lab 11/29/20 0734 11/29/20 1208 11/29/20 1859 11/29/20 2113 11/30/20 0759  GLUCAP 177* 170* 130* 180* 170*    Recent Results (from the past 240 hour(s))  Body fluid culture w Gram Stain     Status: None   Collection Time: 11/23/20  7:31 PM   Specimen: Body Fluid  Result Value Ref Range Status  Specimen Description FLUID SYNOVIAL RIGHT KNEE  Final   Special Requests Immunocompromised  Final   Gram Stain   Final    RARE WBC PRESENT, PREDOMINANTLY MONONUCLEAR NO ORGANISMS SEEN    Culture   Final    NO GROWTH 3 DAYS Performed at [facility], [place] [address], [place], [place] [address]    Report Status 11/27/2020 FINAL  Final  Body fluid culture w Gram Stain     Status: None   Collection Time: 11/23/20  7:32 PM   Specimen: Body Fluid  Result Value Ref Range Status   Specimen Description FLUID SYNOVIAL LEFT KNEE  Final   Special Requests Immunocompromised  Final   Gram Stain   Final    FEW WBC PRESENT,BOTH PMN AND MONONUCLEAR RARE GRAM POSITIVE COCCI    Culture   Final    NO GROWTH 3 DAYS Performed at [facility], [place] [address], [place], [place] [address]    Report Status 11/27/2020 FINAL  Final   Aerobic/Anaerobic Culture w Gram Stain (surgical/deep wound)     Status: None   Collection Time: 11/24/20 12:34 PM   Specimen: PATH Cytology Misc. fluid; Body Fluid  Result Value Ref Range Status   Specimen Description FLUID  Final   Special Requests RIGHT KNEE SYNOVIAL FLUID SPEC A  Final   Gram Stain   Final    FEW WBC PRESENT,BOTH PMN AND MONONUCLEAR NO ORGANISMS SEEN    Culture   Final    No growth aerobically or anaerobically. Performed at [facility], [place] [address], [place], [place] [address]    Report Status 11/29/2020 FINAL  Final  Aerobic/Anaerobic Culture w Gram Stain (surgical/deep wound)     Status: None   Collection Time: 11/24/20 12:36 PM   Specimen: Fluid  Result Value Ref Range Status   Specimen Description FLUID  Final   Special Requests LEFT KNEE SYNOVIAL SPEC B  Final   Gram Stain   Final    MODERATE WBC PRESENT,BOTH PMN AND MONONUCLEAR NO ORGANISMS SEEN    Culture   Final    No growth aerobically or anaerobically. Performed at [facility], [place] [address], [place], [place] [address]    Report Status 11/29/2020 FINAL  Final  Culture, blood (Routine X 2) w Reflex to ID Panel     Status: None (Preliminary result)   Collection Time: 11/24/20 10:03 PM   Specimen: BLOOD  Result Value Ref Range Status   Specimen Description BLOOD SITE NOT SPECIFIED  Final   Special Requests   Final    BOTTLES DRAWN AEROBIC ONLY Blood Culture results may not be optimal due to an inadequate volume of blood received in culture bottles   Culture   Final    NO GROWTH 4 DAYS Performed at [facility], [place] [address], [place], [place] [address]    Report Status PENDING  Incomplete  Culture, blood (Routine X 2) w Reflex to ID Panel     Status: None (Preliminary result)   Collection Time: 11/24/20 10:03 PM   Specimen: BLOOD  Result Value Ref Range Status   Specimen Description BLOOD SITE NOT SPECIFIED  Final   Special Requests   Final    BOTTLES DRAWN AEROBIC  AND ANAEROBIC Blood Culture results may not be optimal due to an inadequate volume of blood received in culture bottles   Culture   Final    NO GROWTH 4 DAYS Performed at [facility], [place] [address] [place], [place] [address]    Report Status PENDING  Incomplete     Studies: No results found.  Scheduled Meds:  (feeding supplement) PROSource Plus  30 mL Oral BID BM   amiodarone  200 mg Oral Daily   apixaban  5 mg Oral BID   vitamin C  500 mg Oral BID   calcium carbonate  1 tablet Oral TID   Chlorhexidine Gluconate Cloth  6 each Topical Q0600   cholecalciferol  1,000 Units Oral Daily   collagenase   Topical Daily   darbepoetin (ARANESP) injection - DIALYSIS  40 mcg Intravenous Q Sat-HD   docusate sodium  100 mg Oral BID   feeding supplement (NEPRO CARB STEADY)  237 mL Oral BID BM   insulin aspart  0-6 Units Subcutaneous TID WC   iron polysaccharides  150 mg Oral Daily   lactose free nutrition  237 mL Oral TID WC   lidocaine  1 patch Transdermal Q24H   midodrine  10 mg Oral Q T,Th,Sa-HD   multivitamin with minerals  1 tablet Oral BID   rosuvastatin  10 mg Oral Daily   sodium chloride flush  10-40 mL Intracatheter Q12H   sodium chloride flush  5 mL Intracatheter Q8H   vitamin A  100,000 Units Oral Daily   Followed by   Derrill Memo ON 12/02/2020] vitamin A  50,000 Units Oral Daily   Continuous Infusions:  sodium chloride      Principal Problem:   Septic shock (HCC) Active Problems:   Pressure injury of skin   Protein-calorie malnutrition, severe   Encounter for central line placement   AKI (acute kidney injury) ([place])   RUQ abdominal pain   Biliary sepsis   Elevated liver function tests   Consultants: PCCM Gastroenterology General surgery Interventional radiology Nephrology Orthopedic  Procedures: Bilateral knee aspiration by orthopedics 6/3 Bilateral knee arthroscopic I&D in OR by orthopedic 6/4 Lower extremity venous duplex Upper extremity venous  duplex Percutaneous cholecystostomy tube by IR  Antibiotics:    Time spent: 35 minutes    Erin Hearing ANP  [facility] 7 am - 330 pm/M-F for direct patient care and secure chat Please refer to Amion for contact info 22  days

## 2020-11-30 NOTE — Progress Notes (Signed)
Patient ID: Tom Scott, male   DOB: December 15, 1955, 65 y.o.   MRN: [id number] S: No new complaints.  Tolerated 3 liters UF with HD yesterday. O:BP 131/67 (BP Location: Left Arm)   Pulse 90   Temp 98.2 F (36.8 C) (Oral)   Resp 18   Ht '5\' 10"'$  (1.778 m)   Wt 120.1 kg   SpO2 99%   BMI 37.99 kg/m   Intake/Output Summary (Last 24 hours) at 11/30/2020 1355 Last data filed at 11/30/2020 1215 Gross per 24 hour  Intake 340 ml  Output 3000 ml  Net -2660 ml   Intake/Output: I/O last 3 completed shifts: In: 340 [P.O.:340] Out: 3000 [Other:3000]  Intake/Output this shift:  Total I/O In: 340 [P.O.:340] Out: -  Weight change: -0.3 kg Gen: NAD CVS: RRR Resp: CTA Abd: obese, +Bs, soft, NT Ext: 2+ edema b/l lower ext  Recent Labs  Lab 11/24/20 0237 11/24/20 2203 11/27/20 0659 11/29/20 0052  NA 132* 132* 131* 133*  K 3.1* 4.7 4.1 3.4*  CL 96* 100 97* 96*  CO2 23 19* 23 26  GLUCOSE 149* 270* 187* 180*  BUN 56* 41* 97* 65*  CREATININE 5.70* 4.16* 6.38* 5.30*  ALBUMIN  --  2.0* 1.9* 2.1*  CALCIUM 8.1* 8.1* 8.1* 8.2*  PHOS  --  5.9* 6.3* 4.6   Liver Function Tests: Recent Labs  Lab 11/24/20 2203 11/27/20 0659 11/29/20 0052  ALBUMIN 2.0* 1.9* 2.1*   No results for input(s): LIPASE, AMYLASE in the last 168 hours. No results for input(s): AMMONIA in the last 168 hours. CBC: Recent Labs  Lab 11/24/20 0237 11/24/20 2203 11/27/20 0659 11/28/20 0222 11/29/20 0052  WBC 12.1* 14.3* 10.6* 9.2 10.6*  HGB 7.7* 7.9* 7.5* 7.4* 7.7*  HCT 24.5* 25.0* 23.9* 24.5* 24.5*  MCV 84.5 86.2 86.6 88.1 88.1  PLT 402* 434* 491* 465* 454*   Cardiac Enzymes: No results for input(s): CKTOTAL, CKMB, CKMBINDEX, TROPONINI in the last 168 hours. CBG: Recent Labs  Lab 11/29/20 1208 11/29/20 1859 11/29/20 2113 11/30/20 0759 11/30/20 1128  GLUCAP 170* 130* 180* 170* 157*    Iron Studies: No results for input(s): IRON, TIBC, TRANSFERRIN, FERRITIN in the last 72  hours. Studies/Results: ECHOCARDIOGRAM COMPLETE  Result Date: 11/30/2020    ECHOCARDIOGRAM REPORT   Patient Name:   Tom Scott Date of Exam: 11/30/2020 Medical Rec #:  [id number]     Height:       70.0 in Accession #:    [id number]    Weight:       264.8 lb Date of Birth:  1955-11-16     BSA:          2.352 m Patient Age:    65 years      BP:           131/67 mmHg Patient Gender: M             HR:           84 bpm. Exam Location:  Inpatient Procedure: 2D Echo, Color Doppler, Cardiac Doppler and Intracardiac            Opacification Agent Indications:    I48.91* Unspecified atrial fibrillation  History:        Patient has no prior history of Echocardiogram examinations.                 Arrythmias:Atrial Fibrillation; Risk Factors:Hypertension and  Diabetes.  Sonographer:    Raquel Sarna Senior RDCS Referring Phys: [id number] ALLISON L ELLIS  Sonographer Comments: Poor echo windows due to lung interference. IMPRESSIONS  1. Left ventricular ejection fraction, by estimation, is 65 to 70%. The left ventricle has normal function. The left ventricle has no regional wall motion abnormalities. Left ventricular diastolic function could not be evaluated.  2. Right ventricular systolic function was not well visualized. The right ventricular size is normal. Tricuspid regurgitation signal is inadequate for assessing PA pressure.  3. The mitral valve is normal in structure. No evidence of mitral valve regurgitation. No evidence of mitral stenosis.  4. The aortic valve is normal in structure. Aortic valve regurgitation is not visualized. No aortic stenosis is present. FINDINGS  Left Ventricle: Left ventricular ejection fraction, by estimation, is 65 to 70%. The left ventricle has normal function. The left ventricle has no regional wall motion abnormalities. Definity contrast agent was given IV to delineate the left ventricular  endocardial borders. The left ventricular internal cavity size was normal in size. There is no  left ventricular hypertrophy. Left ventricular diastolic function could not be evaluated due to atrial fibrillation. Left ventricular diastolic function could  not be evaluated. Normal left ventricular filling pressure. Right Ventricle: The right ventricular size is normal. No increase in right ventricular wall thickness. Right ventricular systolic function was not well visualized. Tricuspid regurgitation signal is inadequate for assessing PA pressure. Left Atrium: Left atrial size was not well visualized. Right Atrium: Right atrial size was not well visualized. Pericardium: There is no evidence of pericardial effusion. Mitral Valve: The mitral valve is normal in structure. No evidence of mitral valve regurgitation. No evidence of mitral valve stenosis. Tricuspid Valve: The tricuspid valve is normal in structure. Tricuspid valve regurgitation is not demonstrated. No evidence of tricuspid stenosis. Aortic Valve: The aortic valve is normal in structure. Aortic valve regurgitation is not visualized. No aortic stenosis is present. Pulmonic Valve: The pulmonic valve was not well visualized. Pulmonic valve regurgitation is not visualized. No evidence of pulmonic stenosis. Aorta: The aortic root is normal in size and structure. Venous: The inferior vena cava was not well visualized. IAS/Shunts: No atrial level shunt detected by color flow Doppler.  LEFT VENTRICLE PLAX 2D LVIDd:         3.60 cm  Diastology LVIDs:         2.50 cm  LV e' medial:    12.70 cm/s LV PW:         0.90 cm  LV E/e' medial:  8.7 LV IVS:        0.80 cm  LV e' lateral:   12.00 cm/s LVOT diam:     2.20 cm  LV E/e' lateral: 9.3 LV SV:         68 LV SV Index:   29 LVOT Area:     3.80 cm  RIGHT VENTRICLE RV S prime:     12.90 cm/s TAPSE (M-mode): 2.2 cm LEFT ATRIUM              Index       RIGHT ATRIUM           Index LA diam:        4.30 cm  1.83 cm/m  RA Area:     24.50 cm LA Vol (A2C):   107.0 ml 45.50 ml/m RA Volume:   70.90 ml  30.15 ml/m LA Vol  (A4C):   97.0 ml  41.25 ml/m LA Biplane Vol: 103.0  ml 43.80 ml/m  AORTIC VALVE LVOT Vmax:   118.00 cm/s LVOT Vmean:  75.100 cm/s LVOT VTI:    0.178 m  AORTA Ao Root diam: 3.60 cm MITRAL VALVE MV Area (PHT): 2.85 cm     SHUNTS MV Decel Time: 266 msec     Systemic VTI:  0.18 m MV E velocity: 111.00 cm/s  Systemic Diam: 2.20 cm MV A velocity: 44.80 cm/s MV E/A ratio:  2.48 Fransico Him MD Electronically signed by Fransico Him MD Signature Date/Time: 11/30/2020/1:33:03 PM    Final     (feeding supplement) PROSource Plus  30 mL Oral BID BM   amiodarone  200 mg Oral Daily   apixaban  5 mg Oral BID   vitamin C  500 mg Oral BID   calcium carbonate  1 tablet Oral TID   Chlorhexidine Gluconate Cloth  6 each Topical Q0600   cholecalciferol  1,000 Units Oral Daily   collagenase   Topical Daily   darbepoetin (ARANESP) injection - DIALYSIS  40 mcg Intravenous Q Sat-HD   docusate sodium  100 mg Oral BID   feeding supplement (NEPRO CARB STEADY)  237 mL Oral BID BM   insulin aspart  0-6 Units Subcutaneous TID WC   iron polysaccharides  150 mg Oral Daily   lactose free nutrition  237 mL Oral TID WC   lidocaine  1 patch Transdermal Q24H   midodrine  10 mg Oral Q T,Th,Sa-HD   multivitamin with minerals  1 tablet Oral BID   rosuvastatin  10 mg Oral Daily   sodium chloride flush  10-40 mL Intracatheter Q12H   sodium chloride flush  5 mL Intracatheter Q8H   vitamin A  100,000 Units Oral Daily   Followed by   Derrill Memo ON 12/02/2020] vitamin A  50,000 Units Oral Daily    BMET    Component Value Date/Time   NA 133 (L) 11/29/2020 0052   K 3.4 (L) 11/29/2020 0052   CL 96 (L) 11/29/2020 0052   CO2 26 11/29/2020 0052   GLUCOSE 180 (H) 11/29/2020 0052   BUN 65 (H) 11/29/2020 0052   CREATININE 5.30 (H) 11/29/2020 0052   CALCIUM 8.2 (L) 11/29/2020 0052   GFRNONAA 11 (L) 11/29/2020 0052   CBC    Component Value Date/Time   WBC 10.6 (H) 11/29/2020 0052   RBC 2.78 (L) 11/29/2020 0052   HGB 7.7 (L) 11/29/2020  0052   HCT 24.5 (L) 11/29/2020 0052   PLT 454 (H) 11/29/2020 0052   MCV 88.1 11/29/2020 0052   MCH 27.7 11/29/2020 0052   MCHC 31.4 11/29/2020 0052   RDW 19.0 (H) 11/29/2020 0052    Assessment/Plan:   Acute kidney Injury: Secondary to septic shock/ATN and likely exacerbated by hemodynamic insult from atrial fibrillation with RVR.  No known baseline Scr but was 1.99 at time of presentation.  Started on hemodialysis on 5/23 and at this time he remains anuric and without any evidence of renal recovery.  Continue hemodialysis on a TTS schedule while monitoring for renal recovery.  Unfortunately no significant increase in UOP Tunneled HD catheter placed 11/19/20 Hypotensive on HD so will add midodrine 10 mg before HD moving forward.  Calculus cholecystitis with E. coli bacteremia: Status post percutaneous cholecystostomy on 5/20 with suspected amiodarone associated elevation of LFTs. Atrial fibrillation with rapid ventricular response: Rate control fair on amiodarone, likely compounded by infection from septic arthritis. Microcytic anemia: On oral iron and status post Aranesp-likely compounded by acute/critical illness.  PRBC  transfusion when hemoglobin <7.0. Physical deconditioning: Secondary to acute illness and now limited by bilateral knee pain/swelling from septic arthritis. Septic arthritis: Seen by orthopedic surgery for concerns of knee swelling 2 days ago with aspiration revealing cloudy aspirate in both knees with few GPC from the left knee; consequently underwent arthroscopic debridement of both knees yesterday.  Surgical cultures pending while on intravenous vancomycin. Moderate protein malnutrition:  Presumably due to sepsis. Sacral decubitus ulcer: per notes unstageable pressure injury to inner gluteal cleft/sacrum 4/1 cm.  Topical treatment orders per wound care consult.  This is preventing him from sitting for prolonged periods of time.  Will need gelfoam seat cushion to improve his  tolerance of sitting in a chair.   Deconditioning - pt is too weak for outpatient HD and will benefit from LTC placement with daily PT/OT.    Donetta Potts, MD [employer] [phone]

## 2020-12-01 LAB — COMPREHENSIVE METABOLIC PANEL
ALT: 56 U/L — ABNORMAL HIGH (ref 0–44)
AST: 52 U/L — ABNORMAL HIGH (ref 15–41)
Albumin: 2 g/dL — ABNORMAL LOW (ref 3.5–5.0)
Alkaline Phosphatase: 342 U/L — ABNORMAL HIGH (ref 38–126)
Anion gap: 10 (ref 5–15)
BUN: 58 mg/dL — ABNORMAL HIGH (ref 8–23)
CO2: 27 mmol/L (ref 22–32)
Calcium: 8.3 mg/dL — ABNORMAL LOW (ref 8.9–10.3)
Chloride: 96 mmol/L — ABNORMAL LOW (ref 98–111)
Creatinine, Ser: 5.49 mg/dL — ABNORMAL HIGH (ref 0.61–1.24)
GFR, Estimated: 11 mL/min — ABNORMAL LOW (ref >60)
Glucose, Bld: 145 mg/dL — ABNORMAL HIGH (ref 70–99)
Potassium: 2.7 mmol/L — CL (ref 3.5–5.1)
Sodium: 133 mmol/L — ABNORMAL LOW (ref 135–145)
Total Bilirubin: 1.2 mg/dL (ref 0.3–1.2)
Total Protein: 5.4 g/dL — ABNORMAL LOW (ref 6.5–8.1)

## 2020-12-01 LAB — GLUCOSE, CAPILLARY
Glucose-Capillary: 169 mg/dL — ABNORMAL HIGH (ref 70–99)
Glucose-Capillary: 163 mg/dL — ABNORMAL HIGH (ref 70–99)
Glucose-Capillary: 130 mg/dL — ABNORMAL HIGH (ref 70–99)

## 2020-12-01 LAB — CBC
HCT: 21.4 % — ABNORMAL LOW (ref 39.0–52.0)
Hemoglobin: 6.7 g/dL — CL (ref 13.0–17.0)
MCH: 28.3 pg (ref 26.0–34.0)
MCHC: 31.3 g/dL (ref 30.0–36.0)
MCV: 90.3 fL (ref 80.0–100.0)
Platelets: 407 10*3/uL — ABNORMAL HIGH (ref 150–400)
RBC: 2.37 MIL/uL — ABNORMAL LOW (ref 4.22–5.81)
RDW: 19.4 % — ABNORMAL HIGH (ref 11.5–15.5)
WBC: 9.5 10*3/uL (ref 4.0–10.5)
nRBC: 0 % (ref 0.0–0.2)

## 2020-12-01 LAB — PREPARE RBC (CROSSMATCH)

## 2020-12-01 MED ORDER — SIMETHICONE 80 MG PO CHEW: 80.0000 mg | CHEWABLE_TABLET | Freq: Four times a day (QID) | ORAL | Status: DC | PRN

## 2020-12-01 MED ORDER — MIDODRINE HCL 5 MG PO TABS
ORAL_TABLET | ORAL | Status: AC
  Administered 2020-12-01: 10 mg via ORAL
  Filled 2020-12-01: qty 2

## 2020-12-01 MED ORDER — ALBUMIN HUMAN 25 % IV SOLN: 25.0000 g | Freq: Once | INTRAVENOUS | Status: AC

## 2020-12-01 MED ORDER — DARBEPOETIN ALFA 40 MCG/0.4ML IJ SOSY
PREFILLED_SYRINGE | INTRAMUSCULAR | Status: AC
  Administered 2020-12-01: 40 ug via INTRAVENOUS
  Filled 2020-12-01: qty 0.4

## 2020-12-01 MED ORDER — SIMETHICONE 80 MG PO CHEW
80.0000 mg | CHEWABLE_TABLET | Freq: Three times a day (TID) | ORAL | Status: DC
  Administered 2020-12-01 (×2): 80 mg via ORAL
  Filled 2020-12-01 (×3): qty 1

## 2020-12-01 MED ORDER — SODIUM CHLORIDE 0.9% IV SOLUTION: Freq: Once | INTRAVENOUS | Status: DC

## 2020-12-01 MED ORDER — ALBUMIN HUMAN 25 % IV SOLN
INTRAVENOUS | Status: AC
  Administered 2020-12-01: 25 g via INTRAVENOUS
  Filled 2020-12-01: qty 100

## 2020-12-01 MED ORDER — HEPARIN SODIUM (PORCINE) 1000 UNIT/ML DIALYSIS: 20.0000 [IU]/kg | INTRAMUSCULAR | Status: DC | PRN

## 2020-12-01 NOTE — Progress Notes (Signed)
PROGRESS NOTE  Tom Scott  [id number] DOB: [birth year] DOA: 11/08/2020 PCP: Patient, No Pcp Per (Inactive)   Brief Narrative: Tom Scott is a 65 y.o. male with a history of atrial fibrillation, hypertension, diabetes mellitus, hyperlipidemia initially presented to [facility] secondary to chest pain and epigastric pain.  He developed septic shock requiring pressors.  Reported to have cholecystitis with choledocholithiasis and possible need for ERCP.  Also developed A. fib with RVR.  He was transferred to [facility] for ERCP and due to septic shock requiring vasopressor support.  He was admitted to the ICU.  General surgery and gastroenterology were consulted for management of his acute calculus cholecystitis.  IR consulted for percutaneous cholecystostomy and patient managed with empiric antibiotics.  While admitted, patient developed AKI/ATN requiring initiation of hemodialysis and no signs of renal recovery.  Also while admitted, patient was found to have evidence of ESBL E. coli bacteremia from outside hospital report and has completed 14-day course of meropenem, percutaneous cholecystostomy now removed and needs to follow-up with general surgery in 3 to 4 weeks for laparoscopic cholecystectomy consideration.  Hospital course further complicated by acute arthritis of bilateral knees, suspected septic arthritis based on a positive Gram stain, briefly on empiric IV vancomycin, s/p bilateral knee washout by orthopedics, cultures however negative, septic arthritis ruled out and all antibiotics discontinued.  Assessment & Plan: Principal Problem:   Septic shock ([place]) Active Problems:   Pressure injury of skin   Protein-calorie malnutrition, severe   Encounter for central line placement   AKI (acute kidney injury) ([place])   RUQ abdominal pain   Biliary sepsis   Elevated liver function tests  Septic shock, POA: Secondary to cholecystitis and ESBL E. coli bacteremia.  Patient required ICU admission with vasopressor support.  Resolved.   Acute calculus cholecystitis: General surgery and IR consulted. IR placed percutaneous cholecystostomy on 5/20.  LFTs have almost normalized. Fluid culture significant for E. Coli, in addition to rare bacteroides fragilis.  On 6/6, IR noted gallbladder drain had been withdrawn from the lumen of gallbladder into the peritoneum and there was no tract and hence drain was removed.  No indications for additional drain placement.  General surgery followed up, stable with no signs of acute cholecystitis, due to recent drain placement and inflammation would hold off 3 to 4 weeks prior to interval laparoscopic cholecystectomy, outpatient follow-up with general surgery.  GI evaluated for rising LFTs on 5/29, felt to be secondary to cholecystitis +/- amiodarone.  GI has since signed off on 5/31. Completed 14 days of meropenem.  ID signed off.   - Recheck LFTs with metabolic panel today.   Acute kidney injury: Secondary to ATN secondary to septic shock. Unclear whether this will remain dialysis-dependent or have renal recovery.  - Nephrology following and TTS HD schedule while monitoring for renal recovery. Remains volume overloaded with oliguria. Repeat labs this AM.  - Continue midodrine to support BP on HD days.  Anasarca: Dependent edema likely related to renal failure with echocardiogram demonstrating preserved LVEF and normal RV size/function.  - HD   ESBL E. coli bacteremia: Report received from OSH per PCCM that reports ESBL bacteremia and patient completed 14 days of IV meropenem on 6/3.  Surveillance blood cultures (5/29) without growth.  Developed fever of 101.2 overnight 5/31.  RUE venous Doppler negative for DVT or SVT. Left IJ central line discontinued 6/4.   Acute on chronic bilateral knee arthritis Initially suspected septic arthritis based on a positive Gram  stain in left knee, briefly on empiric IV vancomycin, s/p bilateral  knee washout by orthopedics, cultures however negative, septic arthritis ruled out and all antibiotics discontinued by ID and ID signed off. Right knee synovial fluid from surgery: Extracellular CPPD crystals.  Left knee negative for crystals.  If does not improve?  Role for intra-articular steroids versus systemic steroids.  Uric acid normal.  WBAT.   Acute metabolic encephalopathy Secondary to uremia.  Resolved.   Atrial fibrillation with RVR Patient started on amiodarone for control. On Eliquis for stroke prevention.  Rate controlled.   Asymptomatic anemia Patient with unknown baseline. Hemoglobin of 11 on admission with slow downtrend. No obvious source of bleeding. - On oral iron and s/p Aranesp.  Likely related to critical illness.  S/p 2 units PRBC across HD on 6/2.  Trend CBCs and transfuse if hemoglobin 7 g or below.  Monitor labs across HD.  Hemoglobin again drifting down gradually/7.5.   Essential hypertension Patient is on amlodipine, losartan and metoprolol as an outpatient. Antihypertensives held secondary to hypotension and septic shock.  Blood pressures either normal or soft.  Continue to hold antihypertensives.   Diabetes mellitus, type 2 Patient is on metformin as an outpatient.  Mildly uncontrolled.  Continue SSI and may need adjustment.   Hyperlipidemia -Continue Crestor   Severe malnutrition History of gastric bypass. Worse secondary to critical illness. NG tube inserted on 5/25 and patient started on tube feeds on 5/25. NG tube removed by patient on 5/28. -Oral intake -Dietitian recommendations: Trial Boost Plus chocolate TID- Each supplement provides 360kcal and 14g protein.   30 ml ProSource Plus BID, each supplement provides 100 kcals and 15 grams protein.  MVI with minerals BID per tube 500 mg calcium carbonate TID per tube Supplement Vitamin C 500 mg BID     Pressure injury Medial coccyx, not POA   Pressure Injury 11/12/20 Sacrum Medial Unstageable - Full  thickness tissue loss in which the base of the injury is covered by slough (yellow, tan, gray, green or brown) and/or eschar (tan, brown or black) in the wound bed. (Active)  11/12/20 2000  Location: Sacrum  Location Orientation: Medial  Staging: Unstageable - Full thickness tissue loss in which the base of the injury is covered by slough (yellow, tan, gray, green or brown) and/or eschar (tan, brown or black) in the wound bed.  Wound Description (Comments):   Present on Admission: No      Diarrhea Likely multifactorial due to gallbladder disease and antibiotics.  Significantly improved.   Hypokalemia Resolved.   Right upper extremity pain and swelling ?  Related to asymmetric edema complicating underlying arthritis.  Venous Doppler negative for DVT or SVT.  Elevate and monitor.  Resolved  DVT prophylaxis: Eliquis Code Status:   Code Status: Full Code Family Communication: Wife at bedside Disposition Plan: May need to consider LTAC given severe deconditioning and need for ongoing HD.      Consultants:  PCCM Gastroenterology General surgery Interventional radiology Nephrology Orthopedic   Procedures:  As noted above. Bilateral knee aspiration by orthopedics 6/3 Bilateral knee arthroscopic I&D in OR by orthopedic 6/4   Antimicrobials: Flagyl-discontinued Cefepime-discontinued Meropenem-completed 6/3  Subjective: Feels gas in abdomen, hasn't taken simethicone in days. Feeling generally slightly stronger, filled up urinal ~3/4 of the way last night. Had loose stool about 3am but none since, this is an improvement. No chest pain or abdominal pain.   Objective: Vitals:   11/29/20 2018 11/30/20 [id number] 11/30/20 2133 12/01/20 [id number]  BP: 126/71 131/67 111/68 (!) 106/54  Pulse:  90 80 80  Resp: '18 18 18 17  '$ Temp: 98.6 F (37 C) 98.2 F (36.8 C) 99.5 F (37.5 C) 98.2 F (36.8 C)  TempSrc: Oral Oral Oral Oral  SpO2: 94% 99% 96% 96%  Weight:  120.1 kg  119.5 kg  Height:         Intake/Output Summary (Last 24 hours) at 12/01/2020 0950 Last data filed at 11/30/2020 1215 Gross per 24 hour  Intake 100 ml  Output --  Net 100 ml   Filed Weights   11/29/20 1742 11/30/20 0437 12/01/20 0446  Weight: 115 kg 120.1 kg 119.5 kg   Gen: 65 y.o. male in no distress Pulm: Nonlabored breathing room air. Clear. CV: Regular rate and rhythm. No murmur, rub, or gallop. No definite JVD. GI: Abdomen soft, no RUQ tenderness, non-distended, with normoactive bowel sounds.  Ext: Warm, no deformities. Diffuse anasaraca in all extremities extending to abdominal wall dependently.  Skin: Diffusely pale without jaundice. No new rashes, lesions or ulcers on visualized skin. Neuro: Alert and oriented.Tired-appearing but MAE without focal neurological deficits. Psych: Judgement and insight appear fair. Mood euthymic & affect congruent. Behavior is appropriate.   Data Reviewed: I have personally reviewed following labs and imaging studies  CBC: Recent Labs  Lab 11/24/20 2203 11/27/20 0659 11/28/20 0222 11/29/20 0052  WBC 14.3* 10.6* 9.2 10.6*  HGB 7.9* 7.5* 7.4* 7.7*  HCT 25.0* 23.9* 24.5* 24.5*  MCV 86.2 86.6 88.1 88.1  PLT 434* 491* 465* [id number]*   Basic Metabolic Panel: Recent Labs  Lab 11/24/20 2203 11/27/20 0659 11/29/20 0052  NA 132* 131* 133*  K 4.7 4.1 3.4*  CL 100 97* 96*  CO2 19* 23 26  GLUCOSE 270* 187* 180*  BUN 41* 97* 65*  CREATININE 4.16* 6.38* 5.30*  CALCIUM 8.1* 8.1* 8.2*  PHOS 5.9* 6.3* 4.6   GFR: Estimated Creatinine Clearance: 17.5 mL/min (A) (by C-G formula based on SCr of 5.3 mg/dL (H)). Liver Function Tests: Recent Labs  Lab 11/24/20 2203 11/27/20 0659 11/29/20 0052  ALBUMIN 2.0* 1.9* 2.1*   No results for input(s): LIPASE, AMYLASE in the last 168 hours. No results for input(s): AMMONIA in the last 168 hours. Coagulation Profile: No results for input(s): INR, PROTIME in the last 168 hours. Cardiac Enzymes: No results for input(s):  CKTOTAL, CKMB, CKMBINDEX, TROPONINI in the last 168 hours. BNP (last 3 results) No results for input(s): PROBNP in the last 8760 hours. HbA1C: No results for input(s): HGBA1C in the last 72 hours. CBG: Recent Labs  Lab 11/30/20 0759 11/30/20 1128 11/30/20 1652 11/30/20 2127 12/01/20 0824  GLUCAP 170* 157* 161* 158* 169*   Lipid Profile: No results for input(s): CHOL, HDL, LDLCALC, TRIG, CHOLHDL, LDLDIRECT in the last 72 hours. Thyroid Function Tests: No results for input(s): TSH, T4TOTAL, FREET4, T3FREE, THYROIDAB in the last 72 hours. Anemia Panel: No results for input(s): VITAMINB12, FOLATE, FERRITIN, TIBC, IRON, RETICCTPCT in the last 72 hours. Urine analysis: No results found for: COLORURINE, APPEARANCEUR, LABSPEC, PHURINE, GLUCOSEU, HGBUR, BILIRUBINUR, KETONESUR, PROTEINUR, UROBILINOGEN, NITRITE, LEUKOCYTESUR Recent Results (from the past 240 hour(s))  Body fluid culture w Gram Stain     Status: None   Collection Time: 11/23/20  7:31 PM   Specimen: Body Fluid  Result Value Ref Range Status   Specimen Description FLUID SYNOVIAL RIGHT KNEE  Final   Special Requests Immunocompromised  Final   Gram Stain   Final    RARE WBC  PRESENT, PREDOMINANTLY MONONUCLEAR NO ORGANISMS SEEN    Culture   Final    NO GROWTH 3 DAYS Performed at [facility], [place] [address], [place], [place] [address]    Report Status 11/27/2020 FINAL  Final  Body fluid culture w Gram Stain     Status: None   Collection Time: 11/23/20  7:32 PM   Specimen: Body Fluid  Result Value Ref Range Status   Specimen Description FLUID SYNOVIAL LEFT KNEE  Final   Special Requests Immunocompromised  Final   Gram Stain   Final    FEW WBC PRESENT,BOTH PMN AND MONONUCLEAR RARE GRAM POSITIVE COCCI    Culture   Final    NO GROWTH 3 DAYS Performed at [facility], [place] [address] [place], [place] [address]    Report Status 11/27/2020 FINAL  Final  Aerobic/Anaerobic Culture w Gram Stain (surgical/deep  wound)     Status: None   Collection Time: 11/24/20 12:34 PM   Specimen: PATH Cytology Misc. fluid; Body Fluid  Result Value Ref Range Status   Specimen Description FLUID  Final   Special Requests RIGHT KNEE SYNOVIAL FLUID SPEC A  Final   Gram Stain   Final    FEW WBC PRESENT,BOTH PMN AND MONONUCLEAR NO ORGANISMS SEEN    Culture   Final    No growth aerobically or anaerobically. Performed at [facility], [place] [address] [place], [place] [address]    Report Status 11/29/2020 FINAL  Final  Aerobic/Anaerobic Culture w Gram Stain (surgical/deep wound)     Status: None   Collection Time: 11/24/20 12:36 PM   Specimen: Fluid  Result Value Ref Range Status   Specimen Description FLUID  Final   Special Requests LEFT KNEE SYNOVIAL SPEC B  Final   Gram Stain   Final    MODERATE WBC PRESENT,BOTH PMN AND MONONUCLEAR NO ORGANISMS SEEN    Culture   Final    No growth aerobically or anaerobically. Performed at [facility], [place] [address], [place], [place] [address]    Report Status 11/29/2020 FINAL  Final  Culture, blood (Routine X 2) w Reflex to ID Panel     Status: None   Collection Time: 11/24/20 10:03 PM   Specimen: BLOOD  Result Value Ref Range Status   Specimen Description BLOOD SITE NOT SPECIFIED  Final   Special Requests   Final    BOTTLES DRAWN AEROBIC ONLY Blood Culture results may not be optimal due to an inadequate volume of blood received in culture bottles   Culture   Final    NO GROWTH 5 DAYS Performed at [facility], [place] [address], [place], [place] [address]    Report Status 11/30/2020 FINAL  Final  Culture, blood (Routine X 2) w Reflex to ID Panel     Status: None   Collection Time: 11/24/20 10:03 PM   Specimen: BLOOD  Result Value Ref Range Status   Specimen Description BLOOD SITE NOT SPECIFIED  Final   Special Requests   Final    BOTTLES DRAWN AEROBIC AND ANAEROBIC Blood Culture results may not be optimal due to an inadequate volume of blood  received in culture bottles   Culture   Final    NO GROWTH 5 DAYS Performed at [facility], [place] [address], [place], [place] [address]    Report Status 11/30/2020 FINAL  Final      Radiology Studies: ECHOCARDIOGRAM COMPLETE  Result Date:  11/30/2020    ECHOCARDIOGRAM REPORT   Patient Name:   IOSIF SURACE Date of Exam: 11/30/2020 Medical Rec #:  [id number]     Height:       70.0 in Accession #:    [id number]    Weight:       264.8 lb Date of Birth:  18-Dec-1955     BSA:          2.352 m Patient Age:    28 years      BP:           131/67 mmHg Patient Gender: M             HR:           84 bpm. Exam Location:  Inpatient Procedure: 2D Echo, Color Doppler, Cardiac Doppler and Intracardiac            Opacification Agent Indications:    I48.91* Unspecified atrial fibrillation  History:        Patient has no prior history of Echocardiogram examinations.                 Arrythmias:Atrial Fibrillation; Risk Factors:Hypertension and                 Diabetes.  Sonographer:    Raquel Sarna Senior RDCS Referring Phys: [id number] ALLISON L ELLIS  Sonographer Comments: Poor echo windows due to lung interference. IMPRESSIONS  1. Left ventricular ejection fraction, by estimation, is 65 to 70%. The left ventricle has normal function. The left ventricle has no regional wall motion abnormalities. Left ventricular diastolic function could not be evaluated.  2. Right ventricular systolic function was not well visualized. The right ventricular size is normal. Tricuspid regurgitation signal is inadequate for assessing PA pressure.  3. The mitral valve is normal in structure. No evidence of mitral valve regurgitation. No evidence of mitral stenosis.  4. The aortic valve is normal in structure. Aortic valve regurgitation is not visualized. No aortic stenosis is present. FINDINGS  Left Ventricle: Left ventricular ejection fraction, by estimation, is 65 to 70%. The left ventricle has normal function. The left ventricle has no regional wall  motion abnormalities. Definity contrast agent was given IV to delineate the left ventricular  endocardial borders. The left ventricular internal cavity size was normal in size. There is no left ventricular hypertrophy. Left ventricular diastolic function could not be evaluated due to atrial fibrillation. Left ventricular diastolic function could  not be evaluated. Normal left ventricular filling pressure. Right Ventricle: The right ventricular size is normal. No increase in right ventricular wall thickness. Right ventricular systolic function was not well visualized. Tricuspid regurgitation signal is inadequate for assessing PA pressure. Left Atrium: Left atrial size was not well visualized. Right Atrium: Right atrial size was not well visualized. Pericardium: There is no evidence of pericardial effusion. Mitral Valve: The mitral valve is normal in structure. No evidence of mitral valve regurgitation. No evidence of mitral valve stenosis. Tricuspid Valve: The tricuspid valve is normal in structure. Tricuspid valve regurgitation is not demonstrated. No evidence of tricuspid stenosis. Aortic Valve: The aortic valve is normal in structure. Aortic valve regurgitation is not visualized. No aortic stenosis is present. Pulmonic Valve: The pulmonic valve was not well visualized. Pulmonic valve regurgitation is not visualized. No evidence of pulmonic stenosis. Aorta: The aortic root is normal in size and structure. Venous: The inferior vena cava was not well visualized. IAS/Shunts: No atrial level shunt detected by color flow Doppler.  LEFT VENTRICLE PLAX  2D LVIDd:         3.60 cm  Diastology LVIDs:         2.50 cm  LV e' medial:    12.70 cm/s LV PW:         0.90 cm  LV E/e' medial:  8.7 LV IVS:        0.80 cm  LV e' lateral:   12.00 cm/s LVOT diam:     2.20 cm  LV E/e' lateral: 9.3 LV SV:         68 LV SV Index:   29 LVOT Area:     3.80 cm  RIGHT VENTRICLE RV S prime:     12.90 cm/s TAPSE (M-mode): 2.2 cm LEFT ATRIUM               Index       RIGHT ATRIUM           Index LA diam:        4.30 cm  1.83 cm/m  RA Area:     24.50 cm LA Vol (A2C):   107.0 ml 45.50 ml/m RA Volume:   70.90 ml  30.15 ml/m LA Vol (A4C):   97.0 ml  41.25 ml/m LA Biplane Vol: 103.0 ml 43.80 ml/m  AORTIC VALVE LVOT Vmax:   118.00 cm/s LVOT Vmean:  75.100 cm/s LVOT VTI:    0.178 m  AORTA Ao Root diam: 3.60 cm MITRAL VALVE MV Area (PHT): 2.85 cm     SHUNTS MV Decel Time: 266 msec     Systemic VTI:  0.18 m MV E velocity: 111.00 cm/s  Systemic Diam: 2.20 cm MV A velocity: 44.80 cm/s MV E/A ratio:  2.48 Fransico Him MD Electronically signed by Fransico Him MD Signature Date/Time: 11/30/2020/1:33:03 PM    Final      Scheduled Meds:  (feeding supplement) PROSource Plus  30 mL Oral BID BM   amiodarone  200 mg Oral Daily   apixaban  5 mg Oral BID   vitamin C  500 mg Oral BID   calcium carbonate  1 tablet Oral TID   Chlorhexidine Gluconate Cloth  6 each Topical Q0600   cholecalciferol  1,000 Units Oral Daily   collagenase   Topical Daily   darbepoetin (ARANESP) injection - DIALYSIS  40 mcg Intravenous Q Sat-HD   docusate sodium  100 mg Oral BID   feeding supplement (NEPRO CARB STEADY)  237 mL Oral BID BM   insulin aspart  0-6 Units Subcutaneous TID WC   iron polysaccharides  150 mg Oral Daily   lactose free nutrition  237 mL Oral TID WC   lidocaine  1 patch Transdermal Q24H   midodrine  10 mg Oral Q T,Th,Sa-HD   multivitamin with minerals  1 tablet Oral BID   rosuvastatin  10 mg Oral Daily   sodium chloride flush  10-40 mL Intracatheter Q12H   sodium chloride flush  5 mL Intracatheter Q8H   vitamin A  100,000 Units Oral Daily   Followed by   Derrill Memo ON 12/02/2020] vitamin A  50,000 Units Oral Daily   Continuous Infusions:  sodium chloride       LOS: 23 days   Time spent: 25 minutes.  Patrecia Pour, MD [facility] [email or web address] 12/01/2020, 9:50 AM

## 2020-12-01 NOTE — TOC Progression Note (Signed)
Transition of Care [facility]) - Progression Note    Patient Details  Name: Amrit Nevells MRN: [id number] Date of Birth: 1955/12/01  Transition of Care [facility] CM/SW Contact  Bartholomew Crews, RN Phone Number: [phone] 12/01/2020, 1:50 PM  Clinical Narrative:     Received call from [place] at [facility]. [facility] has received Ship broker. Patient to have HD this afternoon. Spoke with MD about readiness to transition to [facility] - will plan for transition tomorrow 6/12. Spoke with patient, wife, and daughter at the bedside - they are agreeable. TOC following for transition needs.   Expected Discharge Plan: Long Term Acute Care (LTAC) Barriers to Discharge: Continued Medical Work up  Expected Discharge Plan and Services Expected Discharge Plan: Long Term Acute Care (LTAC) In-house Referral: Clinical Social Work Discharge Planning Services: CM Consult Post Acute Care Choice: Long Term Acute Care (LTAC) Living arrangements for the past 2 months: Single Family Home                                       Social Determinants of Health (SDOH) Interventions    Readmission Risk Interventions No flowsheet data found.

## 2020-12-01 NOTE — Plan of Care (Signed)

## 2020-12-01 NOTE — Progress Notes (Addendum)
Patient ID: Tom Scott, male   DOB: Feb 06, 1956, 65 y.o.   MRN: [id number] S: Not feeling well today with abdominal gas pain and some nausea O:BP 110/62 (BP Location: Left Arm)   Pulse 80   Temp 98.2 F (36.8 C) (Oral)   Resp 17   Ht '5\' 10"'$  (1.778 m)   Wt 119.5 kg   SpO2 96%   BMI 37.80 kg/m  No intake or output data in the 24 hours ending 12/01/20 1256 Intake/Output: I/O last 3 completed shifts: In: 340 [P.O.:340] Out: -   Intake/Output this shift:  No intake/output data recorded. Weight change: 1.3 kg Gen: frail, chronically ill-appearing male in NAD CVS: RRR Resp: CTA Abd: benign Ext:1+ BLE edema  Recent Labs  Lab 11/24/20 2203 11/27/20 0659 11/29/20 0052  NA 132* 131* 133*  K 4.7 4.1 3.4*  CL 100 97* 96*  CO2 19* 23 26  GLUCOSE 270* 187* 180*  BUN 41* 97* 65*  CREATININE 4.16* 6.38* 5.30*  ALBUMIN 2.0* 1.9* 2.1*  CALCIUM 8.1* 8.1* 8.2*  PHOS 5.9* 6.3* 4.6   Liver Function Tests: Recent Labs  Lab 11/24/20 2203 11/27/20 0659 11/29/20 0052  ALBUMIN 2.0* 1.9* 2.1*   No results for input(s): LIPASE, AMYLASE in the last 168 hours. No results for input(s): AMMONIA in the last 168 hours. CBC: Recent Labs  Lab 11/24/20 2203 11/27/20 0659 11/28/20 0222 11/29/20 0052  WBC 14.3* 10.6* 9.2 10.6*  HGB 7.9* 7.5* 7.4* 7.7*  HCT 25.0* 23.9* 24.5* 24.5*  MCV 86.2 86.6 88.1 88.1  PLT 434* 491* 465* 454*   Cardiac Enzymes: No results for input(s): CKTOTAL, CKMB, CKMBINDEX, TROPONINI in the last 168 hours. CBG: Recent Labs  Lab 11/30/20 1128 11/30/20 1652 11/30/20 2127 12/01/20 0824 12/01/20 1207  GLUCAP 157* 161* 158* 169* 163*    Iron Studies: No results for input(s): IRON, TIBC, TRANSFERRIN, FERRITIN in the last 72 hours. Studies/Results: ECHOCARDIOGRAM COMPLETE  Result Date: 11/30/2020    ECHOCARDIOGRAM REPORT   Patient Name:   Tom Scott Date of Exam: 11/30/2020 Medical Rec #:  [id number]     Height:       70.0 in Accession #:    [id number]     Weight:       264.8 lb Date of Birth:  Jul 12, 1955     BSA:          2.352 m Patient Age:    89 years      BP:           131/67 mmHg Patient Gender: M             HR:           84 bpm. Exam Location:  Inpatient Procedure: 2D Echo, Color Doppler, Cardiac Doppler and Intracardiac            Opacification Agent Indications:    I48.91* Unspecified atrial fibrillation  History:        Patient has no prior history of Echocardiogram examinations.                 Arrythmias:Atrial Fibrillation; Risk Factors:Hypertension and                 Diabetes.  Sonographer:    Raquel Sarna Senior RDCS Referring Phys: [id number] ALLISON L ELLIS  Sonographer Comments: Poor echo windows due to lung interference. IMPRESSIONS  1. Left ventricular ejection fraction, by estimation, is 65 to 70%. The left ventricle has normal function. The left ventricle has no  regional wall motion abnormalities. Left ventricular diastolic function could not be evaluated.  2. Right ventricular systolic function was not well visualized. The right ventricular size is normal. Tricuspid regurgitation signal is inadequate for assessing PA pressure.  3. The mitral valve is normal in structure. No evidence of mitral valve regurgitation. No evidence of mitral stenosis.  4. The aortic valve is normal in structure. Aortic valve regurgitation is not visualized. No aortic stenosis is present. FINDINGS  Left Ventricle: Left ventricular ejection fraction, by estimation, is 65 to 70%. The left ventricle has normal function. The left ventricle has no regional wall motion abnormalities. Definity contrast agent was given IV to delineate the left ventricular  endocardial borders. The left ventricular internal cavity size was normal in size. There is no left ventricular hypertrophy. Left ventricular diastolic function could not be evaluated due to atrial fibrillation. Left ventricular diastolic function could  not be evaluated. Normal left ventricular filling pressure. Right Ventricle: The  right ventricular size is normal. No increase in right ventricular wall thickness. Right ventricular systolic function was not well visualized. Tricuspid regurgitation signal is inadequate for assessing PA pressure. Left Atrium: Left atrial size was not well visualized. Right Atrium: Right atrial size was not well visualized. Pericardium: There is no evidence of pericardial effusion. Mitral Valve: The mitral valve is normal in structure. No evidence of mitral valve regurgitation. No evidence of mitral valve stenosis. Tricuspid Valve: The tricuspid valve is normal in structure. Tricuspid valve regurgitation is not demonstrated. No evidence of tricuspid stenosis. Aortic Valve: The aortic valve is normal in structure. Aortic valve regurgitation is not visualized. No aortic stenosis is present. Pulmonic Valve: The pulmonic valve was not well visualized. Pulmonic valve regurgitation is not visualized. No evidence of pulmonic stenosis. Aorta: The aortic root is normal in size and structure. Venous: The inferior vena cava was not well visualized. IAS/Shunts: No atrial level shunt detected by color flow Doppler.  LEFT VENTRICLE PLAX 2D LVIDd:         3.60 cm  Diastology LVIDs:         2.50 cm  LV e' medial:    12.70 cm/s LV PW:         0.90 cm  LV E/e' medial:  8.7 LV IVS:        0.80 cm  LV e' lateral:   12.00 cm/s LVOT diam:     2.20 cm  LV E/e' lateral: 9.3 LV SV:         68 LV SV Index:   29 LVOT Area:     3.80 cm  RIGHT VENTRICLE RV S prime:     12.90 cm/s TAPSE (M-mode): 2.2 cm LEFT ATRIUM              Index       RIGHT ATRIUM           Index LA diam:        4.30 cm  1.83 cm/m  RA Area:     24.50 cm LA Vol (A2C):   107.0 ml 45.50 ml/m RA Volume:   70.90 ml  30.15 ml/m LA Vol (A4C):   97.0 ml  41.25 ml/m LA Biplane Vol: 103.0 ml 43.80 ml/m  AORTIC VALVE LVOT Vmax:   118.00 cm/s LVOT Vmean:  75.100 cm/s LVOT VTI:    0.178 m  AORTA Ao Root diam: 3.60 cm MITRAL VALVE MV Area (PHT): 2.85 cm     SHUNTS MV Decel  Time: 266 msec  Systemic VTI:  0.18 m MV E velocity: 111.00 cm/s  Systemic Diam: 2.20 cm MV A velocity: 44.80 cm/s MV E/A ratio:  2.48 Fransico Him MD Electronically signed by Fransico Him MD Signature Date/Time: 11/30/2020/1:33:03 PM    Final     (feeding supplement) PROSource Plus  30 mL Oral BID BM   amiodarone  200 mg Oral Daily   apixaban  5 mg Oral BID   vitamin C  500 mg Oral BID   calcium carbonate  1 tablet Oral TID   Chlorhexidine Gluconate Cloth  6 each Topical Q0600   cholecalciferol  1,000 Units Oral Daily   collagenase   Topical Daily   darbepoetin (ARANESP) injection - DIALYSIS  40 mcg Intravenous Q Sat-HD   docusate sodium  100 mg Oral BID   feeding supplement (NEPRO CARB STEADY)  237 mL Oral BID BM   insulin aspart  0-6 Units Subcutaneous TID WC   iron polysaccharides  150 mg Oral Daily   lactose free nutrition  237 mL Oral TID WC   lidocaine  1 patch Transdermal Q24H   midodrine  10 mg Oral Q T,Th,Sa-HD   multivitamin with minerals  1 tablet Oral BID   rosuvastatin  10 mg Oral Daily   simethicone  80 mg Oral TID   sodium chloride flush  10-40 mL Intracatheter Q12H   sodium chloride flush  5 mL Intracatheter Q8H   vitamin A  100,000 Units Oral Daily   Followed by   Derrill Memo ON 12/02/2020] vitamin A  50,000 Units Oral Daily    BMET    Component Value Date/Time   NA 133 (L) 11/29/2020 0052   K 3.4 (L) 11/29/2020 0052   CL 96 (L) 11/29/2020 0052   CO2 26 11/29/2020 0052   GLUCOSE 180 (H) 11/29/2020 0052   BUN 65 (H) 11/29/2020 0052   CREATININE 5.30 (H) 11/29/2020 0052   CALCIUM 8.2 (L) 11/29/2020 0052   GFRNONAA 11 (L) 11/29/2020 0052   CBC    Component Value Date/Time   WBC 10.6 (H) 11/29/2020 0052   RBC 2.78 (L) 11/29/2020 0052   HGB 7.7 (L) 11/29/2020 0052   HCT 24.5 (L) 11/29/2020 0052   PLT 454 (H) 11/29/2020 0052   MCV 88.1 11/29/2020 0052   MCH 27.7 11/29/2020 0052   MCHC 31.4 11/29/2020 0052   RDW 19.0 (H) 11/29/2020 0052   Assessment/Plan:    Acute kidney Injury: Secondary to septic shock/ATN and likely exacerbated by hemodynamic insult from atrial fibrillation with RVR.  No known baseline Scr but was 1.99 at time of presentation.  Started on hemodialysis on 5/23 and at this time he remains anuric and without any evidence of renal recovery.  Continue hemodialysis on a TTS schedule while monitoring for renal recovery.  Unfortunately no significant increase in UOP Tunneled HD catheter placed 11/19/20 Hypotensive on HD so will add midodrine 10 mg before HD moving forward.  Tolerated 3 Liters UF with HD on 11/29/20 Plan for ongoing Hd on TTS schedule.  Calculus cholecystitis with E. coli bacteremia: Status post percutaneous cholecystostomy on 5/20 with suspected amiodarone associated elevation of LFTs. Atrial fibrillation with rapid ventricular response: Rate control fair on amiodarone, likely compounded by infection from septic arthritis. Microcytic anemia: On oral iron and status post Aranesp-likely compounded by acute/critical illness.  PRBC transfusion when hemoglobin <7.0. Physical deconditioning: Secondary to acute illness and now limited by bilateral knee pain/swelling from septic arthritis. Septic arthritis: Seen by orthopedic surgery for concerns of  knee swelling 2 days ago with aspiration revealing cloudy aspirate in both knees with few GPC from the left knee; consequently underwent arthroscopic debridement of both knees yesterday.  Surgical cultures pending while on intravenous vancomycin. Moderate protein malnutrition:  Presumably due to sepsis. Sacral decubitus ulcer: per notes unstageable pressure injury to inner gluteal cleft/sacrum 4/1 cm.  Topical treatment orders per wound care consult.  This is preventing him from sitting for prolonged periods of time.  Will need gelfoam seat cushion to improve his tolerance of sitting in a chair.   Deconditioning - pt is too weak for outpatient HD and will benefit from LTC placement with daily  PT/OT.    Donetta Potts, MD [employer] [phone]

## 2020-12-02 LAB — TYPE AND SCREEN
ABO/RH(D): A POS
Antibody Screen: NEGATIVE
Unit division: 0
Unit division: 0

## 2020-12-02 LAB — COMPREHENSIVE METABOLIC PANEL
ALT: 51 U/L — ABNORMAL HIGH (ref 0–44)
AST: 43 U/L — ABNORMAL HIGH (ref 15–41)
Albumin: 2.3 g/dL — ABNORMAL LOW (ref 3.5–5.0)
Alkaline Phosphatase: 338 U/L — ABNORMAL HIGH (ref 38–126)
Anion gap: 10 (ref 5–15)
BUN: 29 mg/dL — ABNORMAL HIGH (ref 8–23)
CO2: 27 mmol/L (ref 22–32)
Calcium: 8.1 mg/dL — ABNORMAL LOW (ref 8.9–10.3)
Chloride: 97 mmol/L — ABNORMAL LOW (ref 98–111)
Creatinine, Ser: 3.55 mg/dL — ABNORMAL HIGH (ref 0.61–1.24)
GFR, Estimated: 18 mL/min — ABNORMAL LOW (ref >60)
Glucose, Bld: 125 mg/dL — ABNORMAL HIGH (ref 70–99)
Potassium: 2.8 mmol/L — ABNORMAL LOW (ref 3.5–5.1)
Sodium: 134 mmol/L — ABNORMAL LOW (ref 135–145)
Total Bilirubin: 1.2 mg/dL (ref 0.3–1.2)
Total Protein: 6 g/dL — ABNORMAL LOW (ref 6.5–8.1)

## 2020-12-02 LAB — CBC
HCT: 27.7 % — ABNORMAL LOW (ref 39.0–52.0)
Hemoglobin: 8.9 g/dL — ABNORMAL LOW (ref 13.0–17.0)
MCH: 28.5 pg (ref 26.0–34.0)
MCHC: 32.1 g/dL (ref 30.0–36.0)
MCV: 88.8 fL (ref 80.0–100.0)
Platelets: 368 10*3/uL (ref 150–400)
RBC: 3.12 MIL/uL — ABNORMAL LOW (ref 4.22–5.81)
RDW: 18.5 % — ABNORMAL HIGH (ref 11.5–15.5)
WBC: 9.4 10*3/uL (ref 4.0–10.5)
nRBC: 0 % (ref 0.0–0.2)

## 2020-12-02 LAB — BPAM RBC
Blood Product Expiration Date: 202207042359
Blood Product Expiration Date: 202207042359
ISSUE DATE / TIME: 202206111647
ISSUE DATE / TIME: 202206111647
Unit Type and Rh: 6200
Unit Type and Rh: 6200

## 2020-12-02 LAB — MAGNESIUM: Magnesium: 2.1 mg/dL (ref 1.7–2.4)

## 2020-12-02 LAB — GLUCOSE, CAPILLARY: Glucose-Capillary: 115 mg/dL — ABNORMAL HIGH (ref 70–99)

## 2020-12-02 MED ORDER — POLYSACCHARIDE IRON COMPLEX 150 MG PO CAPS: 150.0000 mg | ORAL_CAPSULE | Freq: Every day | ORAL | Status: AC

## 2020-12-02 MED ORDER — APIXABAN 5 MG PO TABS: 5.0000 mg | ORAL_TABLET | Freq: Two times a day (BID) | ORAL | Status: AC

## 2020-12-02 MED ORDER — ONDANSETRON HCL 4 MG PO TABS
4.0000 mg | ORAL_TABLET | Freq: Four times a day (QID) | ORAL | 0 refills | Status: AC | PRN
Start: 2020-12-02 — End: ?

## 2020-12-02 MED ORDER — VITAMIN D3 25 MCG PO TABS
1000.0000 [IU] | ORAL_TABLET | Freq: Every day | ORAL | Status: AC
Start: 2020-12-02 — End: ?

## 2020-12-02 MED ORDER — ACETAMINOPHEN 325 MG PO TABS: 650.0000 mg | ORAL_TABLET | ORAL | Status: AC | PRN

## 2020-12-02 MED ORDER — INSULIN ASPART 100 UNIT/ML IJ SOLN: 0.0000 [IU] | Freq: Three times a day (TID) | INTRAMUSCULAR | 11 refills | Status: AC

## 2020-12-02 MED ORDER — PROSOURCE PLUS PO LIQD
30.0000 mL | Freq: Two times a day (BID) | ORAL | Status: AC
Start: 2020-12-02 — End: ?

## 2020-12-02 MED ORDER — LIDOCAINE 5 % EX PTCH: 1.0000 | MEDICATED_PATCH | CUTANEOUS | 0 refills | Status: AC

## 2020-12-02 MED ORDER — POTASSIUM CHLORIDE CRYS ER 20 MEQ PO TBCR: 40.0000 meq | EXTENDED_RELEASE_TABLET | Freq: Once | ORAL | Status: DC

## 2020-12-02 MED ORDER — AMIODARONE HCL 200 MG PO TABS: 200.0000 mg | ORAL_TABLET | Freq: Every day | ORAL | Status: AC

## 2020-12-02 MED ORDER — COLLAGENASE 250 UNIT/GM EX OINT: TOPICAL_OINTMENT | Freq: Every day | CUTANEOUS | 0 refills | Status: AC

## 2020-12-02 MED ORDER — BOOST PLUS PO LIQD
237.0000 mL | Freq: Three times a day (TID) | ORAL | 0 refills | Status: AC
Start: 2020-12-02 — End: ?

## 2020-12-02 MED ORDER — CALCIUM CARBONATE ANTACID 500 MG PO CHEW: 1.0000 | CHEWABLE_TABLET | Freq: Three times a day (TID) | ORAL | Status: AC

## 2020-12-02 MED ORDER — MIDODRINE HCL 10 MG PO TABS: 10.0000 mg | ORAL_TABLET | ORAL | Status: AC

## 2020-12-02 MED ORDER — NEPRO/CARBSTEADY PO LIQD: 237.0000 mL | Freq: Two times a day (BID) | ORAL | 0 refills | Status: AC

## 2020-12-02 MED ORDER — SIMETHICONE 80 MG PO CHEW: 80.0000 mg | CHEWABLE_TABLET | Freq: Four times a day (QID) | ORAL | 0 refills | Status: AC | PRN

## 2020-12-02 MED ORDER — ADULT MULTIVITAMIN W/MINERALS CH: 1.0000 | ORAL_TABLET | Freq: Every day | ORAL | Status: AC

## 2020-12-02 MED ORDER — ASCORBIC ACID 500 MG PO TABS: 500.0000 mg | ORAL_TABLET | Freq: Two times a day (BID) | ORAL | Status: AC

## 2020-12-02 MED ORDER — VITAMIN A 3 MG (10000 UNIT) PO CAPS: 50000.0000 [IU] | ORAL_CAPSULE | Freq: Every day | ORAL | Status: AC

## 2020-12-02 MED ORDER — DARBEPOETIN ALFA 40 MCG/0.4ML IJ SOSY: 40.0000 ug | PREFILLED_SYRINGE | INTRAMUSCULAR | Status: AC

## 2020-12-02 NOTE — TOC Transition Note (Addendum)
Transition of Care [facility]) - CM/SW Discharge Note   Patient Details  Name: Tom Scott MRN: [id number] Date of Birth: December 30, 1955  [facility] CM/SW Contact:  Bartholomew Crews, RN Phone Number: [phone] 12/02/2020, 9:34 AM   Clinical Narrative:     Patient to transition to [facility] today. DC order/summary complete. PTAR arranged. Spoke with spouse on hospital room phone. No further TOC needs identified at this time.   Nurse to call report to [phone]. Accepting MD: Vincente Liberty. Patient to go to room 326.  Final next level of care: Long Term Acute Care (LTAC) Barriers to Discharge: No Barriers Identified   Patient Goals and CMS Choice Patient states their goals for this hospitalization and ongoing recovery are:: LTAC for rehab CMS Medicare.gov Compare Post Acute Care list provided to:: Patient Choice offered to / list presented to : Patient, Spouse, Adult Children  Discharge Placement                  Name of family member notified: Alfred Fimbres (spouse) Patient and family notified of of transfer: 12/02/20  Discharge Plan and Services In-house Referral: Clinical Social Work Discharge Planning Services: CM Consult Post Acute Care Choice: Long Term Acute Care (LTAC)          DME Arranged: N/A DME Agency: NA       HH Arranged: NA HH Agency: NA        Social Determinants of Health (SDOH) Interventions     Readmission Risk Interventions No flowsheet data found.

## 2020-12-02 NOTE — Progress Notes (Signed)
Pt just had 13 beats of vtach per telemetry. , notified MD on call. RN Assessed pt. Pt is alert and oriented with no signs of distress. Pt was just turned from side to side as NT is cleaning him up from having a bowel movement.  Will monitor pt.

## 2020-12-02 NOTE — Discharge Summary (Signed)
Physician Discharge Summary  Lavoris Sparling GMW:[id number] DOB: [birth year] DOA: 11/08/2020  PCP: Patient, No Pcp Per (Inactive)  Admit date: 11/08/2020 Discharge date: 12/02/2020  Admitted From: Home Disposition: LTAC   Recommendations for Outpatient Follow-up:  Follow labs and perform HD as indicated. Continue therapy for rehabilitation. See below for full details.  Home Health: N/A Equipment/Devices: [facility] Discharge Condition: Stable CODE STATUS: Full Diet recommendation: Renal.  Brief/Interim Summary: Linda Biehn is a 65 y.o. male with a history of atrial fibrillation, hypertension, diabetes mellitus, hyperlipidemia initially presented to [facility] secondary to chest pain and epigastric pain.  He developed septic shock requiring pressors.  Reported to have cholecystitis with choledocholithiasis and possible need for ERCP.  Also developed A. fib with RVR.  He was transferred to [facility] for ERCP and due to septic shock requiring vasopressor support.  He was admitted to the ICU.  General surgery and gastroenterology were consulted for management of his acute calculous cholecystitis.  IR consulted for percutaneous cholecystostomy and patient managed with empiric antibiotics.  While admitted, patient developed AKI/ATN requiring initiation of hemodialysis and no signs of renal recovery.  Also while admitted, patient was found to have evidence of ESBL E. coli bacteremia from outside hospital report and has completed 14-day course of meropenem, percutaneous cholecystostomy now removed and needs to follow-up with general surgery in 3 to 4 weeks for laparoscopic cholecystectomy consideration.  Hospital course further complicated by acute arthritis of bilateral knees, suspected septic arthritis based on a positive Gram stain, briefly on empiric IV vancomycin, s/p bilateral knee washout by orthopedics, cultures however negative, septic arthritis ruled out and all antibiotics  discontinued.  He continues to have anasarca and renal failure (unclear whether acute or will be ESRD at this time), requiring dialysis and need for LTAC level of care which is being pursued.  Discharge Diagnoses:  Principal Problem:   Septic shock ([place]) Active Problems:   Pressure injury of skin   Protein-calorie malnutrition, severe   Encounter for central line placement   AKI (acute kidney injury) ([place])   RUQ abdominal pain   Biliary sepsis   Elevated liver function tests  Septic shock, POA: Secondary to cholecystitis and ESBL E. coli bacteremia. Patient required ICU admission with vasopressor support.  Resolved.   Acute calculus cholecystitis: General surgery and IR consulted. IR placed percutaneous cholecystostomy on 5/20.  LFTs have almost normalized. Fluid culture significant for E. Coli, in addition to rare bacteroides fragilis.  On 6/6, IR noted gallbladder drain had been withdrawn from the lumen of gallbladder into the peritoneum and there was no tract and hence drain was removed.  No indications for additional drain placement.  General surgery followed up, stable with no signs of acute cholecystitis, due to recent drain placement and inflammation would hold off 3 to 4 weeks prior to interval laparoscopic cholecystectomy, outpatient follow-up with general surgery.  GI evaluated for rising LFTs on 5/29, felt to be secondary to cholecystitis +/- amiodarone.  GI has since signed off on 5/31. Completed 14 days of meropenem.  ID signed off.   - Recheck LFTs with metabolic panel today.   Acute kidney injury: Secondary to ATN secondary to septic shock. Unclear whether this will remain dialysis-dependent or have renal recovery. - Nephrology following on TTS HD schedule while monitoring for renal recovery. Remains volume overloaded with oliguria.  - Continue midodrine to support BP on HD days.   Anasarca: Dependent edema likely related to renal failure with echocardiogram demonstrating  preserved  LVEF and normal RV size/function. - HD   ESBL E. coli bacteremia: Report received from OSH per PCCM that reports ESBL bacteremia and patient completed 14 days of IV meropenem on 6/3.  Surveillance blood cultures (5/29) without growth.    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 Right knee synovial fluid from surgery: Extracellular CPPD crystals. Uric acid normal.  WBAT.   Acute metabolic encephalopathy Secondary to uremia.  Resolved.   Atrial fibrillation with RVR Patient started on amiodarone for control. On Eliquis for stroke prevention.  Rate controlled.   Anemia: Due to acute illness and kidney failure Patient with unknown baseline. Hemoglobin of 11 on admission with slow downtrend. No obvious source of bleeding. - On oral iron and s/p Aranesp.  Likely related to critical illness.  S/p 2 units PRBC across HD on 6/2 and again on 6/11.  Trend CBCs and transfuse if hemoglobin 7 g or below.   Vitamin A deficiency: Continue supplementation.  Essential hypertension Patient is on amlodipine, losartan and metoprolol as an outpatient. Antihypertensives held secondary to hypotension and septic shock.  Blood pressures either normal or soft.  Continue to hold antihypertensives.   Diabetes mellitus, type 2 Patient is on metformin as an outpatient.  Mildly uncontrolled.  Continue SSI and may need adjustment.   Hyperlipidemia -Continue Crestor   Severe malnutrition History of gastric bypass. Worse secondary to critical illness. NG tube inserted on 5/25 and patient started on tube feeds on 5/25. NG tube removed by patient on 5/28. -Oral intake -Dietitian recommendations: Trial Boost Plus chocolate TID- Each  supplement provides 360kcal and 14g protein.   30 ml ProSource Plus BID, each supplement provides 100 kcals and 15 grams protein.  MVI with minerals BID per tube 500 mg calcium carbonate TID per tube Supplement Vitamin C 500 mg BID     Pressure injury: Medial coccyx, unstageable, not POA. Continue offloading as much as reasonable.    Diarrhea Likely multifactorial due to gallbladder disease and antibiotics.  Significantly improved.   Hypokalemia Manage with dialysis, will supplement cautiously on day of discharge.   Right upper extremity pain and swelling ?  Related to asymmetric edema complicating underlying arthritis.  Venous Doppler negative for DVT or SVT.  Elevate and monitor.    Discharge Instructions  Allergies as of 12/02/2020       Reactions   Penicillamine    Other reaction(s): lips swell   Penicillins    Other reaction(s): Swelling Legacy System: CCA Onset Date: <blank> Substance Legacy/Cerner: penicillins / penicillins (Legacy value) Category: Drug Severity Legacy/Cerner: <blank> / Unknown Reaction(s): swelling Comments: <blank> Legacy System: CCA Onset Date: <blank> Substance Legacy/Cerner: ampicillin / ampicillin (Legacy value) Category: Drug Severity Legacy/Cerner: <blank> / Unknown Reaction(s): swelling Comments: <blank> Legacy System: CCA Onset Date: <blank> Substance Legacy/Cerner: ampicillin / ampicillin (Legacy value) Category: Drug Severity Legacy/Cerner: <blank> / Unknown Reaction(s): swelling Comments: <blank> Legacy System: CCA Onset Date: <blank> Substance Legacy/Cerner: penicillins / penicillins (Legacy value) Category: Drug Severity Legacy/Cerner: <blank> / Unknown Reaction(s): swelling Comments: <blank>        Medication List     STOP taking these medications    amLODipine 10 MG tablet Commonly known as: NORVASC   aspirin EC 81 MG tablet   furosemide 20 MG tablet  Commonly known as: LASIX   hydrOXYzine 10 MG  tablet Commonly known as: ATARAX/VISTARIL   irbesartan 150 MG tablet Commonly known as: AVAPRO   metFORMIN 500 MG tablet Commonly known as: GLUCOPHAGE   metoprolol succinate 50 MG 24 hr tablet Commonly known as: TOPROL-XL   NONFORMULARY OR COMPOUNDED ITEM       TAKE these medications    acetaminophen 325 MG tablet Commonly known as: TYLENOL Take 2 tablets (650 mg total) by mouth every 4 (four) hours as needed for mild pain, moderate pain, fever or headache.   amiodarone 200 MG tablet Commonly known as: PACERONE Take 1 tablet (200 mg total) by mouth daily.   apixaban 5 MG Tabs tablet Commonly known as: ELIQUIS Take 1 tablet (5 mg total) by mouth 2 (two) times daily.   ascorbic acid 500 MG tablet Commonly known as: VITAMIN C Take 1 tablet (500 mg total) by mouth 2 (two) times daily.   calcium carbonate 500 MG chewable tablet Commonly known as: TUMS - dosed in mg elemental calcium Chew 1 tablet (200 mg of elemental calcium total) by mouth 3 (three) times daily.   collagenase ointment Commonly known as: SANTYL Apply topically daily.   Darbepoetin Alfa 40 MCG/0.4ML Sosy injection Commonly known as: ARANESP Inject 0.4 mLs (40 mcg total) into the vein every Saturday with hemodialysis. Start taking on: December 08, 2020   insulin aspart 100 UNIT/ML injection Commonly known as: novoLOG Inject 0-6 Units into the skin 3 (three) times daily with meals.   iron polysaccharides 150 MG capsule Commonly known as: NIFEREX Take 1 capsule (150 mg total) by mouth daily.   lactose free nutrition Liqd Take 237 mLs by mouth 3 (three) times daily with meals.   (feeding supplement) PROSource Plus liquid Take 30 mLs by mouth 2 (two) times daily between meals.   feeding supplement (NEPRO CARB STEADY) Liqd Take 237 mLs by mouth 2 (two) times daily between meals.   lidocaine 5 % Commonly known as: LIDODERM Place 1 patch onto the skin daily. Remove & Discard patch within 12 hours or  as directed by MD   midodrine 10 MG tablet Commonly known as: PROAMATINE Take 1 tablet (10 mg total) by mouth Every Tuesday,Thursday,and Saturday with dialysis. Start taking on: December 04, 2020   multivitamin with minerals Tabs tablet Take 1 tablet by mouth daily.   ondansetron 4 MG tablet Commonly known as: ZOFRAN Take 1 tablet (4 mg total) by mouth every 6 (six) hours as needed for nausea.   rosuvastatin 10 MG tablet Commonly known as: CRESTOR Take 10 mg by mouth at bedtime.   simethicone 80 MG chewable tablet Commonly known as: MYLICON Chew 1 tablet (80 mg total) by mouth 4 (four) times daily as needed for flatulence.   vitamin A 3 MG (10000 UNITS) capsule Take 5 capsules (50,000 Units total) by mouth daily for 3 days.   Vitamin D3 25 MCG tablet Commonly known as: Vitamin D Take 1 tablet (1,000 Units total) by mouth daily.        Contact information for follow-up providers     Rolm Bookbinder, MD Follow up on 01/02/2021.   Specialty: General Surgery Why: arrive at 3:10pm, for a 3:40pm appointment to fill out paperwork and check in process Contact information: [address] [place] [address] [phone]         [place] Follow up.   Specialty: Family Medicine Why: call to set up Primary care  MD Contact information: [place] [address] [phone]             Contact information for after-discharge care     Destination     [facility] Preferred SNF .   Service: Skilled Nursing Contact information: [address] [address] [phone]                    Allergies  Allergen Reactions   Penicillamine     Other reaction(s): lips swell   Penicillins     Other reaction(s): Swelling  Legacy System: CCA Onset Date: <blank> Substance Legacy/Cerner: penicillins / penicillins (Legacy value) Category: Drug Severity  Legacy/Cerner: <blank> / Unknown Reaction(s): swelling Comments: <blank>  Legacy System: CCA Onset Date: <blank> Substance Legacy/Cerner: ampicillin / ampicillin (Legacy value) Category: Drug Severity Legacy/Cerner: <blank> / Unknown Reaction(s): swelling Comments: <blank>  Legacy System: CCA Onset Date: <blank> Substance Legacy/Cerner: ampicillin / ampicillin (Legacy value) Category: Drug Severity Legacy/Cerner: <blank> / Unknown Reaction(s): swelling Comments: <blank>  Legacy System: CCA Onset Date: <blank> Substance Legacy/Cerner: penicillins / penicillins (Legacy value) Category: Drug Severity Legacy/Cerner: <blank> / Unknown Reaction(s): swelling Comments: <blank>    Consultants:  PCCM Gastroenterology General surgery Interventional radiology Nephrology Orthopedic  Procedures:  As noted above. Bilateral knee aspiration by orthopedics 6/3 Bilateral knee arthroscopic I&D in OR by orthopedic 6/4  CT ABDOMEN PELVIS WO CONTRAST  Result Date: 11/09/2020 CLINICAL DATA:  Nausea and vomiting EXAM: CT ABDOMEN AND PELVIS WITHOUT CONTRAST TECHNIQUE: Multidetector CT imaging of the abdomen and pelvis was performed following the standard protocol without IV contrast. COMPARISON:  Abdominal ultrasound Nov 08, 2020. FINDINGS: Lower chest: Evaluation of the lung bases is limited by respiratory motion. Bibasilar airspace consolidations. Partially visualized central venous catheter with tip overlying the superior cavoatrial junction. Coronary artery calcifications. Hepatobiliary: Unremarkable noncontrast appearance of the hepatic parenchyma. No portal venous gas. Gallbladder is distended with layering cholelithiasis wall thickening and pericholecystic fluid consistent with acute calculus cholecystitis. No biliary ductal dilation. Pancreas: Fatty replacement of the pancreatic parenchyma. No pancreatic ductal dilation. Spleen: Within normal limits. Adrenals/Urinary Tract: Bilateral  adrenal glands are unremarkable. No hydronephrosis. No contour deforming renal masses. Urinary bladder is decompressed around a Foley catheter. Stomach/Bowel: Postsurgical changes of gastric bypass. Nasogastric tube with tip in the Roux limb. Prominent loops of contrast filled small bowel in the left upper quadrant without abrupt transition to decompressed bowel. Appendix not visualized. No colonic wall or adjacent inflammation. Vascular/Lymphatic: IVC filter. Aortic atherosclerosis without aneurysmal dilation. No pathologically enlarged abdominal or pelvic lymph nodes. Reproductive: Prostate is unremarkable. Other: Pericholecystic predominant right upper quadrant stranding with trace fluid in the right pericolic gutter without walled off collection. No pneumoperitoneum. Musculoskeletal: Multilevel degenerative changes spine with bony ankylosis of the thoracolumbar spine. No acute osseous abnormality. IMPRESSION: 1. Findings consistent with acute calculus cholecystitis. No evidence of perforation. No biliary ductal dilation. 2. Bibasilar airspace consolidations may represent atelectasis or pneumonia. 3. Postsurgical changes of gastric bypass. Prominent loops of contrast filled small bowel in the left upper quadrant without abrupt transition to decompressed bowel. Findings may represent ileus. 4. Aortic atherosclerosis. Aortic Atherosclerosis (ICD10-I70.0). Electronically Signed   By: Dahlia Bailiff MD   On: 11/09/2020 00:36   DG Chest 1 View  Result Date: 11/12/2020 Mike Craze, DO     11/12/2020 12:16 PM Central Venous Catheter Insertion Procedure Note Oryn Casanova [id number] 1955-07-16 Procedure: Insertion of Non-tunneled HD Catheter with US guidance Indications: HD  Procedure Details Consent: Risks of procedure as well as the alternatives and risks of each were explained to the (patient/caregiver).  Consent for procedure obtained. Time Out: Verified patient identification, verified procedure, site/side was  marked, verified correct patient position, special equipment/implants available, medications/allergies/relevent history reviewed, required imaging and test results available.  Performed Maximum sterile technique was used including antiseptics, cap, gloves, gown, hand hygiene, mask and sheet. Skin prep: Chlorhexidine; local anesthetic administered A antimicrobial bonded/coated triple lumen catheter was placed in the left internal jugular vein using the Seldinger technique. Catheter placed to 15 cm. Blood aspirated via all 3 ports and then flushed x 3. Line sutured x 2 and dressing applied. Ultrasound guidance used.Yes.  Evaluation Blood flow good Complications: No apparent complications Patient did tolerate procedure well. Chest X-ray ordered to verify placement.  CXR: pending. Harlow Ohms, DO PGY-2 IMTS   DG Knee 1-2 Views Left  Result Date: 11/23/2020 CLINICAL DATA:  Pain. EXAM: LEFT KNEE - 1-2 VIEW COMPARISON:  None. FINDINGS: No evidence of fracture, dislocation. Small joint effusion. 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 No aggressive appearing focal bone abnormality. Vascular calcifications. Diffuse mild subcutaneus soft tissue edema. IMPRESSION: 1. 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 2. No acute displaced fracture or dislocation of the left knee on two-view radiograph. Electronically Signed   By: Iven Finn M.D.   On: 11/23/2020 22:17   DG Knee 1-2 Views Right  Result Date: 11/23/2020 CLINICAL DATA:  Pain. EXAM: RIGHT KNEE - 1-2 VIEW COMPARISON:  None. FINDINGS: No evidence of fracture, dislocation. Likely small joint effusion. Severe tricompartmental degenerative changes of the right knee. No aggressive appearing focal bone abnormality. Severe vascular calcifications. Soft tissues are unremarkable. IMPRESSION: 1. Severe tricompartmental degenerative changes of the right knee. 2. Likely small joint effusion  with limited evaluation due to positioning. 3. No acute displaced fracture or dislocation of the right knee on two-view radiograph. Electronically Signed   By: Iven Finn M.D.   On: 11/23/2020 22:19   IR Sinus/Fist Tube Chk-Non GI  Result Date: 11/27/2020 INDICATION: 65 year old male with a history of leakage at the skin of a percutaneous cholecystostomy. He presents for drain injection EXAM: IMAGE GUIDED DRAIN INJECTION MEDICATIONS: None ANESTHESIA/SEDATION: None COMPLICATIONS: None PROCEDURE: Informed written consent was obtained from the patient after a thorough discussion of the procedural risks, benefits and alternatives. All questions were addressed. Maximal Sterile Barrier Technique was utilized including caps, mask, sterile gowns, sterile gloves, sterile drape, hand hygiene and skin antiseptic. A timeout was performed prior to the initiation of the procedure. Patient was positioned supine on the image intensifier table. The drain was prepped and draped in the usual sterile fashion. Scout images were acquired. Gentle contrast injection of the drain revealed and intraperitoneal location of the pigtail drainage catheter. The catheter was ligated and a wire was passed into the space. Kumpe the catheter was advanced on the wire. The Kumpe the catheter was injected with some gentle manipulation in attempt to find an existing tract into the gallbladder. Ultimately the catheter was removed and a sterile bandage was placed at the former access site. No drainage catheter at the completion of the study. No blood loss. No complications. IMPRESSION: Status post drain injection of a former percutaneous cholecystostomy revealing that the drain has been inadvertently withdrawn and removed from the lumen of the gallbladder. Given the recent placement, there is no mature tract for a replacement at this time. Drain was  completely removed. Signed, Dulcy Fanny. Dellia Nims, RPVI Vascular and Interventional Radiology  Specialists [facility] Radiology Electronically Signed   By: Corrie Mckusick D.O.   On: 11/27/2020 08:31   IR Fluoro Guide CV Line Right  Result Date: 11/19/2020 INDICATION: 65 year old male with history of acute kidney injury requiring long-term hemodialysis. EXAM: TUNNELED CENTRAL VENOUS HEMODIALYSIS CATHETER PLACEMENT WITH ULTRASOUND AND FLUOROSCOPIC GUIDANCE MEDICATIONS: Vancomycin 1 gm IV . The antibiotic was given in an appropriate time interval prior to skin puncture. ANESTHESIA/SEDATION: Moderate (conscious) sedation was employed during this procedure. A total of Versed 1 mg and Fentanyl 100 mcg was administered intravenously. Moderate Sedation Time: 13 minutes. The patient's level of consciousness and vital signs were monitored continuously by radiology nursing throughout the procedure under my direct supervision. FLUOROSCOPY TIME:  0 minutes 12 seconds (6 mGy). COMPLICATIONS: None immediate. PROCEDURE: Informed written consent was obtained from the patient after a discussion of the risks, benefits, and alternatives to treatment. Questions regarding the procedure were encouraged and answered. The right neck and chest were prepped with chlorhexidine in a sterile fashion, and a sterile drape was applied covering the operative field. Maximum barrier sterile technique with sterile gowns and gloves were used for the procedure. A timeout was performed prior to the initiation of the procedure. After creating a small venotomy incision, a 21 gauge micropuncture kit was utilized to access the internal jugular vein. Real-time ultrasound guidance was utilized for vascular access including the acquisition of a permanent ultrasound image documenting patency of the accessed vessel. A Rosen wire was advanced to the level of the IVC and the micropuncture sheath was exchanged for an 8 Fr dilator. A 14.5 French tunneled hemodialysis catheter measuring 23 cm from tip to cuff was tunneled in a retrograde fashion from the  anterior chest wall to the venotomy incision. Serial dilation was then performed an a peel-away sheath was placed. The catheter was then placed through the peel-away sheath with the catheter tip ultimately positioned within the right atrium. Final catheter positioning was confirmed and documented with a spot radiographic image. The catheter aspirates and flushes normally. The catheter was flushed with appropriate volume heparin dwells. The catheter exit site was secured with a 0-Silk retention suture. The venotomy incision was closed with Dermabond. Sterile dressings were applied. The patient tolerated the procedure well without immediate post procedural complication. IMPRESSION: Successful placement of 23 cm tip to cuff tunneled hemodialysis catheter via the right internal jugular vein with catheter tip terminating within the right atrium. The catheter is ready for immediate use. Ruthann Cancer, MD Vascular and Interventional Radiology Specialists [facility] Radiology Electronically Signed   By: Ruthann Cancer MD   On: 11/19/2020 11:06   IR Perc Cholecystostomy  Result Date: 11/09/2020 INDICATION: Acute calculus cholecystitis EXAM: ULTRASOUND FLUOROSCOPIC PERCUTANEOUS TRANSHEPATIC CHOLECYSTOSTOMY MEDICATIONS: Patient is already receiving IV antibiotics as an inpatient; The antibiotic was administered within an appropriate time frame prior to the initiation of the procedure. ANESTHESIA/SEDATION: Moderate (conscious) sedation was employed during this procedure. A total of Versed 1.0 mg and Fentanyl 50 mcg was administered intravenously. Moderate Sedation Time: 10 minutes. The patient's level of consciousness and vital signs were monitored continuously by radiology nursing throughout the procedure under my direct supervision. FLUOROSCOPY TIME:  Fluoroscopy Time: 0 minutes 30 seconds (6 mGy). COMPLICATIONS: None immediate. PROCEDURE: Informed written consent was obtained from the patient after a thorough discussion of  the procedural risks, benefits and alternatives. All questions were addressed. Maximal Sterile Barrier Technique was utilized including caps,  mask, sterile gowns, sterile gloves, sterile drape, hand hygiene and skin antiseptic. A timeout was performed prior to the initiation of the procedure. Previous imaging reviewed. Preliminary ultrasound performed in the right upper quadrant. The thick-walled edematous gallbladder was localized and marked for a transhepatic approach below the right subcostal margin. Under sterile conditions and local anesthesia, a 21 gauge needle was advanced percutaneously via transhepatic approach into the gallbladder. Needle position confirmed with ultrasound. Images obtained for documentation. Bile sample obtained and sent for culture. Guidewire inserted followed by Accustick dilator set. Amplatz guidewire inserted followed by tract dilatation insert a 10 French drain. Drain catheter position confirmed with ultrasound and fluoroscopy. Images obtained for documentation. Catheter secured with a Prolene suture and connected to external gravity drainage bag. Sterile dressing applied. No immediate complication. Patient tolerated the procedure well. IMPRESSION: Successful ultrasound and fluoroscopic 10 French percutaneous transhepatic cholecystostomy. Electronically Signed   By: Jerilynn Mages.  Shick M.D.   On: 11/09/2020 13:17   IR US Guide Vasc Access Right  Result Date: 11/19/2020 INDICATION: 65 year old male with history of acute kidney injury requiring long-term hemodialysis. EXAM: TUNNELED CENTRAL VENOUS HEMODIALYSIS CATHETER PLACEMENT WITH ULTRASOUND AND FLUOROSCOPIC GUIDANCE MEDICATIONS: Vancomycin 1 gm IV . The antibiotic was given in an appropriate time interval prior to skin puncture. ANESTHESIA/SEDATION: Moderate (conscious) sedation was employed during this procedure. A total of Versed 1 mg and Fentanyl 100 mcg was administered intravenously. Moderate Sedation Time: 13 minutes. The patient's  level of consciousness and vital signs were monitored continuously by radiology nursing throughout the procedure under my direct supervision. FLUOROSCOPY TIME:  0 minutes 12 seconds (6 mGy). COMPLICATIONS: None immediate. PROCEDURE: Informed written consent was obtained from the patient after a discussion of the risks, benefits, and alternatives to treatment. Questions regarding the procedure were encouraged and answered. The right neck and chest were prepped with chlorhexidine in a sterile fashion, and a sterile drape was applied covering the operative field. Maximum barrier sterile technique with sterile gowns and gloves were used for the procedure. A timeout was performed prior to the initiation of the procedure. After creating a small venotomy incision, a 21 gauge micropuncture kit was utilized to access the internal jugular vein. Real-time ultrasound guidance was utilized for vascular access including the acquisition of a permanent ultrasound image documenting patency of the accessed vessel. A Rosen wire was advanced to the level of the IVC and the micropuncture sheath was exchanged for an 8 Fr dilator. A 14.5 French tunneled hemodialysis catheter measuring 23 cm from tip to cuff was tunneled in a retrograde fashion from the anterior chest wall to the venotomy incision. Serial dilation was then performed an a peel-away sheath was placed. The catheter was then placed through the peel-away sheath with the catheter tip ultimately positioned within the right atrium. Final catheter positioning was confirmed and documented with a spot radiographic image. The catheter aspirates and flushes normally. The catheter was flushed with appropriate volume heparin dwells. The catheter exit site was secured with a 0-Silk retention suture. The venotomy incision was closed with Dermabond. Sterile dressings were applied. The patient tolerated the procedure well without immediate post procedural complication. IMPRESSION: Successful  placement of 23 cm tip to cuff tunneled hemodialysis catheter via the right internal jugular vein with catheter tip terminating within the right atrium. The catheter is ready for immediate use. Ruthann Cancer, MD [facility] Electronically Signed   By: Ruthann Cancer MD   On: 11/19/2020 11:06   DG CHEST  PORT 1 VIEW  Result Date: 11/12/2020 CLINICAL DATA:  Central line placement EXAM: PORTABLE CHEST 1 VIEW COMPARISON:  Portable exam 1412 hours compared to 11/08/2020 FINDINGS: Indwelling RIGHT jugular central venous catheter with tip projecting over cavoatrial junction. New LEFT subclavian central venous catheter with tip projecting over LEFT brachiocephalic vein. Enlargement of cardiac silhouette with pulmonary vascular congestion. Atherosclerotic calcification aorta. Bibasilar atelectasis and question minimal perihilar edema. No definite pleural effusion or pneumothorax. IMPRESSION: Enlargement of cardiac silhouette with pulmonary vascular congestion and question minimal perihilar edema. Bibasilar atelectasis. No pneumothorax following LEFT jugular line placement. Electronically Signed   By: Lavonia Dana M.D.   On: 11/12/2020 14:42   DG CHEST PORT 1 VIEW  Result Date: 11/08/2020 CLINICAL DATA:  Central line placement EXAM: PORTABLE CHEST 1 VIEW COMPARISON:  None. FINDINGS: Right IJ approach central venous catheter tip terminates near the superior cavoatrial junction. Notable cardiomegaly though may be somewhat accentuated by portable technique and low volumes. Calcified aorta. Low lung volumes and atelectatic changes including more bandlike areas of subsegmental atelectasis towards the bases. Additional hazy and patchy opacities could reflect further atelectatic volume loss though early airspace disease is difficult to exclude. Suspect at least small bilateral effusions. No pneumothorax. No acute osseous or soft tissue abnormality. Telemetry leads  overlie the chest. IMPRESSION: Right IJ approach central venous catheter tip terminates at the superior cavoatrial junction. Low volumes and atelectasis including more bandlike subsegmental atelectasis in the bases. Patchy opacities could reflect further volume loss or early airspace disease. Suspect small bilateral effusions. Enlarged cardiac silhouette though may be accentuated by portable technique. Electronically Signed   By: Lovena Le M.D.   On: 11/08/2020 16:36   DG Abd Portable 1V  Result Date: 11/14/2020 CLINICAL DATA:  Feeding tube placement. EXAM: PORTABLE ABDOMEN - 1 VIEW COMPARISON:  Same day. FINDINGS: Distal tip of feeding tube is seen in expected position of proximal jejunum in patient status post gastrojejunostomy. Percutaneous cholecystostomy is again noted. IMPRESSION: Distal tip of feeding tube seen in expected position of proximal jejunum. Electronically Signed   By: Marijo Conception M.D.   On: 11/14/2020 15:58   DG Abd Portable 1V  Result Date: 11/14/2020 CLINICAL DATA:  Feeding tube placement, history of gastric bypass EXAM: PORTABLE ABDOMEN - 1 VIEW COMPARISON:  CT abdomen/pelvis dated 11/09/2020 FINDINGS: Weighted feeding tube in the left upper abdomen adjacent to surgical clips. It is unclear radiographically whether this is within the gastric pouch or within the jejunum distal to the gastrojejunostomy, although the latter is favored when correlating with prior CT. Pigtail drainage catheter in the right upper abdomen, related to percutaneous cholecystostomy. Degenerative changes of the lumbar spine. IMPRESSION: Weighted feeding tube in the left upper abdomen, likely within the jejunum distal to the gastrojejunostomy, less likely within the gastric pouch. Status post percutaneous cholecystostomy. Electronically Signed   By: Julian Hy M.D.   On: 11/14/2020 13:15   DG Abd Portable 1V  Result Date: 11/08/2020 CLINICAL DATA:  Check gastric catheter placement EXAM: PORTABLE  ABDOMEN - 1 VIEW COMPARISON:  Film from earlier in the same day. FINDINGS: Gastric catheter is noted within the stomach. Proximal side port however is noted in the distal esophagus. This should be advanced several cm deeper into the stomach. Multiple dilated loops of small bowel are again identified consistent with at least partial small bowel obstruction. IMPRESSION: Persistent small bowel dilatation. Gastric catheter as described. This should be advanced deeper into the stomach. Electronically Signed   By:  Inez Catalina M.D.   On: 11/08/2020 21:53   DG Abd Portable 1V  Result Date: 11/08/2020 CLINICAL DATA:  Abdominal pain and distention. EXAM: PORTABLE ABDOMEN - 1 VIEW COMPARISON:  No prior. FINDINGS: Surgical clips right upper quadrant and pelvis. Multiple dilated loops of small bowel noted. Relative paucity of colonic gas. Findings suggest small bowel obstruction. Follow-up abdominal series suggested for further evaluation. It is difficult to evaluate for free air as hemidiaphragms incompletely imaged. IVC filter noted. Degenerative change lumbar spine. IMPRESSION: Multiple dilated loops of small bowel noted. Relative paucity of colonic gas. Findings suggest small bowel obstruction. Follow-up abdominal series suggested for further evaluation. Electronically Signed   By: Marcello Moores  Register   On: 11/08/2020 16:35   ECHOCARDIOGRAM COMPLETE  Result Date: 11/30/2020    ECHOCARDIOGRAM REPORT   Patient Name:   NAJEE MANNINEN Date of Exam: 11/30/2020 Medical Rec #:  [id number]     Height:       70.0 in Accession #:    [id number]    Weight:       264.8 lb Date of Birth:  20-May-1956     BSA:          2.352 m Patient Age:    2 years      BP:           131/67 mmHg Patient Gender: M             HR:           84 bpm. Exam Location:  Inpatient Procedure: 2D Echo, Color Doppler, Cardiac Doppler and Intracardiac            Opacification Agent Indications:    I48.91* Unspecified atrial fibrillation  History:        Patient  has no prior history of Echocardiogram examinations.                 Arrythmias:Atrial Fibrillation; Risk Factors:Hypertension and                 Diabetes.  Sonographer:    Raquel Sarna Senior RDCS Referring Phys: [id number] ALLISON L ELLIS  Sonographer Comments: Poor echo windows due to lung interference. IMPRESSIONS  1. Left ventricular ejection fraction, by estimation, is 65 to 70%. The left ventricle has normal function. The left ventricle has no regional wall motion abnormalities. Left ventricular diastolic function could not be evaluated.  2. Right ventricular systolic function was not well visualized. The right ventricular size is normal. Tricuspid regurgitation signal is inadequate for assessing PA pressure.  3. The mitral valve is normal in structure. No evidence of mitral valve regurgitation. No evidence of mitral stenosis.  4. The aortic valve is normal in structure. Aortic valve regurgitation is not visualized. No aortic stenosis is present. FINDINGS  Left Ventricle: Left ventricular ejection fraction, by estimation, is 65 to 70%. The left ventricle has normal function. The left ventricle has no regional wall motion abnormalities. Definity contrast agent was given IV to delineate the left ventricular  endocardial borders. The left ventricular internal cavity size was normal in size. There is no left ventricular hypertrophy. Left ventricular diastolic function could not be evaluated due to atrial fibrillation. Left ventricular diastolic function could  not be evaluated. Normal left ventricular filling pressure. Right Ventricle: The right ventricular size is normal. No increase in right ventricular wall thickness. Right ventricular systolic function was not well visualized. Tricuspid regurgitation signal is inadequate for assessing PA pressure. Left Atrium: Left atrial size was not well  visualized. Right Atrium: Right atrial size was not well visualized. Pericardium: There is no evidence of pericardial effusion.  Mitral Valve: The mitral valve is normal in structure. No evidence of mitral valve regurgitation. No evidence of mitral valve stenosis. Tricuspid Valve: The tricuspid valve is normal in structure. Tricuspid valve regurgitation is not demonstrated. No evidence of tricuspid stenosis. Aortic Valve: The aortic valve is normal in structure. Aortic valve regurgitation is not visualized. No aortic stenosis is present. Pulmonic Valve: The pulmonic valve was not well visualized. Pulmonic valve regurgitation is not visualized. No evidence of pulmonic stenosis. Aorta: The aortic root is normal in size and structure. Venous: The inferior vena cava was not well visualized. IAS/Shunts: No atrial level shunt detected by color flow Doppler.  LEFT VENTRICLE PLAX 2D LVIDd:         3.60 cm  Diastology LVIDs:         2.50 cm  LV e' medial:    12.70 cm/s LV PW:         0.90 cm  LV E/e' medial:  8.7 LV IVS:        0.80 cm  LV e' lateral:   12.00 cm/s LVOT diam:     2.20 cm  LV E/e' lateral: 9.3 LV SV:         68 LV SV Index:   29 LVOT Area:     3.80 cm  RIGHT VENTRICLE RV S prime:     12.90 cm/s TAPSE (M-mode): 2.2 cm LEFT ATRIUM              Index       RIGHT ATRIUM           Index LA diam:        4.30 cm  1.83 cm/m  RA Area:     24.50 cm LA Vol (A2C):   107.0 ml 45.50 ml/m RA Volume:   70.90 ml  30.15 ml/m LA Vol (A4C):   97.0 ml  41.25 ml/m LA Biplane Vol: 103.0 ml 43.80 ml/m  AORTIC VALVE LVOT Vmax:   118.00 cm/s LVOT Vmean:  75.100 cm/s LVOT VTI:    0.178 m  AORTA Ao Root diam: 3.60 cm MITRAL VALVE MV Area (PHT): 2.85 cm     SHUNTS MV Decel Time: 266 msec     Systemic VTI:  0.18 m MV E velocity: 111.00 cm/s  Systemic Diam: 2.20 cm MV A velocity: 44.80 cm/s MV E/A ratio:  2.48 Fransico Him MD Electronically signed by Fransico Him MD Signature Date/Time: 11/30/2020/1:33:03 PM    Final    [place] RT LOWER EXTREM LTD SOFT TISSUE NON VASCULAR  Result Date: 11/23/2020 CLINICAL DATA:  Bilateral knee pain x5 months with possible joint  effusion. EXAM: ULTRASOUND BILATERAL LOWER EXTREMITY LIMITED TECHNIQUE: Ultrasound examination of the RIGHT and LEFT knees was performed in the area of clinical concern. COMPARISON:  None. FINDINGS: Joint Space: A 7.2 cm x 6.9 cm x 2.0 cm area of complex hypoechogenicity is seen along the anterior aspect of the right knee. A similar appearing 5.7 cm x 2.0 cm x 5.6 cm area of complex hypoechogenicity is seen along the anterior aspect of the left knee. No abnormal flow is noted within these regions on color Doppler evaluation. Muscles: Normal. Tendons: Normal Other Soft Tissue Structures: Diffusely edematous soft tissues are noted bilaterally. IMPRESSION: 1. Bilateral cellulitis with additional findings that may represent large, bilateral complex knee effusions. MRI correlation is recommended, as soft tissue abscesses cannot be  excluded. Electronically Signed   By: Virgina Norfolk M.D.   On: 11/23/2020 16:55   [place] LT LOWER EXTREM LTD SOFT TISSUE NON VASCULAR  Result Date: 11/23/2020 CLINICAL DATA:  Bilateral knee pain with possible joint effusions. EXAM: ULTRASOUND BILATERAL LOWER EXTREMITY LIMITED TECHNIQUE: Ultrasound examination of the RIGHT and LEFT knees was performed in the area of clinical concern. COMPARISON:  None. FINDINGS: Joint Space: A 7.2 cm x 6.9 cm x 2.0 cm area of complex hypoechogenicity is seen along the anterior aspect of the right knee. A similar appearing 5.7 cm x 2.0 cm x 5.6 cm area of complex hypoechogenicity is seen along the anterior aspect of the left knee. No abnormal flow is noted within these regions on color Doppler evaluation. Muscles: Normal. Tendons: Normal Other Soft Tissue Structures: Diffusely edematous soft tissues are noted bilaterally. IMPRESSION: 1. Bilateral cellulitis with additional findings that may represent large, bilateral complex knee effusions. MRI correlation is recommended, as soft tissue abscesses cannot be excluded. Electronically Signed   By: Virgina Norfolk M.D.   On: 11/23/2020 16:56   VAS [place] LOWER EXTREMITY VENOUS (DVT)  Result Date: 11/12/2020  Lower Venous DVT Study Patient Name:  XYLON CROOM  Date of Exam:   11/12/2020 Medical Rec #: [id number]      Accession #:    [id number] Date of Birth: [birth year]      Patient Gender: M Patient Age:   65Y Exam Location:  [facility] Procedure:      VAS [place] LOWER EXTREMITY VENOUS (DVT) Referring Phys: Dennis Acres --------------------------------------------------------------------------------  Indications: Swelling, and Pain.  Risk Factors: None identified. Limitations: Body habitus, poor ultrasound/tissue interface and patient movement, patient pain tolerance, patient guarding. Comparison Study: No prior studies. Performing Technologist: Oliver Hum RVT  Examination Guidelines: A complete evaluation includes B-mode imaging, spectral Doppler, color Doppler, and power Doppler as needed of all accessible portions of each vessel. Bilateral testing is considered an integral part of a complete examination. Limited examinations for reoccurring indications may be performed as noted. The reflux portion of the exam is performed with the patient in reverse Trendelenburg.  +---------+---------------+---------+-----------+----------+-------------------+ RIGHT    CompressibilityPhasicitySpontaneityPropertiesThrombus Aging      +---------+---------------+---------+-----------+----------+-------------------+ CFV      Full           Yes      Yes                                      +---------+---------------+---------+-----------+----------+-------------------+ SFJ      Full                                                             +---------+---------------+---------+-----------+----------+-------------------+ FV Prox  Full                                                             +---------+---------------+---------+-----------+----------+-------------------+ FV Mid                   Yes  Yes                                      +---------+---------------+---------+-----------+----------+-------------------+ FV Distal               Yes      Yes                                      +---------+---------------+---------+-----------+----------+-------------------+ PFV                                                   Not well visualized +---------+---------------+---------+-----------+----------+-------------------+ POP                     Yes      Yes                                      +---------+---------------+---------+-----------+----------+-------------------+ PTV      Full                                                             +---------+---------------+---------+-----------+----------+-------------------+ PERO     Full                                                             +---------+---------------+---------+-----------+----------+-------------------+   +----+---------------+---------+-----------+----------+--------------+ LEFTCompressibilityPhasicitySpontaneityPropertiesThrombus Aging +----+---------------+---------+-----------+----------+--------------+ CFV Full           Yes      Yes                                 +----+---------------+---------+-----------+----------+--------------+    Summary: RIGHT: - There is no evidence of deep vein thrombosis in the lower extremity. However, portions of this examination were limited- see technologist comments above.  - No cystic structure found in the popliteal fossa.  LEFT: - No evidence of common femoral vein obstruction.  *See table(s) above for measurements and observations. Electronically signed by Monica Martinez MD on 11/12/2020 at 5:17:16 PM.    Final    VAS [place] UPPER EXTREMITY VENOUS DUPLEX  Result Date: 11/21/2020 UPPER VENOUS STUDY  Patient Name:  WALTHER SANAGUSTIN  Date of Exam:   11/21/2020 Medical Rec #: [id number]      Accession #:    [id number] Date of Birth:  [birth year]      Patient Gender: M Patient Age:   65Y [facility]am [facility] Procedure:      VAS [place] UPPER EXTREMITY VENOUS DUPLEX Referring Phys: [id number] RALPH A NETTEY --------------------------------------------------------------------------------  Indications: Pain, and Swelling Comparison Study: none Performing Technologist: June Leap RDMS, RVT  Examination Guidelines: A complete evaluation includes B-mode imaging, spectral Doppler, color  Doppler, and power Doppler as needed of all accessible portions of each vessel. Bilateral testing is considered an integral part of a complete examination. Limited examinations for reoccurring indications may be performed as noted.  Right Findings: +----------+------------+---------+-----------+----------+-------+ RIGHT     CompressiblePhasicitySpontaneousPropertiesSummary +----------+------------+---------+-----------+----------+-------+ IJV           Full       Yes       Yes                      +----------+------------+---------+-----------+----------+-------+ Subclavian    Full       Yes       Yes                      +----------+------------+---------+-----------+----------+-------+ Axillary      Full       Yes       Yes                      +----------+------------+---------+-----------+----------+-------+ Brachial      Full                                          +----------+------------+---------+-----------+----------+-------+ Radial        Full                                          +----------+------------+---------+-----------+----------+-------+ Ulnar         Full                                          +----------+------------+---------+-----------+----------+-------+ Cephalic      Full                                          +----------+------------+---------+-----------+----------+-------+ Basilic       Full                                           +----------+------------+---------+-----------+----------+-------+  Left Findings: +----------+------------+---------+-----------+----------+-------+ LEFT      CompressiblePhasicitySpontaneousPropertiesSummary +----------+------------+---------+-----------+----------+-------+ Subclavian               Yes       Yes                      +----------+------------+---------+-----------+----------+-------+  Summary:  Right: No evidence of deep vein thrombosis in the upper extremity. No evidence of superficial vein thrombosis in the upper extremity.  Left: No evidence of thrombosis in the subclavian.  *See table(s) above for measurements and observations.  Diagnosing physician: Harold Barban MD Electronically signed by Harold Barban MD on 11/21/2020 at 10:15:03 PM.    Final    US Abdomen Limited RUQ (LIVER/GB)  Result Date: 11/08/2020 CLINICAL DATA:  Right upper quadrant abdominal pain EXAM: ULTRASOUND ABDOMEN LIMITED RIGHT UPPER QUADRANT COMPARISON:  None. FINDINGS: Gallbladder: The gallbladder is mildly distended and contains numerous layering gravel-like gallstones. There is mild pericholecystic fluid identified along the hepatics  surface as well as mild gallbladder wall thickening noted. The sonographic Percell Miller sign is reportedly positive. Altogether, the findings are in keeping with changes of acute calculus cholecystitis. Common bile duct: Diameter: 4 mm Liver: No focal lesion identified. Within normal limits in parenchymal echogenicity. Portal vein is patent on color Doppler imaging with normal direction of blood flow towards the liver. Other: No ascites IMPRESSION: Acute calculus cholecystitis. No intra or extrahepatic biliary ductal dilation. Electronically Signed   By: Fidela Salisbury MD   On: 11/08/2020 22:20    Subjective: Feels stronger after 2u PRBCs. Stools regulating. Taking po adequately. Had urine output this morning but still severely swollen. Ready to go to [facility].  Discharge  Exam: Vitals:   12/01/20 2153 12/02/20 0555  BP: 129/72 123/65  Pulse: 81 88  Resp: 16 16  Temp: 99.9 F (37.7 C) 98.8 F (37.1 C)  SpO2: 95% 96%   General: Pt is alert, awake, not in acute distress Cardiovascular: RRR, S1/S2 +, no rubs, no gallops Respiratory: CTA bilaterally, no wheezing, no rhonchi Abdominal: Soft, NT, ND, bowel sounds hyperactive Extremities: Diffuse edema improved from prior exams still extending to abdominal wall.   Labs:  Basic Metabolic Panel: Recent Labs  Lab 11/27/20 0659 11/29/20 0052 12/01/20 1030 12/02/20 0508  NA 131* 133* 133* 134*  K 4.1 3.4* 2.7* 2.8*  CL 97* 96* 96* 97*  CO2 23 26 27 27   GLUCOSE 187* 180* 145* 125*  BUN 97* 65* 58* 29*  CREATININE 6.38* 5.30* 5.49* 3.55*  CALCIUM 8.1* 8.2* 8.3* 8.1*  MG  --   --   --  2.1  PHOS 6.3* 4.6  --   --    Liver Function Tests: Recent Labs  Lab 11/27/20 0659 11/29/20 0052 12/01/20 1030 12/02/20 0508  AST  --   --  52* 43*  ALT  --   --  56* 51*  ALKPHOS  --   --  342* 338*  BILITOT  --   --  1.2 1.2  PROT  --   --  5.4* 6.0*  ALBUMIN 1.9* 2.1* 2.0* 2.3*   CBC: Recent Labs  Lab 11/27/20 0659 11/28/20 0222 11/29/20 0052 12/01/20 1030  WBC 10.6* 9.2 10.6* 9.5  HGB 7.5* 7.4* 7.7* 6.7*  HCT 23.9* 24.5* 24.5* 21.4*  MCV 86.6 88.1 88.1 90.3  PLT 491* 465* 454* 407*   CBG: Recent Labs  Lab 11/30/20 2127 12/01/20 0824 12/01/20 1207 12/01/20 2151 12/02/20 0806  GLUCAP 158* 169* 163* 130* 115*   Microbiology Recent Results (from the past 240 hour(s))  Body fluid culture w Gram Stain     Status: None   Collection Time: 11/23/20  7:31 PM   Specimen: Body Fluid  Result Value Ref Range Status   Specimen Description FLUID SYNOVIAL RIGHT KNEE  Final   Special Requests Immunocompromised  Final   Gram Stain   Final    RARE WBC PRESENT, PREDOMINANTLY MONONUCLEAR NO ORGANISMS SEEN    Culture   Final    NO GROWTH 3 DAYS Performed at [facility], [address],  [place], [place] [address]    Report Status 11/27/2020 FINAL  Final  Body fluid culture w Gram Stain     Status: None   Collection Time: 11/23/20  7:32 PM   Specimen: Body Fluid  Result Value Ref Range Status   Specimen Description FLUID SYNOVIAL LEFT KNEE  Final   Special Requests Immunocompromised  Final   Gram Stain  Final    FEW WBC PRESENT,BOTH PMN AND MONONUCLEAR RARE GRAM POSITIVE COCCI    Culture   Final    NO GROWTH 3 DAYS Performed at [facility], [place] [address] [place], [place] [address]    Report Status 11/27/2020 FINAL  Final  Aerobic/Anaerobic Culture w Gram Stain (surgical/deep wound)     Status: None   Collection Time: 11/24/20 12:34 PM   Specimen: PATH Cytology Misc. fluid; Body Fluid  Result Value Ref Range Status   Specimen Description FLUID  Final   Special Requests RIGHT KNEE SYNOVIAL FLUID SPEC A  Final   Gram Stain   Final    FEW WBC PRESENT,BOTH PMN AND MONONUCLEAR NO ORGANISMS SEEN    Culture   Final    No growth aerobically or anaerobically. Performed at [facility], [place] [address] [place], [place] [address]    Report Status 11/29/2020 FINAL  Final  Aerobic/Anaerobic Culture w Gram Stain (surgical/deep wound)     Status: None   Collection Time: 11/24/20 12:36 PM   Specimen: Fluid  Result Value Ref Range Status   Specimen Description FLUID  Final   Special Requests LEFT KNEE SYNOVIAL SPEC B  Final   Gram Stain   Final    MODERATE WBC PRESENT,BOTH PMN AND MONONUCLEAR NO ORGANISMS SEEN    Culture   Final    No growth aerobically or anaerobically. Performed at [facility], [place] [address] [place], [place] [address]    Report Status 11/29/2020 FINAL  Final  Culture, blood (Routine X 2) w Reflex to ID Panel     Status: None   Collection Time: 11/24/20 10:03 PM   Specimen: BLOOD  Result Value Ref Range Status   Specimen Description BLOOD SITE NOT SPECIFIED  Final   Special Requests   Final    BOTTLES DRAWN AEROBIC ONLY Blood  Culture results may not be optimal due to an inadequate volume of blood received in culture bottles   Culture   Final    NO GROWTH 5 DAYS Performed at [facility], [place] [address] [place], [place] [address]    Report Status 11/30/2020 FINAL  Final  Culture, blood (Routine X 2) w Reflex to ID Panel     Status: None   Collection Time: 11/24/20 10:03 PM   Specimen: BLOOD  Result Value Ref Range Status   Specimen Description BLOOD SITE NOT SPECIFIED  Final   Special Requests   Final    BOTTLES DRAWN AEROBIC AND ANAEROBIC Blood Culture results may not be optimal due to an inadequate volume of blood received in culture bottles   Culture   Final    NO GROWTH 5 DAYS Performed at [facility], [place] [address], [place],  [address]    Report Status 11/30/2020 FINAL  Final    Time coordinating discharge: Approximately 40 minutes  Patrecia Pour, MD  [facility] 12/02/2020, 9:16 AM

## 2020-12-02 NOTE — Progress Notes (Signed)
Denyse Amass to be D/C'd  per MD order.  Discussed with the patient and all questions fully answered.  VSS, Skin clean, dry and intact without evidence of skin break down, no evidence of skin tears noted.  IV catheter discontinued intact. Site without signs and symptoms of complications. Dressing and pressure applied.  An After Visit Summary was printed and given to the [place].   D/c education completed with patient/family including follow up instructions, medication list, d/c activities limitations if indicated, with other d/c instructions as indicated by MD - patient able to verbalize understanding, all questions fully answered.   Patient instructed to return to ED, call 911, or call MD for any changes in condition.   Patient to be escorted to [facility] via [facility], report called and given at [phone].

## 2020-12-02 NOTE — Progress Notes (Signed)
Patient ID: Tom Scott, male   DOB: 04-06-1956, 65 y.o.   MRN: [id number] S: Was hypotensive at HD yesterday but improved with blood transfusion. O:BP 123/65 (BP Location: Left Arm)   Pulse 88   Temp 98.8 F (37.1 C) (Oral)   Resp 16   Ht '5\' 10"'$  (1.778 m)   Wt 115.1 kg   SpO2 96%   BMI 36.41 kg/m   Intake/Output Summary (Last 24 hours) at 12/02/2020 1101 Last data filed at 12/02/2020 0804 Gross per 24 hour  Intake 1010 ml  Output 1250 ml  Net -240 ml   Intake/Output: I/O last 3 completed shifts: In: [id number] [P.O.:560; Blood:630] Out: 1000 [Other:1000]  Intake/Output this shift:  Total I/O In: -  Out: 250 [Urine:250] Weight change: -3.4 kg Gen: chronically ill-appearing  CVS:RRR Resp: CTA Abd: benign Ext: 1+ edema B/l lower ext.  Recent Labs  Lab 11/27/20 0659 11/29/20 0052 12/01/20 1030 12/02/20 0508  NA 131* 133* 133* 134*  K 4.1 3.4* 2.7* 2.8*  CL 97* 96* 96* 97*  CO2 '23 26 27 27  '$ GLUCOSE 187* 180* 145* 125*  BUN 97* 65* 58* 29*  CREATININE 6.38* 5.30* 5.49* 3.55*  ALBUMIN 1.9* 2.1* 2.0* 2.3*  CALCIUM 8.1* 8.2* 8.3* 8.1*  PHOS 6.3* 4.6  --   --   AST  --   --  52* 43*  ALT  --   --  56* 51*   Liver Function Tests: Recent Labs  Lab 11/29/20 0052 12/01/20 1030 12/02/20 0508  AST  --  52* 43*  ALT  --  56* 51*  ALKPHOS  --  342* 338*  BILITOT  --  1.2 1.2  PROT  --  5.4* 6.0*  ALBUMIN 2.1* 2.0* 2.3*   No results for input(s): LIPASE, AMYLASE in the last 168 hours. No results for input(s): AMMONIA in the last 168 hours. CBC: Recent Labs  Lab 11/27/20 0659 11/28/20 0222 11/29/20 0052 12/01/20 1030 12/02/20 0508  WBC 10.6* 9.2 10.6* 9.5 9.4  HGB 7.5* 7.4* 7.7* 6.7* 8.9*  HCT 23.9* 24.5* 24.5* 21.4* 27.7*  MCV 86.6 88.1 88.1 90.3 88.8  PLT 491* 465* 454* 407* 368   Cardiac Enzymes: No results for input(s): CKTOTAL, CKMB, CKMBINDEX, TROPONINI in the last 168 hours. CBG: Recent Labs  Lab 11/30/20 2127 12/01/20 0824 12/01/20 1207  12/01/20 2151 12/02/20 0806  GLUCAP 158* 169* 163* 130* 115*    Iron Studies: No results for input(s): IRON, TIBC, TRANSFERRIN, FERRITIN in the last 72 hours. Studies/Results: ECHOCARDIOGRAM COMPLETE  Result Date: 11/30/2020    ECHOCARDIOGRAM REPORT   Patient Name:   DECAMERON HOOD Date of Exam: 11/30/2020 Medical Rec #:  [id number]     Height:       70.0 in Accession #:    [id number]    Weight:       264.8 lb Date of Birth:  11-Jul-1955     BSA:          2.352 m Patient Age:    69 years      BP:           131/67 mmHg Patient Gender: M             HR:           84 bpm. Exam Location:  Inpatient Procedure: 2D Echo, Color Doppler, Cardiac Doppler and Intracardiac            Opacification Agent Indications:    I48.91* Unspecified atrial fibrillation  History:        Patient has no prior history of Echocardiogram examinations.                 Arrythmias:Atrial Fibrillation; Risk Factors:Hypertension and                 Diabetes.  Sonographer:    Raquel Sarna Senior RDCS Referring Phys: [id number] ALLISON L ELLIS  Sonographer Comments: Poor echo windows due to lung interference. IMPRESSIONS  1. Left ventricular ejection fraction, by estimation, is 65 to 70%. The left ventricle has normal function. The left ventricle has no regional wall motion abnormalities. Left ventricular diastolic function could not be evaluated.  2. Right ventricular systolic function was not well visualized. The right ventricular size is normal. Tricuspid regurgitation signal is inadequate for assessing PA pressure.  3. The mitral valve is normal in structure. No evidence of mitral valve regurgitation. No evidence of mitral stenosis.  4. The aortic valve is normal in structure. Aortic valve regurgitation is not visualized. No aortic stenosis is present. FINDINGS  Left Ventricle: Left ventricular ejection fraction, by estimation, is 65 to 70%. The left ventricle has normal function. The left ventricle has no regional wall motion abnormalities. Definity  contrast agent was given IV to delineate the left ventricular  endocardial borders. The left ventricular internal cavity size was normal in size. There is no left ventricular hypertrophy. Left ventricular diastolic function could not be evaluated due to atrial fibrillation. Left ventricular diastolic function could  not be evaluated. Normal left ventricular filling pressure. Right Ventricle: The right ventricular size is normal. No increase in right ventricular wall thickness. Right ventricular systolic function was not well visualized. Tricuspid regurgitation signal is inadequate for assessing PA pressure. Left Atrium: Left atrial size was not well visualized. Right Atrium: Right atrial size was not well visualized. Pericardium: There is no evidence of pericardial effusion. Mitral Valve: The mitral valve is normal in structure. No evidence of mitral valve regurgitation. No evidence of mitral valve stenosis. Tricuspid Valve: The tricuspid valve is normal in structure. Tricuspid valve regurgitation is not demonstrated. No evidence of tricuspid stenosis. Aortic Valve: The aortic valve is normal in structure. Aortic valve regurgitation is not visualized. No aortic stenosis is present. Pulmonic Valve: The pulmonic valve was not well visualized. Pulmonic valve regurgitation is not visualized. No evidence of pulmonic stenosis. Aorta: The aortic root is normal in size and structure. Venous: The inferior vena cava was not well visualized. IAS/Shunts: No atrial level shunt detected by color flow Doppler.  LEFT VENTRICLE PLAX 2D LVIDd:         3.60 cm  Diastology LVIDs:         2.50 cm  LV e' medial:    12.70 cm/s LV PW:         0.90 cm  LV E/e' medial:  8.7 LV IVS:        0.80 cm  LV e' lateral:   12.00 cm/s LVOT diam:     2.20 cm  LV E/e' lateral: 9.3 LV SV:         68 LV SV Index:   29 LVOT Area:     3.80 cm  RIGHT VENTRICLE RV S prime:     12.90 cm/s TAPSE (M-mode): 2.2 cm LEFT ATRIUM              Index       RIGHT  ATRIUM           Index LA diam:  4.30 cm  1.83 cm/m  RA Area:     24.50 cm LA Vol (A2C):   107.0 ml 45.50 ml/m RA Volume:   70.90 ml  30.15 ml/m LA Vol (A4C):   97.0 ml  41.25 ml/m LA Biplane Vol: 103.0 ml 43.80 ml/m  AORTIC VALVE LVOT Vmax:   118.00 cm/s LVOT Vmean:  75.100 cm/s LVOT VTI:    0.178 m  AORTA Ao Root diam: 3.60 cm MITRAL VALVE MV Area (PHT): 2.85 cm     SHUNTS MV Decel Time: 266 msec     Systemic VTI:  0.18 m MV E velocity: 111.00 cm/s  Systemic Diam: 2.20 cm MV A velocity: 44.80 cm/s MV E/A ratio:  2.48 Fransico Him MD Electronically signed by Fransico Him MD Signature Date/Time: 11/30/2020/1:33:03 PM    Final     (feeding supplement) PROSource Plus  30 mL Oral BID BM   sodium chloride   Intravenous Once   amiodarone  200 mg Oral Daily   apixaban  5 mg Oral BID   vitamin C  500 mg Oral BID   calcium carbonate  1 tablet Oral TID   Chlorhexidine Gluconate Cloth  6 each Topical Q0600   cholecalciferol  1,000 Units Oral Daily   collagenase   Topical Daily   darbepoetin (ARANESP) injection - DIALYSIS  40 mcg Intravenous Q Sat-HD   docusate sodium  100 mg Oral BID   feeding supplement (NEPRO CARB STEADY)  237 mL Oral BID BM   insulin aspart  0-6 Units Subcutaneous TID WC   iron polysaccharides  150 mg Oral Daily   lactose free nutrition  237 mL Oral TID WC   lidocaine  1 patch Transdermal Q24H   midodrine  10 mg Oral Q T,Th,Sa-HD   multivitamin with minerals  1 tablet Oral BID   potassium chloride  40 mEq Oral Once   rosuvastatin  10 mg Oral Daily   simethicone  80 mg Oral TID   sodium chloride flush  10-40 mL Intracatheter Q12H   sodium chloride flush  5 mL Intracatheter Q8H   vitamin A  50,000 Units Oral Daily    BMET    Component Value Date/Time   NA 134 (L) 12/02/2020 0508   K 2.8 (L) 12/02/2020 0508   CL 97 (L) 12/02/2020 0508   CO2 27 12/02/2020 0508   GLUCOSE 125 (H) 12/02/2020 0508   BUN 29 (H) 12/02/2020 0508   CREATININE 3.55 (H) 12/02/2020 0508    CALCIUM 8.1 (L) 12/02/2020 0508   GFRNONAA 18 (L) 12/02/2020 0508   CBC    Component Value Date/Time   WBC 9.4 12/02/2020 0508   RBC 3.12 (L) 12/02/2020 0508   HGB 8.9 (L) 12/02/2020 0508   HCT 27.7 (L) 12/02/2020 0508   PLT 368 12/02/2020 0508   MCV 88.8 12/02/2020 0508   MCH 28.5 12/02/2020 0508   MCHC 32.1 12/02/2020 0508   RDW 18.5 (H) 12/02/2020 0508     Assessment/Plan:   Acute kidney Injury: Secondary to septic shock/ATN and likely exacerbated by hemodynamic insult from atrial fibrillation with RVR.  No known baseline Scr but was 1.99 at time of presentation.  Started on hemodialysis on 5/23 and at this time he remains anuric and without any evidence of renal recovery.  Continue HD while monitoring for renal recovery.  Unfortunately no significant increase in UOP Tunneled HD catheter placed 11/19/20 Hypotensive on HD so will add midodrine 10 mg before HD moving forward.  [facility] and will have HD on MWF schedule.  Calculus cholecystitis with E. coli bacteremia: Status post percutaneous cholecystostomy on 5/20 with suspected amiodarone associated elevation of LFTs. Atrial fibrillation with rapid ventricular response: Rate control fair on amiodarone, likely compounded by infection from septic arthritis. Microcytic anemia: On oral iron and status post Aranesp-likely compounded by acute/critical illness.  PRBC transfusion when hemoglobin <7.0. Physical deconditioning: Secondary to acute illness and now limited by bilateral knee pain/swelling from septic arthritis. Septic arthritis: Seen by orthopedic surgery for concerns of knee swelling 2 days ago with aspiration revealing cloudy aspirate in both knees with few GPC from the left knee; consequently underwent arthroscopic debridement of both knees yesterday.  Surgical cultures pending while on intravenous vancomycin. Moderate protein malnutrition:  Presumably due to sepsis. Sacral decubitus ulcer: per notes  unstageable pressure injury to inner gluteal cleft/sacrum 4/1 cm.  Topical treatment orders per wound care consult.  This is preventing him from sitting for prolonged periods of time.  Will need gelfoam seat cushion to improve his tolerance of sitting in a chair.   Deconditioning - pt is too weak for outpatient HD and will benefit from LTC placement with daily PT/OT.   [facility] today.    Donetta Potts, MD [employer] [phone]

## 2020-12-05 DIAGNOSIS — I482 Chronic atrial fibrillation, unspecified: Secondary | ICD-10-CM | POA: Diagnosis not present

## 2021-07-24 DEATH — deceased

## 2022-10-14 IMAGING — CT CT ABD-PELV W/O CM
2 of 4 series · 16 of 46 positions shown, 18 images · non-contrast
Comparison: Abdominal ultrasound November 08, 2020.

CLINICAL DATA: Nausea and vomiting

EXAM:
CT ABDOMEN AND PELVIS WITHOUT CONTRAST
TECHNIQUE: Multidetector CT imaging of the abdomen and pelvis was performed
following the standard protocol without IV contrast.

[Series 3: ap without · axial · non-contrast · 0.98mm/px · z∈[-184,+266]mm · 13 of 102 slices shown, 15 images]
[im 6/102  soft-tissue]
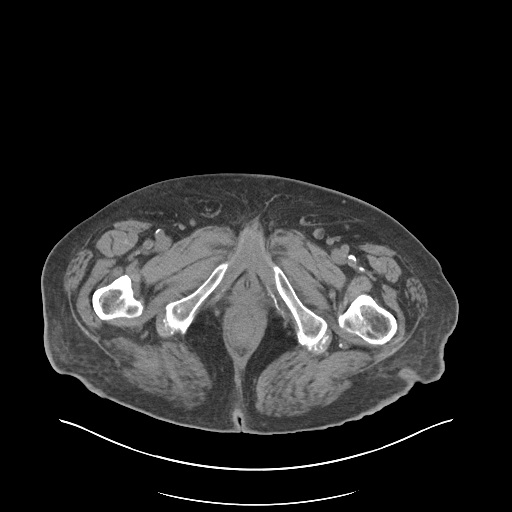
[im 6/102  bone]
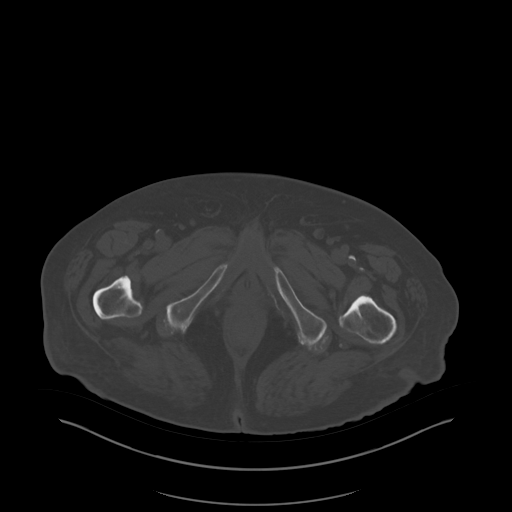
[im 16/102  soft-tissue]
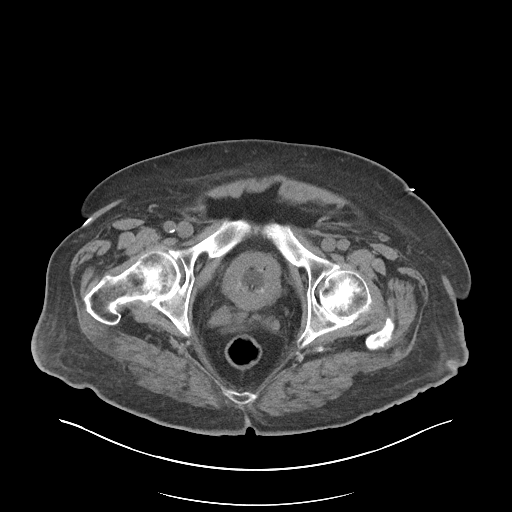
[im 21/102  soft-tissue]
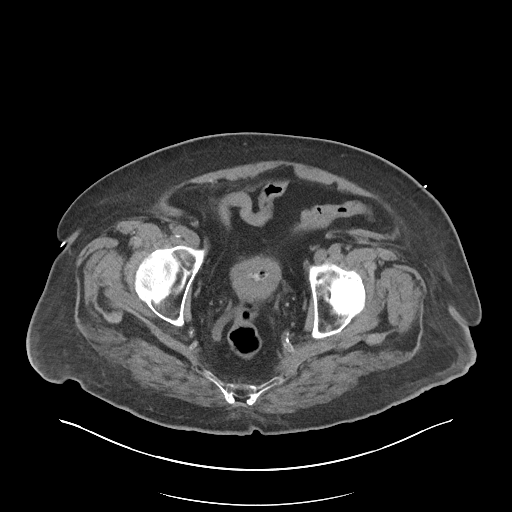
[im 31/102  soft-tissue]
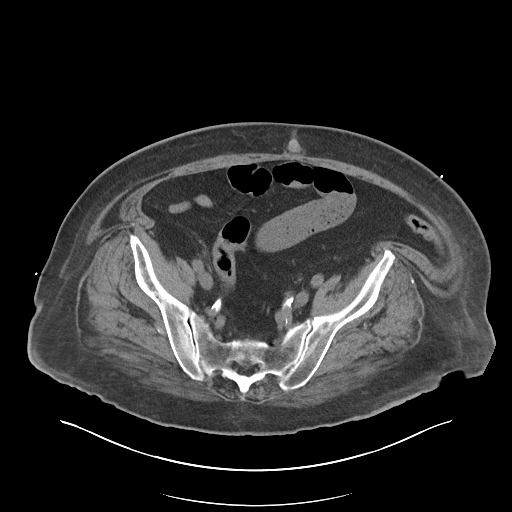
[im 36/102  soft-tissue]
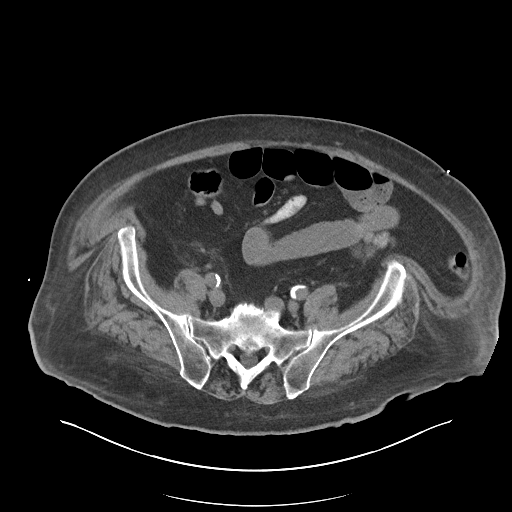
[im 46/102  soft-tissue]
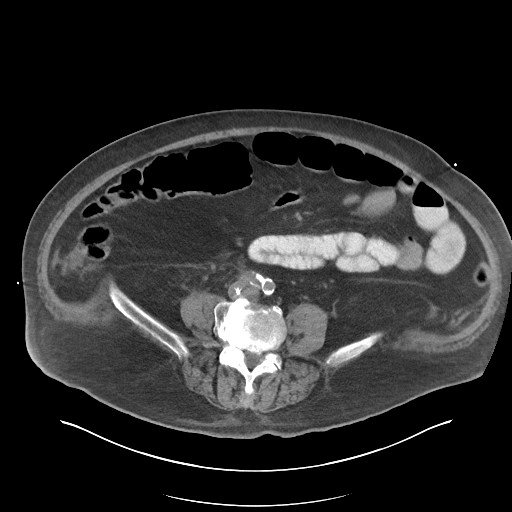
[im 51/102  soft-tissue]
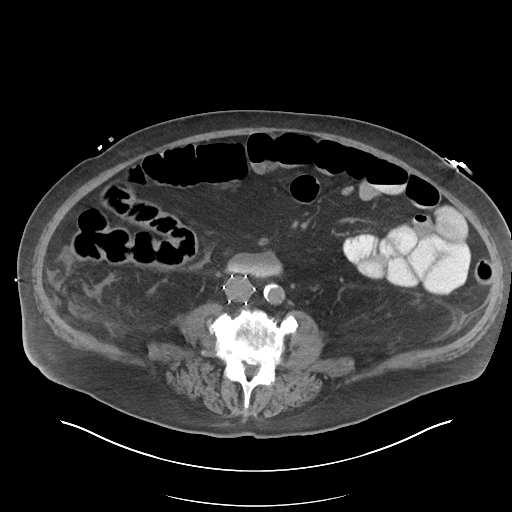
[im 56/102  soft-tissue]
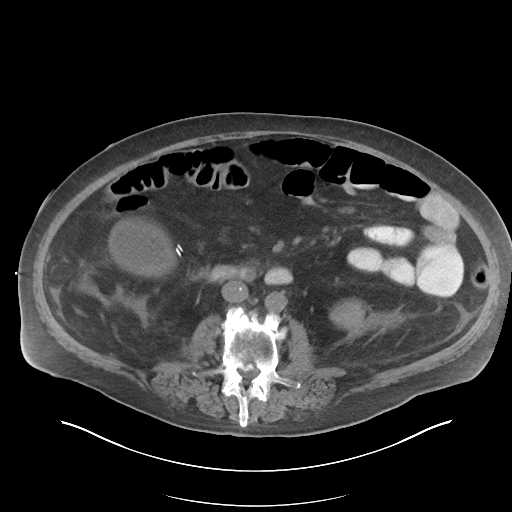
[im 66/102  soft-tissue]
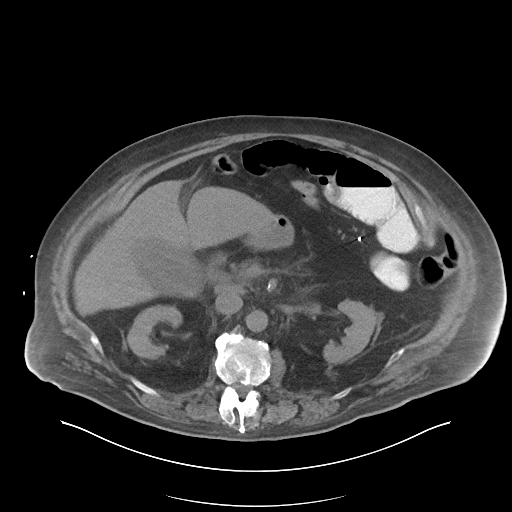
[im 66/102  bone]
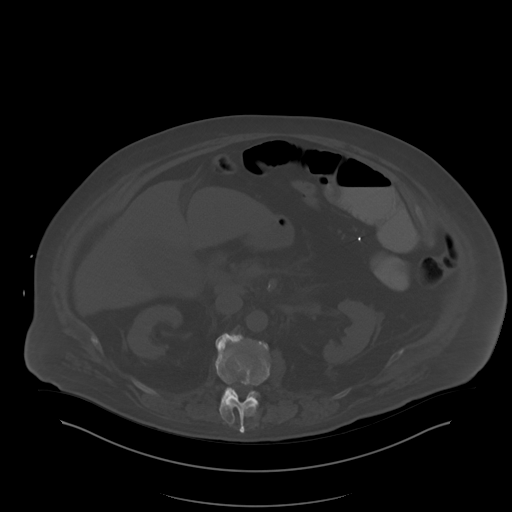
[im 71/102  soft-tissue]
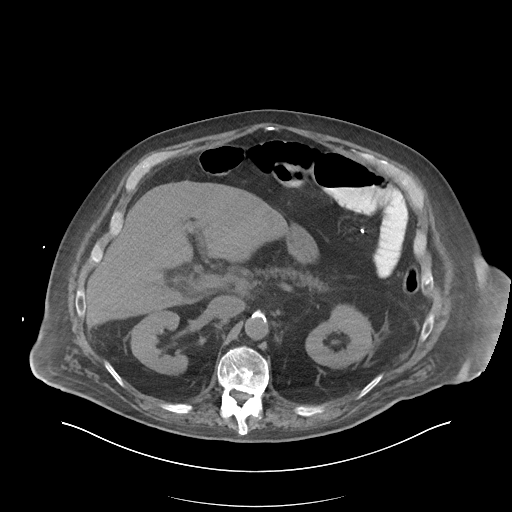
[im 81/102  soft-tissue]
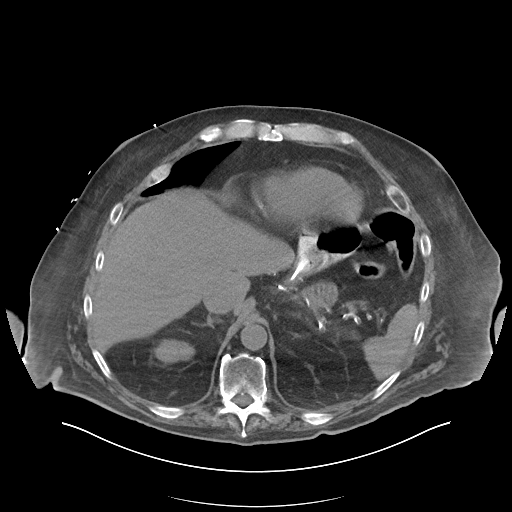
[im 86/102  soft-tissue]
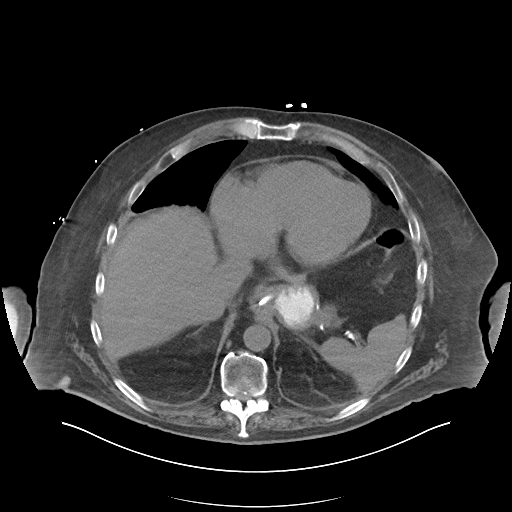
[im 96/102  soft-tissue]
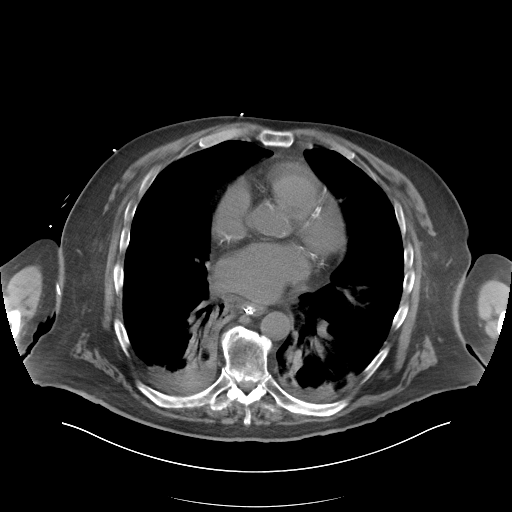

[Series 5: cor · coronal · 0.99mm/px · 3 of 113 slices shown]
[im 38/113  soft-tissue]
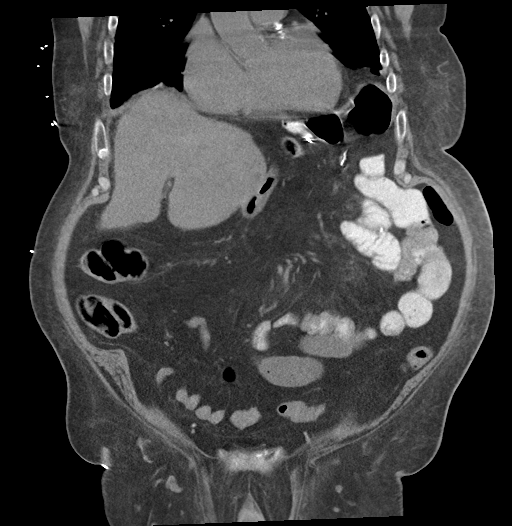
[im 50/113  soft-tissue]
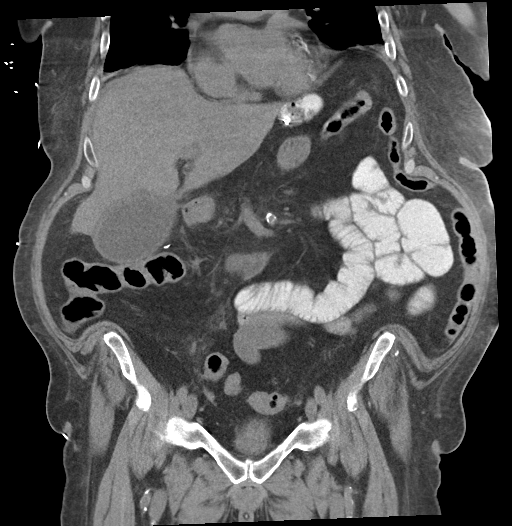
[im 63/113  soft-tissue]
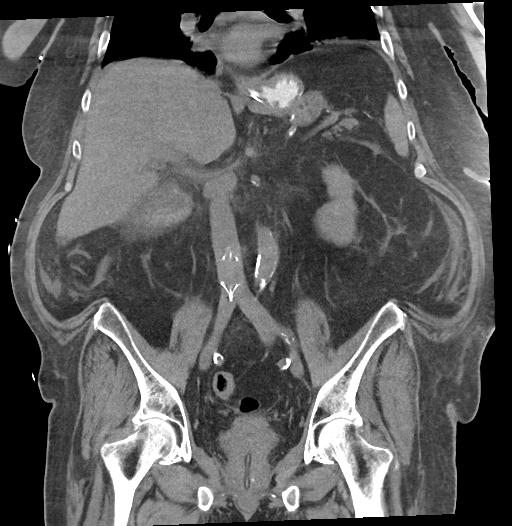

[16 of 46 positions shown; findings below may reference images not displayed]

FINDINGS: Lower chest: Evaluation of the lung bases is limited by respiratory
motion. Bibasilar airspace consolidations. Partially visualized
central venous catheter with tip overlying the superior cavoatrial
junction. Coronary artery calcifications.

Hepatobiliary: Unremarkable noncontrast appearance of the hepatic
parenchyma. No portal venous gas. Gallbladder is distended with
layering cholelithiasis wall thickening and pericholecystic fluid
consistent with acute calculus cholecystitis. No biliary ductal
dilation.

Pancreas: Fatty replacement of the pancreatic parenchyma. No
pancreatic ductal dilation.

Spleen: Within normal limits.

Adrenals/Urinary Tract: Bilateral adrenal glands are unremarkable.
No hydronephrosis. No contour deforming renal masses. Urinary
bladder is decompressed around a Foley catheter.

Stomach/Bowel: Postsurgical changes of gastric bypass. Nasogastric
tube with tip in the Roux limb. Prominent loops of contrast filled
small bowel in the left upper quadrant without abrupt transition to
decompressed bowel. Appendix not visualized. No colonic wall or
adjacent inflammation.

Vascular/Lymphatic: IVC filter. Aortic atherosclerosis without
aneurysmal dilation. No pathologically enlarged abdominal or pelvic
lymph nodes.

Reproductive: Prostate is unremarkable.

Other: Pericholecystic predominant right upper quadrant stranding
with trace fluid in the right pericolic gutter without walled off
collection. No pneumoperitoneum.

Musculoskeletal: Multilevel degenerative changes spine with bony
ankylosis of the thoracolumbar spine. No acute osseous abnormality.
IMPRESSION: 1. Findings consistent with acute calculus cholecystitis. No
evidence of perforation. No biliary ductal dilation.
2. Bibasilar airspace consolidations may represent atelectasis or
pneumonia.
3. Postsurgical changes of gastric bypass. Prominent loops of
contrast filled small bowel in the left upper quadrant without
abrupt transition to decompressed bowel. Findings may represent
ileus.
4. Aortic atherosclerosis.

Aortic Atherosclerosis ([id number]).
